# Patient Record
Sex: Female | Born: 1960 | Race: White | Hispanic: No | Marital: Married | Smoking: Never smoker
Health system: Southern US, Community
[De-identification: ages and names within clinical notes are randomized; demographics above are authoritative.]

## PROBLEM LIST (undated history)

## (undated) DIAGNOSIS — M26609 Unspecified temporomandibular joint disorder, unspecified side: Secondary | ICD-10-CM

## (undated) DIAGNOSIS — F419 Anxiety disorder, unspecified: Secondary | ICD-10-CM

## (undated) DIAGNOSIS — Z9289 Personal history of other medical treatment: Secondary | ICD-10-CM

## (undated) DIAGNOSIS — M1990 Unspecified osteoarthritis, unspecified site: Secondary | ICD-10-CM

## (undated) DIAGNOSIS — G479 Sleep disorder, unspecified: Secondary | ICD-10-CM

## (undated) DIAGNOSIS — IMO0001 Reserved for inherently not codable concepts without codable children: Secondary | ICD-10-CM

## (undated) DIAGNOSIS — M8580 Other specified disorders of bone density and structure, unspecified site: Secondary | ICD-10-CM

## (undated) HISTORY — DX: Unspecified temporomandibular joint disorder, unspecified side: M26.609

## (undated) HISTORY — PX: WISDOM TOOTH EXTRACTION: SHX21

## (undated) HISTORY — DX: Personal history of other medical treatment: Z92.89

## (undated) HISTORY — DX: Other specified disorders of bone density and structure, unspecified site: M85.80

## (undated) HISTORY — DX: Unspecified osteoarthritis, unspecified site: M19.90

## (undated) HISTORY — PX: BREAST SURGERY: SHX581

## (undated) HISTORY — DX: Sleep disorder, unspecified: G47.9

## (undated) HISTORY — DX: Anxiety disorder, unspecified: F41.9

## (undated) HISTORY — PX: MOLE REMOVAL: SHX2046

## (undated) HISTORY — DX: Reserved for inherently not codable concepts without codable children: IMO0001

---

## 2002-03-13 ENCOUNTER — Encounter: Payer: Self-pay | Admitting: Obstetrics and Gynecology

## 2002-03-13 ENCOUNTER — Ambulatory Visit (HOSPITAL_COMMUNITY): Admission: RE | Admit: 2002-03-13 | Discharge: 2002-03-13 | Payer: Self-pay | Admitting: Obstetrics and Gynecology

## 2002-05-26 ENCOUNTER — Inpatient Hospital Stay (HOSPITAL_COMMUNITY): Admission: AD | Admit: 2002-05-26 | Discharge: 2002-05-26 | Payer: Self-pay | Admitting: Obstetrics and Gynecology

## 2002-08-08 ENCOUNTER — Inpatient Hospital Stay (HOSPITAL_COMMUNITY): Admission: AD | Admit: 2002-08-08 | Discharge: 2002-08-10 | Payer: Self-pay | Admitting: Obstetrics and Gynecology

## 2003-10-31 ENCOUNTER — Other Ambulatory Visit: Admission: RE | Admit: 2003-10-31 | Discharge: 2003-10-31 | Payer: Self-pay | Admitting: Obstetrics and Gynecology

## 2004-04-02 ENCOUNTER — Encounter: Admission: RE | Admit: 2004-04-02 | Discharge: 2004-04-02 | Payer: Self-pay | Admitting: Obstetrics and Gynecology

## 2004-04-04 ENCOUNTER — Encounter: Payer: Self-pay | Admitting: Internal Medicine

## 2004-11-11 ENCOUNTER — Other Ambulatory Visit: Admission: RE | Admit: 2004-11-11 | Discharge: 2004-11-11 | Payer: Self-pay | Admitting: Obstetrics and Gynecology

## 2005-01-13 ENCOUNTER — Ambulatory Visit: Payer: Self-pay | Admitting: Internal Medicine

## 2005-05-05 ENCOUNTER — Encounter: Admission: RE | Admit: 2005-05-05 | Discharge: 2005-05-05 | Payer: Self-pay | Admitting: Obstetrics and Gynecology

## 2005-09-02 ENCOUNTER — Ambulatory Visit: Payer: Self-pay | Admitting: Internal Medicine

## 2005-11-13 ENCOUNTER — Other Ambulatory Visit: Admission: RE | Admit: 2005-11-13 | Discharge: 2005-11-13 | Payer: Self-pay | Admitting: Obstetrics and Gynecology

## 2006-01-11 ENCOUNTER — Ambulatory Visit: Payer: Self-pay | Admitting: Internal Medicine

## 2006-01-20 ENCOUNTER — Ambulatory Visit: Payer: Self-pay | Admitting: Internal Medicine

## 2006-02-08 ENCOUNTER — Ambulatory Visit: Payer: Self-pay | Admitting: Gastroenterology

## 2006-11-24 ENCOUNTER — Ambulatory Visit: Payer: Self-pay | Admitting: Internal Medicine

## 2006-11-24 LAB — CONVERTED CEMR LAB
ALT: 16 units/L (ref 0–40)
AST: 23 units/L (ref 0–37)
Albumin: 4.1 g/dL (ref 3.5–5.2)
Alkaline Phosphatase: 31 units/L — ABNORMAL LOW (ref 39–117)
BUN: 17 mg/dL (ref 6–23)
Basophils Absolute: 0 10*3/uL (ref 0.0–0.1)
Basophils Relative: 0.3 % (ref 0.0–1.0)
CO2: 26 meq/L (ref 19–32)
Calcium: 9.1 mg/dL (ref 8.4–10.5)
Chloride: 106 meq/L (ref 96–112)
Chol/HDL Ratio, serum: 2.5
Cholesterol: 144 mg/dL (ref 0–200)
Creatinine, Ser: 0.8 mg/dL (ref 0.4–1.2)
Eosinophil percent: 2.2 % (ref 0.0–5.0)
GFR calc non Af Amer: 82 mL/min
Glomerular Filtration Rate, Af Am: 100 mL/min/{1.73_m2}
Glucose, Bld: 80 mg/dL (ref 70–99)
HCT: 40.4 % (ref 36.0–46.0)
HDL: 56.9 mg/dL (ref >39.0)
Hemoglobin: 13.6 g/dL (ref 12.0–15.0)
LDL Cholesterol: 78 mg/dL (ref 0–99)
Lymphocytes Relative: 24.9 % (ref 12.0–46.0)
MCHC: 33.7 g/dL (ref 30.0–36.0)
MCV: 98.3 fL (ref 78.0–100.0)
Monocytes Absolute: 0.4 10*3/uL (ref 0.2–0.7)
Monocytes Relative: 7.5 % (ref 3.0–11.0)
Neutro Abs: 3.3 10*3/uL (ref 1.4–7.7)
Neutrophils Relative %: 65.1 % (ref 43.0–77.0)
Platelets: 217 10*3/uL (ref 150–400)
Potassium: 3.5 meq/L (ref 3.5–5.1)
RBC: 4.11 M/uL (ref 3.87–5.11)
RDW: 11.7 % (ref 11.5–14.6)
Sodium: 138 meq/L (ref 135–145)
TSH: 2.06 microintl units/mL (ref 0.35–5.50)
Total Bilirubin: 0.9 mg/dL (ref 0.3–1.2)
Total Protein: 6.8 g/dL (ref 6.0–8.3)
Triglyceride fasting, serum: 44 mg/dL (ref 0–149)
VLDL: 9 mg/dL (ref 0–40)
WBC: 5 10*3/uL (ref 4.5–10.5)

## 2006-11-30 ENCOUNTER — Ambulatory Visit: Payer: Self-pay | Admitting: Internal Medicine

## 2007-01-20 ENCOUNTER — Encounter
Admission: RE | Admit: 2007-01-20 | Discharge: 2007-03-09 | Payer: Self-pay | Admitting: Physical Medicine and Rehabilitation

## 2007-03-30 ENCOUNTER — Encounter: Admission: RE | Admit: 2007-03-30 | Discharge: 2007-03-30 | Payer: Self-pay | Admitting: Obstetrics and Gynecology

## 2007-05-03 ENCOUNTER — Ambulatory Visit: Payer: Self-pay | Admitting: Internal Medicine

## 2007-09-16 ENCOUNTER — Telehealth: Payer: Self-pay | Admitting: Internal Medicine

## 2007-09-28 ENCOUNTER — Ambulatory Visit: Payer: Self-pay | Admitting: Internal Medicine

## 2007-09-28 DIAGNOSIS — M26629 Arthralgia of temporomandibular joint, unspecified side: Secondary | ICD-10-CM

## 2007-09-28 DIAGNOSIS — M26649 Arthritis of unspecified temporomandibular joint: Secondary | ICD-10-CM | POA: Insufficient documentation

## 2007-09-28 DIAGNOSIS — K089 Disorder of teeth and supporting structures, unspecified: Secondary | ICD-10-CM | POA: Insufficient documentation

## 2007-09-28 DIAGNOSIS — F4323 Adjustment disorder with mixed anxiety and depressed mood: Secondary | ICD-10-CM | POA: Insufficient documentation

## 2007-09-28 DIAGNOSIS — F419 Anxiety disorder, unspecified: Secondary | ICD-10-CM

## 2007-09-28 DIAGNOSIS — F411 Generalized anxiety disorder: Secondary | ICD-10-CM | POA: Insufficient documentation

## 2007-10-25 ENCOUNTER — Ambulatory Visit: Payer: Self-pay | Admitting: Internal Medicine

## 2008-05-03 ENCOUNTER — Ambulatory Visit: Payer: Self-pay | Admitting: Internal Medicine

## 2008-05-03 DIAGNOSIS — N915 Oligomenorrhea, unspecified: Secondary | ICD-10-CM | POA: Insufficient documentation

## 2008-05-03 LAB — CONVERTED CEMR LAB: Vit D, 1,25-Dihydroxy: 28 — ABNORMAL LOW (ref 30–89)

## 2008-05-11 LAB — CONVERTED CEMR LAB
BUN: 14 mg/dL (ref 6–23)
Basophils Absolute: 0 10*3/uL (ref 0.0–0.1)
Basophils Relative: 0.2 % (ref 0.0–1.0)
CO2: 30 meq/L (ref 19–32)
Calcium: 9.7 mg/dL (ref 8.4–10.5)
Chloride: 105 meq/L (ref 96–112)
Creatinine, Ser: 0.8 mg/dL (ref 0.4–1.2)
Eosinophils Absolute: 0.1 10*3/uL (ref 0.0–0.7)
Eosinophils Relative: 1.3 % (ref 0.0–5.0)
FSH: 33.9 milliintl units/mL
Free T4: 0.8 ng/dL (ref 0.6–1.6)
GFR calc Af Amer: 99 mL/min
GFR calc non Af Amer: 82 mL/min
Glucose, Bld: 90 mg/dL (ref 70–99)
HCT: 40.3 % (ref 36.0–46.0)
Hemoglobin: 13.8 g/dL (ref 12.0–15.0)
Lymphocytes Relative: 30.3 % (ref 12.0–46.0)
MCHC: 34.2 g/dL (ref 30.0–36.0)
MCV: 94.5 fL (ref 78.0–100.0)
Monocytes Absolute: 0.5 10*3/uL (ref 0.1–1.0)
Monocytes Relative: 8.3 % (ref 3.0–12.0)
Neutro Abs: 3.4 10*3/uL (ref 1.4–7.7)
Neutrophils Relative %: 59.9 % (ref 43.0–77.0)
Platelets: 215 10*3/uL (ref 150–400)
Potassium: 3.9 meq/L (ref 3.5–5.1)
RBC: 4.26 M/uL (ref 3.87–5.11)
RDW: 12.4 % (ref 11.5–14.6)
Sodium: 140 meq/L (ref 135–145)
TSH: 2.83 microintl units/mL (ref 0.35–5.50)
WBC: 5.8 10*3/uL (ref 4.5–10.5)

## 2008-05-16 ENCOUNTER — Encounter: Payer: Self-pay | Admitting: Internal Medicine

## 2008-05-16 ENCOUNTER — Ambulatory Visit: Payer: Self-pay | Admitting: Internal Medicine

## 2008-05-30 ENCOUNTER — Ambulatory Visit: Payer: Self-pay | Admitting: Internal Medicine

## 2008-07-30 ENCOUNTER — Telehealth (INDEPENDENT_AMBULATORY_CARE_PROVIDER_SITE_OTHER): Payer: Self-pay | Admitting: *Deleted

## 2008-09-26 ENCOUNTER — Ambulatory Visit: Payer: Self-pay | Admitting: Internal Medicine

## 2008-09-26 DIAGNOSIS — F5101 Primary insomnia: Secondary | ICD-10-CM | POA: Insufficient documentation

## 2008-09-26 DIAGNOSIS — F5104 Psychophysiologic insomnia: Secondary | ICD-10-CM

## 2008-10-01 ENCOUNTER — Telehealth: Payer: Self-pay | Admitting: Internal Medicine

## 2008-10-20 ENCOUNTER — Ambulatory Visit: Payer: Self-pay | Admitting: Family Medicine

## 2008-10-20 DIAGNOSIS — J069 Acute upper respiratory infection, unspecified: Secondary | ICD-10-CM | POA: Insufficient documentation

## 2008-10-22 ENCOUNTER — Telehealth: Payer: Self-pay | Admitting: Internal Medicine

## 2009-01-23 ENCOUNTER — Telehealth: Payer: Self-pay | Admitting: Internal Medicine

## 2009-04-02 ENCOUNTER — Telehealth: Payer: Self-pay | Admitting: Internal Medicine

## 2009-04-09 ENCOUNTER — Ambulatory Visit: Payer: Self-pay | Admitting: Internal Medicine

## 2009-04-09 LAB — CONVERTED CEMR LAB
ALT: 18 units/L (ref 0–35)
AST: 24 units/L (ref 0–37)
Albumin: 3.8 g/dL (ref 3.5–5.2)
Alkaline Phosphatase: 39 units/L (ref 39–117)
BUN: 12 mg/dL (ref 6–23)
Basophils Absolute: 0 10*3/uL (ref 0.0–0.1)
Basophils Relative: 0.7 % (ref 0.0–3.0)
Bilirubin Urine: NEGATIVE
Bilirubin, Direct: 0.1 mg/dL (ref 0.0–0.3)
CO2: 31 meq/L (ref 19–32)
Calcium: 8.6 mg/dL (ref 8.4–10.5)
Chloride: 110 meq/L (ref 96–112)
Cholesterol: 144 mg/dL (ref 0–200)
Creatinine, Ser: 0.8 mg/dL (ref 0.4–1.2)
Eosinophils Absolute: 0.1 10*3/uL (ref 0.0–0.7)
Eosinophils Relative: 3 % (ref 0.0–5.0)
GFR calc non Af Amer: 81.48 mL/min (ref >60)
Glucose, Bld: 74 mg/dL (ref 70–99)
HCT: 38.4 % (ref 36.0–46.0)
HDL: 56.5 mg/dL (ref >39.00)
Hemoglobin: 13.2 g/dL (ref 12.0–15.0)
Ketones, urine, test strip: NEGATIVE
LDL Cholesterol: 77 mg/dL (ref 0–99)
Lymphocytes Relative: 31.2 % (ref 12.0–46.0)
Lymphs Abs: 1.4 10*3/uL (ref 0.7–4.0)
MCHC: 34.3 g/dL (ref 30.0–36.0)
MCV: 97.6 fL (ref 78.0–100.0)
Monocytes Absolute: 0.4 10*3/uL (ref 0.1–1.0)
Monocytes Relative: 8.3 % (ref 3.0–12.0)
Neutro Abs: 2.6 10*3/uL (ref 1.4–7.7)
Neutrophils Relative %: 56.8 % (ref 43.0–77.0)
Nitrite: NEGATIVE
Platelets: 201 10*3/uL (ref 150.0–400.0)
Potassium: 3.8 meq/L (ref 3.5–5.1)
RBC: 3.93 M/uL (ref 3.87–5.11)
RDW: 12.3 % (ref 11.5–14.6)
Sodium: 143 meq/L (ref 135–145)
Specific Gravity, Urine: 1.02
TSH: 2.61 microintl units/mL (ref 0.35–5.50)
Total Bilirubin: 1 mg/dL (ref 0.3–1.2)
Total CHOL/HDL Ratio: 3
Total Protein: 6.7 g/dL (ref 6.0–8.3)
Triglycerides: 55 mg/dL (ref 0.0–149.0)
Urobilinogen, UA: 0.2
VLDL: 11 mg/dL (ref 0.0–40.0)
WBC: 4.5 10*3/uL (ref 4.5–10.5)

## 2009-04-16 ENCOUNTER — Ambulatory Visit: Payer: Self-pay | Admitting: Internal Medicine

## 2009-04-24 ENCOUNTER — Encounter: Payer: Self-pay | Admitting: Internal Medicine

## 2009-06-21 ENCOUNTER — Ambulatory Visit: Payer: Self-pay | Admitting: Internal Medicine

## 2009-06-21 DIAGNOSIS — N390 Urinary tract infection, site not specified: Secondary | ICD-10-CM | POA: Insufficient documentation

## 2009-06-21 LAB — CONVERTED CEMR LAB
Glucose, Urine, Semiquant: NEGATIVE
Nitrite: NEGATIVE
Specific Gravity, Urine: 1.025

## 2009-09-03 ENCOUNTER — Telehealth: Payer: Self-pay | Admitting: *Deleted

## 2009-09-06 ENCOUNTER — Ambulatory Visit: Payer: Self-pay | Admitting: Internal Medicine

## 2009-09-10 ENCOUNTER — Encounter: Payer: Self-pay | Admitting: Internal Medicine

## 2009-09-20 ENCOUNTER — Ambulatory Visit: Payer: Self-pay | Admitting: Family Medicine

## 2009-09-20 DIAGNOSIS — K219 Gastro-esophageal reflux disease without esophagitis: Secondary | ICD-10-CM | POA: Insufficient documentation

## 2009-10-16 ENCOUNTER — Telehealth: Payer: Self-pay | Admitting: Internal Medicine

## 2010-03-14 ENCOUNTER — Telehealth: Payer: Self-pay | Admitting: Internal Medicine

## 2010-04-17 ENCOUNTER — Telehealth: Payer: Self-pay | Admitting: Internal Medicine

## 2010-04-23 ENCOUNTER — Ambulatory Visit: Payer: Self-pay | Admitting: Internal Medicine

## 2010-04-23 DIAGNOSIS — M542 Cervicalgia: Secondary | ICD-10-CM | POA: Insufficient documentation

## 2010-05-09 ENCOUNTER — Telehealth: Payer: Self-pay | Admitting: Internal Medicine

## 2010-05-28 LAB — CONVERTED CEMR LAB: Pap Smear: NORMAL

## 2010-06-17 ENCOUNTER — Ambulatory Visit: Payer: Self-pay | Admitting: Internal Medicine

## 2010-07-17 ENCOUNTER — Ambulatory Visit: Payer: Self-pay | Admitting: Internal Medicine

## 2010-07-17 LAB — CONVERTED CEMR LAB
ALT: 16 units/L (ref 0–35)
AST: 23 units/L (ref 0–37)
Albumin: 4.2 g/dL (ref 3.5–5.2)
Alkaline Phosphatase: 41 units/L (ref 39–117)
BUN: 17 mg/dL (ref 6–23)
Basophils Absolute: 0.1 10*3/uL (ref 0.0–0.1)
Basophils Relative: 1 % (ref 0.0–3.0)
Bilirubin Urine: NEGATIVE
Bilirubin, Direct: 0.2 mg/dL (ref 0.0–0.3)
Blood in Urine, dipstick: NEGATIVE
CO2: 30 meq/L (ref 19–32)
Calcium: 9.6 mg/dL (ref 8.4–10.5)
Chloride: 110 meq/L (ref 96–112)
Cholesterol: 190 mg/dL (ref 0–200)
Creatinine, Ser: 0.8 mg/dL (ref 0.4–1.2)
Eosinophils Absolute: 0.1 10*3/uL (ref 0.0–0.7)
Eosinophils Relative: 2.3 % (ref 0.0–5.0)
GFR calc non Af Amer: 82.23 mL/min (ref >60)
Glucose, Bld: 95 mg/dL (ref 70–99)
Glucose, Urine, Semiquant: NEGATIVE
HCT: 40.8 % (ref 36.0–46.0)
HDL: 66.8 mg/dL (ref >39.00)
Hemoglobin: 14.3 g/dL (ref 12.0–15.0)
Ketones, urine, test strip: NEGATIVE
LDL Cholesterol: 107 mg/dL — ABNORMAL HIGH (ref 0–99)
Lymphocytes Relative: 28.7 % (ref 12.0–46.0)
Lymphs Abs: 1.6 10*3/uL (ref 0.7–4.0)
MCHC: 35 g/dL (ref 30.0–36.0)
MCV: 96.7 fL (ref 78.0–100.0)
Monocytes Absolute: 0.5 10*3/uL (ref 0.1–1.0)
Monocytes Relative: 8.7 % (ref 3.0–12.0)
Neutro Abs: 3.4 10*3/uL (ref 1.4–7.7)
Neutrophils Relative %: 59.3 % (ref 43.0–77.0)
Nitrite: NEGATIVE
Platelets: 220 10*3/uL (ref 150.0–400.0)
Potassium: 5.2 meq/L — ABNORMAL HIGH (ref 3.5–5.1)
Protein, U semiquant: NEGATIVE
RBC: 4.23 M/uL (ref 3.87–5.11)
RDW: 13.5 % (ref 11.5–14.6)
Sodium: 144 meq/L (ref 135–145)
Specific Gravity, Urine: 1.02
TSH: 3.94 microintl units/mL (ref 0.35–5.50)
Total Bilirubin: 0.8 mg/dL (ref 0.3–1.2)
Total CHOL/HDL Ratio: 3
Total Protein: 7.2 g/dL (ref 6.0–8.3)
Triglycerides: 81 mg/dL (ref 0.0–149.0)
Urobilinogen, UA: 0.2
VLDL: 16.2 mg/dL (ref 0.0–40.0)
Vit D, 25-Hydroxy: 42 ng/mL (ref 30–89)
WBC Urine, dipstick: NEGATIVE
WBC: 5.7 10*3/uL (ref 4.5–10.5)
pH: 7

## 2010-07-22 ENCOUNTER — Ambulatory Visit: Payer: Self-pay | Admitting: Internal Medicine

## 2010-10-09 ENCOUNTER — Ambulatory Visit: Payer: Self-pay | Admitting: Internal Medicine

## 2010-10-09 ENCOUNTER — Encounter: Payer: Self-pay | Admitting: Internal Medicine

## 2010-10-20 ENCOUNTER — Telehealth: Payer: Self-pay | Admitting: *Deleted

## 2011-01-20 ENCOUNTER — Other Ambulatory Visit: Payer: Self-pay | Admitting: Obstetrics and Gynecology

## 2011-01-20 DIAGNOSIS — Z1239 Encounter for other screening for malignant neoplasm of breast: Secondary | ICD-10-CM

## 2011-01-20 DIAGNOSIS — Z1231 Encounter for screening mammogram for malignant neoplasm of breast: Secondary | ICD-10-CM

## 2011-01-25 LAB — CONVERTED CEMR LAB: Pap Smear: NORMAL

## 2011-01-27 NOTE — Assessment & Plan Note (Signed)
Summary: fu on meds/?eye/njr   Vital Signs:  Patient profile:   50 year old female Menstrual status:  perimenopausal LMP:     02/11/2010 Weight:      118 pounds Pulse rate:   60 / minute BP sitting:   100 / 70  (right arm) Cuff size:   regular  Vitals Entered By: Romualdo Bolk, CMA Duncan Dull) (April 23, 2010 10:49 AM) CC: Follow-up visit on meds- Pt would like a refill on ativan LMP (date): 02/11/2010 Menarche (age onset years): 62    Enter LMP: 02/11/2010 Last PAP Result normal   History of Present Illness: Christine Ford comesin comes in today  for follow up of eye rx   and doing well.   See Phone note .   had eye infection out of town and important to keep  infection from her son who is blind but some vision withone eye glaucomatous.  Requests refills of meds   if needed  Mood  : lexapro  anxiety  helps    ocass   forgets     not taking the ativan     but needs current  rx in case still taking the lunesta . Teeth still  a problem but coping.   Very busty family responsibilities.  Plans on follow up check up when possible. No new problems otherwise.   Preventive Screening-Counseling & Management  Alcohol-Tobacco     Alcohol drinks/day: <1     Alcohol type: wine     Smoking Status: never  Caffeine-Diet-Exercise     Caffeine use/day: 2     Does Patient Exercise: no  Current Medications (verified): 1)  Lunesta 3 Mg Tabs (Eszopiclone) .Marland Kitchen.. 1 By Mouth At Bedtime 2)  Lexapro 5 Mg Tabs (Escitalopram Oxalate) .... Take 1 Tablet By Mouth Once A Day 3)  Ativan 0.5 Mg Tabs (Lorazepam) .Marland Kitchen.. 1 By Mouth  Hs or Two Times A Day For  Sleep and Anxiety. 4)  Tums 500 Mg Chew (Calcium Carbonate Antacid)  Allergies (verified): No Known Drug Allergies  Past History:  Past medical, surgical, family and social histories (including risk factors) reviewed for relevance to current acute and chronic problems.  Past Medical History: Reviewed history from 09/26/2008 and no changes  required. 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   ? sleep phase  difficulty.   Past Surgical History: Reviewed history from 09/28/2007 and no changes required. Breast Implants  Past History:  Care Management: Gynecology: Dr. Senaida Ores Cardiology: remote consult [name] for cardiology Gastroenterology: [facility]    Family History: Reviewed history from 04/16/2009 and no changes required. cad father age 56 uncle age 83  see paper chart son with congenital glaucoma  has shunt in eye.  bipolar.   pgm 49 ? DVT    Family History High cholesterol Family History Hypertension    Social History: Reviewed history from 09/06/2009 and no changes required. Occupation: Armed forces operational officer  Married Never Smoked hh of 5       not alot of exercise . ocass wine   Some caffiene  water   issues in new house   used to have cats.  well water.    Review of Systems  The patient denies anorexia, fever, vision loss, decreased hearing, transient blindness, difficulty walking, enlarged lymph nodes, angioedema, and muscle weakness.         some neck pain at times  stiff at times  family hx of neck arthritis and no neuro problems or weakness   Physical Exam  General:  Well-developed,well-nourished,in no acute distress; alert,appropriate and cooperative throughout examination Head:  normocephalic and atraumatic.   Eyes:  vision grossly intact, pupils equal, and pupils round.  no sig discharge today    Ears:  R ear normal, L ear normal, and no external deformities.   Nose:  no nasal discharge.   Mouth:  pharynx pink and moist.   Neck:  no masses.  somestiffness and  postural foreshortening  Msk:  no focal atrophy  Neurologic:  alert & oriented X3, strength normal in all extremities, and gait normal.   Skin:  turgor normal and color normal.   Cervical Nodes:  No lymphadenopathy noted Psych:   Oriented X3, normally interactive, good eye contact, not anxious appearing, and not depressed appearing.  looks tired but nl cognition   Impression & Recommendations:  Problem # 1:  CONJUNCTIVITIS (ICD-372.30)  The following medications were removed from the medication list:    Polytrim 10000-0.1 Unit/ml-% Soln (Polymyxin b-trimethoprim) .Marland Kitchen... 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 1 by mouth at bedtime 2)  Lexapro 5 Mg Tabs (Escitalopram oxalate) .... Take 1 tablet by mouth once a day 3)  Ativan 0.5 Mg Tabs (Lorazepam) .Marland Kitchen.. 1 by mouth  hs or two times a day for  sleep and anxiety. 4)  Tums 500 Mg Chew (Calcium carbonate antacid)  Patient Instructions: 1)  ok to continue on the meds you ar on .    2)  no further rx on the eye. 3)  Look at  your posture and work stations regarding your neck.   Prescriptions: ATIVAN 0.5 MG TABS (LORAZEPAM) 1 by mouth  hs or two times a day for  sleep and anxiety.  #30 x 1   Entered and Authorized by:   Madelin Headings MD   Signed by:   Madelin Headings MD on 04/23/2010   Method used:   Print then Give to Patient   RxID:   [id number]

## 2011-01-27 NOTE — Assessment & Plan Note (Signed)
Summary: cpx/no pap/njr   Vital Signs:  Patient profile:   50 year old female Menstrual status:  perimenopausal Height:      64.5 inches Weight:      120 pounds Pulse rate:   66 / minute BP sitting:   120 / 80  (right arm) Cuff size:   regular  Vitals Entered By: Romualdo Bolk, CMA (AAMA) (July 22, 2010 9:13 AM) CC: CPX-no pap- Pt has a gyn   History of Present Illness: Christine Ford comes in today  for preventive visit .  Since last visit  here  there have been no major changes in health status       Stable on jaw  current regimen.  Some menopausal signs . Lunesta as needed  and   now not taking   for now in the summer  ...  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     Preventive Care Screening  Pap Smear:    Date:  05/28/2010    Results:  normal   Prior Values:    Pap Smear:  normal (10/29/2007)    Mammogram:  normal (12/28/2006)    Last Tetanus Booster:  Historical (12/03/2006)   Preventive Screening-Counseling & Management  Alcohol-Tobacco     Alcohol drinks/day: <1     Alcohol type: wine     Smoking Status: never  Caffeine-Diet-Exercise     Caffeine use/day: 2     Does Patient Exercise: no  Hep-HIV-STD-Contraception     Dental Visit-last 6 months yes     Sun Exposure-Excessive: no     Sun Exposure Counseling: use suncscreen  Safety-Violence-Falls     Seat Belt Use: 100     Smoke Detectors: yes  Current Medications (verified): 1)  Lunesta 3 Mg Tabs (Eszopiclone) .Marland Kitchen.. 1 By Mouth At Bedtime 2)  Lexapro 5 Mg Tabs (Escitalopram Oxalate) .Marland Kitchen.. 1 By Mouth Once Daily 3)  Tums 500 Mg Chew (Calcium Carbonate Antacid)  Allergies (verified): No Known Drug Allergies  Past History:  Past medical, surgical, family and social histories (including risk factors) reviewed, and no changes noted (except as noted below).  Past Medical History: Reviewed history from 09/26/2008 and no changes  required. 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   ? sleep phase  difficulty.   Past Surgical History: Reviewed history from 09/28/2007 and no changes required. Breast Implants  Past History:  Care Management: Gynecology: Dr. Senaida Ores Cardiology: remote consult [name] for cardiology Gastroenterology: [facility]    Family History: Reviewed history from 04/23/2010 and no changes required. cad father age 60 uncle age 33  see paper chart son with congenital glaucoma  has shunt in eye.  bipolar.   pgm 49 ? DVT   Son with vitiligo Family History High cholesterol Family History Hypertension    Social History: Reviewed history from 09/06/2009 and no changes required. Occupation: Armed forces operational officer  Married Never Smoked hh of 5       not alot of exercise . ocass wine   Some caffiene  water   issues in new house   used to have cats.  well water.  Dental Care w/in 6 mos.:  yes Sun Exposure-Excessive:  no  Review of Systems  The patient denies anorexia, fever, weight loss, weight gain, vision loss, decreased hearing, hoarseness, chest pain, syncope, dyspnea on  exertion, peripheral edema, prolonged cough, headaches, hemoptysis, abdominal pain, melena, hematochezia, severe indigestion/heartburn, hematuria, muscle weakness, difficulty walking, abnormal bleeding, enlarged lymph nodes, angioedema, and breast masses.         big toes  snaps out ans shoulder pop and crack.   Physical Exam  General:  Well-developed,well-nourished,in no acute distress; alert,appropriate and cooperative throughout examination Head:  normocephalic and atraumatic.   Eyes:  PERRL, EOMs full, conjunctiva clear  Ears:  R ear normal, L ear normal, and no external deformities.   Nose:  no nasal discharge.  no external deformity and no external erythema.   Mouth:  pharynx pink and moist.   Neck:  No  deformities, masses, or tenderness noted. Breasts:  No mass, nodules, thickening, tenderness, bulging, retraction, inflamation, nipple discharge or skin changes noted.    breast implants  Lungs:  Normal respiratory effort, chest expands symmetrically. Lungs are clear to auscultation, no crackles or wheezes. Heart:  Normal rate and regular rhythm. S1 and S2 normal without gallop, murmur, click, rub or other extra sounds.no lifts.   Abdomen:  Bowel sounds positive,abdomen soft and non-tender without masses, organomegaly or   noted. Genitalia:  per gyne Msk:  no joint swelling, no joint warmth, and no redness over joints.  toes look alighned  no atrophy Pulses:  pulses intact without delay   Extremities:  no clubbing cyanosis or edema  Neurologic:  alert & oriented X3, strength normal in all extremities, gait normal, and DTRs symmetrical and normal.   Skin:  no ecchymoses and no petechiae.   Cervical Nodes:  No lymphadenopathy noted Axillary Nodes:  No palpable lymphadenopathy Inguinal Nodes:  No significant adenopathy Psych:  Normal eye contact, appropriate affect. Cognition appears normal.  labs reviewed  copy to pt   Impression & Recommendations:  Problem # 1:  PREVENTIVE HEALTH CARE (ICD-V70.0) Discussed nutrition,exercise,diet,healthy weight, vitamin D and calcium. .   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    call if consider to restart medication   Complete Medication List: 1)  Lunesta 3 Mg Tabs (Eszopiclone) .Marland Kitchen.. 1 by mouth at bedtime 2)  Lexapro 5 Mg Tabs (Escitalopram oxalate) .Marland Kitchen.. 1 by mouth once daily 3)  Tums 500 Mg Chew (Calcium carbonate antacid)  Patient Instructions: 1)  check yearly    with labs   . 2)  call for refillls if needed  . 3)  continue healthy lifestyle intervention .

## 2011-01-27 NOTE — Progress Notes (Signed)
Summary: ? pink eye  Phone Note Call from Patient Call back at Home Phone [phone]   Caller: Patient Summary of Call: Pt is having pink eye out of town- Pt [facility] [place], [place] [phone]. Pt woke up this am both eyes were matted shut, eyes dry and scratchy. Left eye is red. No fever. Initial call taken by: Romualdo Bolk, CMA Duncan Dull),  April 17, 2010 10:43 AM  Follow-up for Phone Call        Per Dr. Fabian Sharp- Polytrim 1 gtt both eyes qid.  Rx called in and pt aware. Follow-up by: Romualdo Bolk, CMA (AAMA),  April 17, 2010 11:06 AM    New/Updated Medications: POLYTRIM 10000-0.1 UNIT/ML-% SOLN (POLYMYXIN B-TRIMETHOPRIM) 1 gtt in each eye qid Prescriptions: POLYTRIM 10000-0.1 UNIT/ML-% SOLN (POLYMYXIN B-TRIMETHOPRIM) 1 gtt in each eye qid  #1 x 0   Entered by:   Romualdo Bolk, CMA (AAMA)   Authorized by:   Madelin Headings MD   Signed by:   Romualdo Bolk, CMA (AAMA) on 04/17/2010   Method used:   Telephoned to ...       [facility]  [place] [address] (retail)       [address]       [place], [place]  [address]       Ph: [phone] or [phone]       Fax: [phone]   RxID:   [id number]

## 2011-01-27 NOTE — Miscellaneous (Signed)
Summary: BONE DENSITY  Clinical Lists Changes  Orders: Added new Test order of T-Bone Densitometry (77080) - Signed Added new Test order of T-Lumbar Vertebral Assessment (77082) - Signed 

## 2011-01-27 NOTE — Progress Notes (Signed)
Summary: refill on lunesta  Phone Note From Pharmacy   [facility]  [place] [phone]* Reason for Call: Needs renewal Details for Reason: Lunesta 3mg  Summary of Call: Last filled on 07/17/10 #30 Initial call taken by: Romualdo Bolk, CMA (AAMA),  October 20, 2010 10:59 AM  Follow-up for Phone Call        ok x 3 Follow-up by: Madelin Headings MD,  October 20, 2010 12:25 PM  Additional Follow-up for Phone Call Additional follow up Details #1::        Faxed back to pharmacy Additional Follow-up by: Romualdo Bolk, CMA Duncan Dull),  October 20, 2010 2:24 PM    Prescriptions: LUNESTA 3 MG TABS (ESZOPICLONE) 1 by mouth at bedtime  #30 x 2   Entered by:   Romualdo Bolk, CMA (AAMA)   Authorized by:   Madelin Headings MD   Signed by:   Romualdo Bolk, CMA (AAMA) on 10/20/2010   Method used:   Handwritten   RxID:   [id number]

## 2011-01-27 NOTE — Progress Notes (Signed)
Summary: refills  Phone Note Call from Patient   Caller: Patient Call For: Christine Headings MD Summary of Call: Pt is asking to refill Lunesta and Lexapro at [facility]) Pt number is [phone] Initial call taken by: [place] [facility] CMA,  March 14, 2010 9:15 AM  Follow-up for Phone Call        ok to refill each x 3   . schedule OV before refills run out. Follow-up by: Christine Headings MD,  March 14, 2010 11:08 AM    Prescriptions: LEXAPRO 5 MG TABS (ESCITALOPRAM OXALATE) Take 1 tablet by mouth once a day  #30 x 2   Entered by:   Lynann Beaver CMA   Authorized by:   Christine Headings MD   Signed by:   Lynann Beaver CMA on 03/14/2010   Method used:   Telephoned to ...       [facility]  [place] [address] (retail)       [address]       [place], [place]  [address]       Ph: [phone] or [phone]       Fax: [phone]   RxID:   [id number] LUNESTA 3 MG TABS (ESZOPICLONE) 1 by mouth at bedtime  #30 x 3   Entered by:   Lynann Beaver CMA   Authorized by:   Christine Headings MD   Signed by:   Lynann Beaver CMA on 03/14/2010   Method used:   Telephoned to ...       [facility]  [place] [address] (retail)       [address]       [place], [place]  [address]       Ph: [phone] or [phone]       Fax: [phone]   RxID:   [id number]

## 2011-01-27 NOTE — Progress Notes (Signed)
Summary: increase lexapro to 10mg   Phone Note Call from Patient Call back at Home Phone [phone]   Caller: Patient Summary of Call: Pt states that lexapro 5mg  is not effective and would like to increase to 10mg . Pt did take 2 of the 5mg  and it seemed to help more. Can we call in a rx for lexapro 10mg ? Initial call taken by: Romualdo Bolk, CMA Duncan Dull),  May 09, 2010 12:53 PM  Follow-up for Phone Call        Per Dr. Fabian Sharp- Okay to increase and have pt follow up in 3-4 weeks. Rx sent to pharmacy and left message on pt's machine about this.    New/Updated Medications: LEXAPRO 10 MG TABS (ESCITALOPRAM OXALATE) 1 by mouth once daily Prescriptions: LEXAPRO 10 MG TABS (ESCITALOPRAM OXALATE) 1 by mouth once daily  #30 x 0   Entered by:   Romualdo Bolk, CMA (AAMA)   Authorized by:   Madelin Headings MD   Signed by:   Romualdo Bolk, CMA (AAMA) on 05/09/2010   Method used:   Electronically to        [facility]  [place] [address] (retail)       [address]       [place], [place]  [address]       Ph: [phone] or [phone]       Fax: [phone]   RxID:   [id number]

## 2011-01-27 NOTE — Assessment & Plan Note (Signed)
Summary: 2 MTH F/U // RS   Vital Signs:  Patient profile:   50 year old female Menstrual status:  perimenopausal Height:      64.25 inches Weight:      118 pounds BMI:     20.17 Pulse rate:   72 / minute BP sitting:   110 / 60  (right arm) Cuff size:   regular  Vitals Entered By: Romualdo Bolk, CMA (AAMA) (June 17, 2010 2:13 PM) CC: Follow-up visit on lexapro- Pt states that 10mg  is too strong. It made her head feel foggy and it upset her stomach. Pt only takes it when she is under stress with the kids.   History of Present Illness: Christine Ford comes in today   for follow up  meds. See phone note  . tried  increase dosing   and burpy and had sedation  so stopped after a    couple days.   ortho dontic problem  is  stable   using bite guard  per orthodontist.  considering braces.  this helps . Thinks the sleep and jaw problem aggravates the anxiety when flares.  currently stable   Perimenopausal  per Gyne and this could contribute also.  NO new medical problems. Due to get a vit d done   per gyne but asks to get with out labs Using lunesta as needed gets bad tase but works well.   Preventive Screening-Counseling & Management  Alcohol-Tobacco     Alcohol drinks/day: <1     Alcohol type: wine     Smoking Status: never  Caffeine-Diet-Exercise     Caffeine use/day: 2     Does Patient Exercise: no  Current Medications (verified): 1)  Lunesta 3 Mg Tabs (Eszopiclone) .Marland Kitchen.. 1 By Mouth At Bedtime 2)  Lexapro 5 Mg Tabs (Escitalopram Oxalate) .Marland Kitchen.. 1 By Mouth Once Daily 3)  Tums 500 Mg Chew (Calcium Carbonate Antacid)  Allergies (verified): No Known Drug Allergies  Past History:  Past medical, surgical, family and social histories (including risk factors) reviewed for relevance to current acute and chronic problems.  Past Medical History: Reviewed history from 09/26/2008 and no changes required. 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   ? sleep phase  difficulty.   Past Surgical History: Reviewed history from 09/28/2007 and no changes required. Breast Implants  Past History:  Care Management: Gynecology: Dr. Senaida Ores Cardiology: remote consult [name] for cardiology [facility]astroenterology: [facility]    Family History: Reviewed history from 04/23/2010 and no changes required. cad father age 10 uncle age 51  see paper chart son with congenital glaucoma  has shunt in eye.  bipolar.   pgm 49 ? DVT    Family History High cholesterol Family History Hypertension    Social History: Reviewed history from 09/06/2009 and no changes required. Occupation: Armed forces operational officer  Married Never Smoked hh of 5       not alot of exercise . ocass wine   Some caffiene  water   issues in new house   used to have cats.  well water.    Review of Systems  The patient denies anorexia, fever, weight loss, weight gain, vision loss, prolonged cough, abdominal pain, difficulty walking, abnormal bleeding, and enlarged lymph nodes.    Physical Exam  General:  alert, well-developed, well-nourished, and well-hydrated.   Head:  normocephalic and atraumatic.   Eyes:  vision grossly intact,  pupils equal, and pupils round.   Neck:  no masses.  somestiffness and  postural foreshortening  Lungs:  Normal respiratory effort, chest expands symmetrically. Lungs are clear to auscultation, no crackles or wheezes. Heart:  Normal rate and regular rhythm. S1 and S2 normal without gallop, murmur, click, rub or other extra sounds. Abdomen:  Bowel sounds positive,abdomen soft and non-tender without masses, organomegaly or hernias noted. Pulses:  nl cap refill  Neurologic:  non focal  Psych:  Oriented X3, normally interactive, good eye contact, not anxious appearing, and not depressed appearing.  looks tired but nl cognition   Impression & Recommendations:  Problem # 1:  DSORD, ADJST  W/MIXED ANXIETY/DEPRESSED MOOD (ICD-309.28) Assessment Improved ok  to remain on the 5 mg    lexapro  if needed consider 7.5 mg  per day .   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  Problem # 5:  se of med  dose related   disc options  Complete Medication List: 1)  Lunesta 3 Mg Tabs (Eszopiclone) .Marland Kitchen.. 1 by mouth at bedtime 2)  Lexapro 5 Mg Tabs (Escitalopram oxalate) .Marland Kitchen.. 1 by mouth once daily 3)  Tums 500 Mg Chew (Calcium carbonate antacid)  Patient Instructions: 1)  ok to continue 5 mg of lexapro  2)  If needed consider trying 7.5 mg  dose. 3)  Optimize sleep and exercise .    4)  should get VIT D  level with next  cpx labs .dx osteopenia.

## 2011-02-02 ENCOUNTER — Ambulatory Visit
Admission: RE | Admit: 2011-02-02 | Discharge: 2011-02-02 | Disposition: A | Discharge status: Home / Self Care | Payer: BC Managed Care – PPO | Source: Ambulatory Visit | Attending: Obstetrics and Gynecology | Admitting: Obstetrics and Gynecology

## 2011-02-02 DIAGNOSIS — Z1231 Encounter for screening mammogram for malignant neoplasm of breast: Secondary | ICD-10-CM

## 2011-03-30 ENCOUNTER — Encounter: Payer: Self-pay | Admitting: Internal Medicine

## 2011-03-31 ENCOUNTER — Ambulatory Visit (INDEPENDENT_AMBULATORY_CARE_PROVIDER_SITE_OTHER): Payer: BC Managed Care – PPO | Admitting: Internal Medicine

## 2011-03-31 ENCOUNTER — Encounter: Payer: Self-pay | Admitting: Internal Medicine

## 2011-03-31 VITALS — BP 120/80 | HR 66 | Temp 98.8°F | Wt 117.0 lb

## 2011-03-31 DIAGNOSIS — H109 Unspecified conjunctivitis: Secondary | ICD-10-CM

## 2011-03-31 MED ORDER — POLYMYXIN B-TRIMETHOPRIM 10000-0.1 UNIT/ML-% OP SOLN: 1.0000 [drp] | OPHTHALMIC | Status: AC

## 2011-03-31 NOTE — Patient Instructions (Signed)
Continue compresses Antibiotic drops as discussed .  Expect improvement in 3-5 days .

## 2011-03-31 NOTE — Progress Notes (Signed)
  Subjective:    Patient ID: Christine Ford, female    DOB: 10-28-1961, 50 y.o.   MRN: [id number]  HPI Hx of dry eyes at times.However over the last day or two she's had more problems with her left eye. She felt like something was in it yesterday and used in eyewash which improved the problem however she has had some crusty discharge in the morning and continued redness. Denies pain or significant itching.  No fever or URI symptoms.. Pollen very thick  Recently but no allergy sx .  Concern of course is because her son who has a congenital glaucoma and limited vision and only one guy trying to avoid contagion.   Review of Systems No fever or respiratory infection see above    Objective:   Physical Exam Well-developed well-nourished and no acute distress with some mild left conjunctival redness. HEENT: Normocephalic ;atraumatic , Eyes;  PERRL, EOMs  Full   No photophobia   1 + redness on conjunctiva  Left and no FM seen No ciliary flush  Slight mucoid discharge ,,Ears: no deformities, canals nl, TM landmarks normal, Nose: no deformity or discharge  Mouth : OP clear without lesion or edema . NECK :no adenopathy .     Assessment & Plan:  Left conjunctivitis ? Reactive vs infection    REviewed  Findings  And diff dx    Compresses and can do empiric rx  Because o high risk situation with her son. Will continue  good hygiene as usual

## 2011-04-03 ENCOUNTER — Encounter: Payer: Self-pay | Admitting: Internal Medicine

## 2011-04-28 ENCOUNTER — Telehealth: Payer: Self-pay | Admitting: *Deleted

## 2011-04-28 NOTE — Telephone Encounter (Signed)
Pt called to state that she is doing a Walk Run program. She is going to work up to 3 miles in 8 weeks. They told her to let her primary care know about this.

## 2011-04-30 NOTE — Telephone Encounter (Signed)
Noted. No concerns.

## 2011-05-15 NOTE — Discharge Summary (Signed)
NAME:  Christine Ford, Christine Ford                        ACCOUNT NO.:  [id number]   MEDICAL RECORD NO.:  [id number]                   PATIENT TYPE:  INP   LOCATION:  9107                                 FACILITY:  [facility]   PHYSICIAN:  Malachi Pro. Ambrose Mantle, M.D.              DATE OF BIRTH:  Dec 19, 1961   DATE OF ADMISSION:  08/08/2002  DATE OF DISCHARGE:  08/10/2002                                 DISCHARGE SUMMARY   DISCHARGE DIAGNOSES:  1. Term pregnancy at 39+ weeks, delivered.  2. Advanced maternal age.  3. Status post normal spontaneous vaginal delivery.   DISCHARGE MEDICATIONS:  Motrin 600 mg p.o. q.6h.   DISCHARGE FOLLOWUP:  The patient is to follow up for routine postpartum  examination in six weeks.   HOSPITAL COURSE:  The patient is a 50 year old G3, P2-0-0-2 who is admitted  at 39+ weeks for induction due to favorable cervix.  Pregnancy had been  complicated by advanced maternal age; however, the patient declined  amniocentesis.  Her Down syndrome risk by triple screen was 1 out of 155.  There was also a family history of neural tube defect but the patient's  triple screen in respect to open neural tube defect was completely within  normal limits and the ultrasound also was within normal limits.   PRENATAL LABORATORIES:  O-.  Antibody negative.  RPR nonreactive.  Rubella  immune.  Hepatitis B surface antigen negative.  HIV declined.  GC negative.  Chlamydia negative.  GBS negative.   PAST OBSTETRIC HISTORY:  In 1996 she had normal spontaneous vaginal  delivery.  In 1999 she had normal spontaneous vaginal delivery.   PAST GYNECOLOGIC HISTORY:  None.   PAST SURGICAL HISTORY:  Breast implants in 1989.   PAST MEDICAL HISTORY:  None.   ALLERGIES:  No known drug allergies.   MEDICATIONS:  None.   PHYSICAL EXAMINATION:  VITAL SIGNS:  The patient was afebrile with stable  vital signs on admission.  Fetal heart rate was reactive.  ABDOMEN:  She was gravid and nontender.  PELVIC:   Cervix was 50% effaced, 1+ cm, and -2 station.   HOSPITAL COURSE:  Ruptured membranes was attempted with only several drops  of fluid obtained so the patient was begun on Pitocin per protocol.  She  began to progress throughout the day and when she reached 3 cm was felt to  have an intact four bag.  This was ruptured with a large amount of clear  fluid obtained.  Shortly thereafter the patient reached complete dilation.  Under epidural anesthesia pushed the vertex to a +3 station.  At that time  fetal heart rate was noted to be 80-90 with no recovery noted, deceleration  after her contraction.  Therefore, the KIWI self applied vacuum was applied  and the vertex easily brought to crowning with minimal traction.  The  patient then pushed the remainder of the head out without difficulty.  Apgars were 8 and 9.  Weight was 7 pounds 6 ounces.  There was terminal  meconium noted.  However, no nuchal cord.  The placenta did not separate  after 20 minutes so manual extraction was performed, then the uterus fundus  was confirmed clear of any placental fragments or trailing membranes.  Small  first degree laceration was repaired with 2-0 Vicryl for hemostasis and  estimated blood loss was 300 cc.  The patient then was very unsure regarding  her tubal ligation whether to proceed or not.  She was counseled extensively  as to the risks and benefits of the procedure and eventually declined to  continue and undergo that procedure.  She did very well postpartum and was  afebrile postpartum day two with no problems and was discharged home with  Motrin.     Huel Cote, NP                      Malachi Pro. Ambrose Mantle, M.D.    KR/MEDQ  D:  08/10/2002  T:  08/10/2002  Job:  [id number]

## 2011-05-15 NOTE — Assessment & Plan Note (Signed)
[facility]                                 ON-CALL NOTE   MUNIRAH, DOERNER                       MRN:          [id number]  DATE:07/23/2007                            DOB:          12-22-1961    Time received:  July 23, 2007, 12:41 p.m.  Patient:  Christine Ford.  Caller is the same.  She sees Dr. Fabian Sharp.  Date of birth:  July 31, 1960.  Telephone:  [phone].  Caller says that the whole family has head  lice.  We told her to purchase Rid over-the-counter shampoo, to use this  for everyone today and to repeat it again tomorrow.  She can follow up  as needed.     Tera Mater. Clent Ridges, MD  Electronically Signed    SAF/MedQ  DD: 07/23/2007  DT: 07/24/2007  Job #: [id number]

## 2011-06-16 ENCOUNTER — Encounter: Payer: Self-pay | Admitting: Internal Medicine

## 2011-06-16 ENCOUNTER — Ambulatory Visit (INDEPENDENT_AMBULATORY_CARE_PROVIDER_SITE_OTHER): Payer: BC Managed Care – PPO | Admitting: Internal Medicine

## 2011-06-16 VITALS — BP 108/68 | HR 56 | Temp 98.6°F | Resp 12 | Wt 116.5 lb

## 2011-06-16 DIAGNOSIS — R0789 Other chest pain: Secondary | ICD-10-CM

## 2011-06-16 DIAGNOSIS — M79629 Pain in unspecified upper arm: Secondary | ICD-10-CM

## 2011-06-16 DIAGNOSIS — M79609 Pain in unspecified limb: Secondary | ICD-10-CM

## 2011-06-16 DIAGNOSIS — R071 Chest pain on breathing: Secondary | ICD-10-CM

## 2011-06-16 NOTE — Patient Instructions (Addendum)
GET X RAY OF CHEST AND SHOULDER Take antiinflammatory doses  For 10-14 days.    2 aleve  2 x per day   If not better in about 2 weeks then call and we may do referral or other imaging.

## 2011-06-16 NOTE — Progress Notes (Signed)
  Subjective:    Patient ID: Christine Ford, female    DOB: 1961/09/17, 50 y.o.   MRN: [id number]  HPI Patient comes in for an acute problem. Insidious onset months ago of pain described as above right axillary radiating to side of breast and throbs and aches and aware daily but  Waxes an wanes and getting worse.   Worse with palpation and some at night  Not worse with acitivity . She is right-handed    No injuries   No numbness or weakness.  Tried advil  Prn   2 and no help. She is worried about breast problem normal mammogram. She doesn't feel a mass.  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  No weakness or  tremor of her right upper extremity  Perimenopausal  insomnia for which she takes Lunesta as needed. 2 mg  Rest of ros neg sob cough fever weight loss or adenopathy or bleeding  Past history family history social history reviewed in the electronic medical record.     Objective:   Physical Exam Well-developed well-nourished in no acute distress HEENT is grossly normal Neck some tenderness on the right side no acute findings. No adenopathy or supraclavicular nodes Chest:  Clear to A&P without wheezes rales or rhonchi CV:  S1-S2 no gallops or murmurs peripheral perfusion is normal Shoulder axilla  Tender near this area in axilla   No masses felt  Breast no obv masses No bony point tenderness Neuro grossly non focal  Abdomen:  Sof,t normal bowel sounds without hepatosplenomegaly, no guarding rebound or masses no CVA tenderness      Assessment & Plan:  Right axillary  Pain ? CW acts like nerve pain almost radiating  But exam normal.  Check chest x-ray and shoulder x-ray to try to delineate further cause of pain. Then plan follow up.

## 2011-06-18 ENCOUNTER — Telehealth: Payer: Self-pay | Admitting: *Deleted

## 2011-06-18 NOTE — Telephone Encounter (Signed)
Refill on Lunesta 3mg 

## 2011-06-18 NOTE — Telephone Encounter (Signed)
Call in #30 with no rf  

## 2011-06-19 MED ORDER — ESZOPICLONE 2 MG PO TABS: 2.0000 mg | ORAL_TABLET | Freq: Every evening | ORAL | Status: DC | PRN

## 2011-06-19 NOTE — Telephone Encounter (Signed)
rx sent to pharmacy

## 2011-06-22 ENCOUNTER — Ambulatory Visit (INDEPENDENT_AMBULATORY_CARE_PROVIDER_SITE_OTHER)
Admission: RE | Admit: 2011-06-22 | Discharge: 2011-06-22 | Disposition: A | Discharge status: Home / Self Care | Payer: BC Managed Care – PPO | Source: Ambulatory Visit | Attending: Internal Medicine | Admitting: Internal Medicine

## 2011-06-22 DIAGNOSIS — R0789 Other chest pain: Secondary | ICD-10-CM

## 2011-06-22 DIAGNOSIS — R071 Chest pain on breathing: Secondary | ICD-10-CM

## 2011-06-22 DIAGNOSIS — M79629 Pain in unspecified upper arm: Secondary | ICD-10-CM

## 2011-06-22 DIAGNOSIS — M79609 Pain in unspecified limb: Secondary | ICD-10-CM

## 2011-06-23 DIAGNOSIS — R0789 Other chest pain: Secondary | ICD-10-CM | POA: Insufficient documentation

## 2011-06-23 DIAGNOSIS — M79629 Pain in unspecified upper arm: Secondary | ICD-10-CM | POA: Insufficient documentation

## 2011-06-24 NOTE — Progress Notes (Signed)
Left message on machine about results. 

## 2011-06-25 ENCOUNTER — Telehealth: Payer: Self-pay | Admitting: *Deleted

## 2011-06-25 DIAGNOSIS — M79629 Pain in unspecified upper arm: Secondary | ICD-10-CM

## 2011-06-25 DIAGNOSIS — M542 Cervicalgia: Secondary | ICD-10-CM

## 2011-06-25 NOTE — Telephone Encounter (Signed)
Pt wants referral to Ortho for neck and shoulder pain.   Pt would like to referred to pulmonary because of the scarring in her lungs. Pt's mom has copd and lung cancer from a cousin.

## 2011-06-25 NOTE — Telephone Encounter (Signed)
Order sent to [facility] 

## 2011-06-25 NOTE — Telephone Encounter (Signed)
Ok to do both  May or may not need ct.

## 2011-06-29 ENCOUNTER — Telehealth: Payer: Self-pay | Admitting: Internal Medicine

## 2011-06-29 DIAGNOSIS — R9389 Abnormal findings on diagnostic imaging of other specified body structures: Secondary | ICD-10-CM

## 2011-06-29 MED ORDER — ESZOPICLONE 3 MG PO TABS: 3.0000 mg | ORAL_TABLET | Freq: Every day | ORAL | Status: DC

## 2011-06-29 NOTE — Telephone Encounter (Signed)
Left message on machine about the reason we sent in for the 2mg  instead of the 3mg . This was a mistake on our part and I'm going to re-send the rx for 3mg  to her pharmacy. Also that we need to get Dr. Rosezella [place] approval before we can refer her to pulmonary.

## 2011-06-29 NOTE — Telephone Encounter (Signed)
I already approved pulmonary referral.   (See last phone message response 0  Please send in referral.

## 2011-06-29 NOTE — Telephone Encounter (Signed)
Pt req a referral to pulmonary re: scarring on lungs. Also pt said that a script for Lunesta was sent in for 2 mg on 3 mg. Pt wants to know reason of dosage change?

## 2011-06-30 ENCOUNTER — Telehealth: Payer: Self-pay | Admitting: Internal Medicine

## 2011-06-30 DIAGNOSIS — M1990 Unspecified osteoarthritis, unspecified site: Secondary | ICD-10-CM

## 2011-06-30 NOTE — Telephone Encounter (Signed)
Order sent to [facility] 

## 2011-06-30 NOTE — Telephone Encounter (Signed)
Pt called to make an appt with Ortho. They told her to see if we can send over a referral to [facility] with Dr. Daphene Calamity. Order sent to [facility].

## 2011-06-30 NOTE — Telephone Encounter (Signed)
Wants referral to a rheumatologist regarding  arthritis. Not an ortho. Wants Carollee Herter to return call.

## 2011-07-28 ENCOUNTER — Encounter: Payer: Self-pay | Admitting: Critical Care Medicine

## 2011-07-28 ENCOUNTER — Ambulatory Visit (INDEPENDENT_AMBULATORY_CARE_PROVIDER_SITE_OTHER): Payer: BC Managed Care – PPO | Admitting: Critical Care Medicine

## 2011-07-28 VITALS — BP 120/70 | HR 78 | Temp 98.6°F | Ht 64.0 in | Wt 117.6 lb

## 2011-07-28 DIAGNOSIS — R0600 Dyspnea, unspecified: Secondary | ICD-10-CM

## 2011-07-28 DIAGNOSIS — R0609 Other forms of dyspnea: Secondary | ICD-10-CM

## 2011-07-28 DIAGNOSIS — R0789 Other chest pain: Secondary | ICD-10-CM

## 2011-07-28 DIAGNOSIS — R0989 Other specified symptoms and signs involving the circulatory and respiratory systems: Secondary | ICD-10-CM

## 2011-07-28 DIAGNOSIS — R071 Chest pain on breathing: Secondary | ICD-10-CM

## 2011-07-28 NOTE — Progress Notes (Signed)
Subjective:    Patient ID: Christine Ford, female    DOB: 11-07-1961, 50 y.o.   MRN: [id number]  Shortness of Breath This is a new problem. The current episode started more than 1 month ago. The problem occurs intermittently. The problem has been waxing and waning. Duration: will recover in seconds. Associated symptoms include neck pain. Pertinent negatives include no abdominal pain, chest pain, ear pain, fever, headaches, hemoptysis, leg pain, leg swelling, orthopnea, PND, rash, rhinorrhea, sore throat, sputum production, syncope, vomiting or wheezing. The symptoms are aggravated by eating (if eats cold material or cold water will trigger a spell, works as a Forensic psychologist, passive smoke exposure in mother, cousins, aunts growing up). Associated symptoms comments: R sided neck pain,  R shoulder pain.   Pain under the arm as well.  This ppt PCP appt. Has chronic indigestion and will tighten in shoulders Will have some reflux and heartburn. There is no history of allergies, asthma, bronchiolitis, CAD, chronic lung disease, COPD, DVT, a heart failure, PE or pneumonia.   50 y.o.WF Had CXR in arm/neck .  ?Abn CXR.  Hx of Lung Ca in family Now:  Notes sl change.  If coughs hard, was not able to breath, and could not pull air in and put head in freezer.  Got spastic, could not speak or breath.  Notes more regular.  Notices ? If chokes and will cough   Review of Systems  Constitutional: Positive for diaphoresis and fatigue. Negative for fever, chills, activity change, appetite change and unexpected weight change.  HENT: Positive for neck pain, neck stiffness and voice change. Negative for hearing loss, ear pain, nosebleeds, congestion, sore throat, facial swelling, rhinorrhea, sneezing, mouth sores, trouble swallowing, dental problem, postnasal drip, sinus pressure, tinnitus and ear discharge.   Eyes: Positive for discharge. Negative for photophobia, itching and visual disturbance.  Respiratory:  Positive for chest tightness. Negative for apnea, cough, hemoptysis, sputum production, choking, shortness of breath, wheezing and stridor.   Cardiovascular: Negative for chest pain, palpitations, orthopnea, leg swelling, syncope and PND.  Gastrointestinal: Negative for nausea, vomiting, abdominal pain, constipation, blood in stool and abdominal distention.  Genitourinary: Negative for dysuria, urgency, frequency, hematuria, flank pain, decreased urine volume and difficulty urinating.  Musculoskeletal: Positive for joint swelling. Negative for myalgias, back pain, arthralgias and gait problem.  Skin: Negative for color change, pallor and rash.  Neurological: Negative for dizziness, tremors, seizures, syncope, speech difficulty, weakness, light-headedness, numbness and headaches.  Hematological: Negative for adenopathy. Does not bruise/bleed easily.  Psychiatric/Behavioral: Positive for sleep disturbance. Negative for confusion and agitation. The patient is nervous/anxious.        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  Will awaken with poor nights sleep /fatigue. Had a dental issue, TMJ .      Hx of mold exposure in crawlspace.  Been in house 2.5 years.  Has interior and exterior drain.  House is 50yrs old.    Objective:   Physical Exam Filed Vitals:   07/28/11 1420  BP: 120/70  Pulse: 78  Temp: 98.6 F (37 C)  TempSrc: Oral  Height: 5\' 4"  (1.626 m)  Weight: 117 lb 9.6 oz (53.343 kg)  SpO2: 98%    Gen: Pleasant, well-nourished, in no distress,  normal affect  ENT: No lesions,  mouth clear,  oropharynx clear, no postnasal drip  Neck: No JVD, no TMG, no carotid bruits  Lungs: No use of accessory muscles, no dullness  to percussion, clear without rales or rhonchi  Cardiovascular: RRR, heart sounds normal, no murmur or gallops, no peripheral edema  Abdomen: soft and NT, no HSM,  BS normal  Musculoskeletal: No deformities, no cyanosis or  clubbing  Neuro: alert, non focal  Skin: Warm, no lesions or rashes  Xray reviewed:  Normal except mild apical scar 7/31: spiro: normal      Assessment & Plan:   Right-sided chest wall pain Hx of abnormal Chest xray.  Pt with elongated thorax, She has normal spirometry.  Chest pain is now resolved She likely had reflux Plan No further pulm w/u Pt given reassurance

## 2011-07-28 NOTE — Patient Instructions (Signed)
You do not have any significant lung disease Follow a reflux diet as best as possible Return as needed Make sure the mold issue is repaired in your home

## 2011-07-29 NOTE — Assessment & Plan Note (Signed)
Hx of abnormal Chest xray.  Pt with elongated thorax, She has normal spirometry.  Chest pain is now resolved She likely had reflux Plan No further pulm w/u Pt given reassurance

## 2011-08-13 ENCOUNTER — Encounter: Payer: Self-pay | Admitting: *Deleted

## 2011-08-13 ENCOUNTER — Encounter: Payer: BC Managed Care – PPO | Attending: Rheumatology | Admitting: *Deleted

## 2011-08-13 DIAGNOSIS — M129 Arthropathy, unspecified: Secondary | ICD-10-CM | POA: Insufficient documentation

## 2011-08-13 DIAGNOSIS — Z713 Dietary counseling and surveillance: Secondary | ICD-10-CM | POA: Insufficient documentation

## 2011-08-13 NOTE — Patient Instructions (Addendum)
Goals:   Incorporate more anti-inflammatory foods in your daily diet (see handout).  Check with Dr. Fabian Sharp regarding additional supplements (i.e. Curcumin, turmeric, chondroitin, etc).   Continue exercise regimen.  Increase fluid intake to 60-90 oz daily.

## 2011-08-13 NOTE — Progress Notes (Signed)
Medical Nutrition Therapy:  Appt start time: 0900 end time:  1000.  Assessment:  Primary concerns today - Nutritional Consult re: Arthritis supplements/diet.  Pt is concerned about getting enough calcium in her diet and what foods are recommended for arthritis/osteopenia. States she just wants to make sure she is "on the right track" with her intake. Cancer and osteoarthritis are prevalent in her family. Dietary intake with appropriate choices and portions noted except for inadequate fluid intake.  Pt reports running 3x/week.  MEDICATIONS: Lunesta (prn),    DIETARY INTAKE:  Usual eating pattern includes 3 meals and 3 snacks per day.  24-hr recall: (~16 oz water day) B ( AM): Ezekiel bread w/ flax and LS jam; coffee  Snk ( AM): banana  L ( PM): Chobani (pineapple or peach); water Snk ( PM): banana or pretzels D ( PM): Chicken, broccoli, zucchini, bread (WW or rye); tacos, spaghetti (whole wheat or white pasta-alternates);  Snk ( PM): brownies or sweets; hummus w/ tostitos; 4 oz coke  Usual physical activity: Runs 3 miles 3 days/wk - 45 min.   Estimated energy needs: 1400 calories 175 g carbohydrates 70 g protein 45 g fat  Progress Towards Goal(s):  NEW.   Nutritional Diagnosis:  NB-1.1 Food and nutrition-related knowledge deficit related to anti-inflammatory foods as evidenced by patient-requested referral for nutritional education for appropriate foods for arthritis dx.    Intervention/Goals:   Incorporate more anti-inflammatory foods in your daily diet (see handout).  Check with Dr. Fabian Sharp regarding additional supplements (i.e. Curcumin, turmeric, chondroitin, etc).   Continue exercise regimen.  Increase fluid intake to 60-90 oz daily.  Monitoring/Evaluation:  Dietary intake and exercise prn.

## 2011-08-24 ENCOUNTER — Telehealth: Payer: Self-pay | Admitting: *Deleted

## 2011-08-24 MED ORDER — ESZOPICLONE 2 MG PO TABS: 2.0000 mg | ORAL_TABLET | Freq: Every evening | ORAL | Status: DC | PRN

## 2011-08-24 NOTE — Telephone Encounter (Signed)
Per Dr. Fabian Sharp- ok x 2. Rx sent via fax.

## 2011-10-15 ENCOUNTER — Ambulatory Visit: Payer: BC Managed Care – PPO | Admitting: Internal Medicine

## 2011-10-23 ENCOUNTER — Other Ambulatory Visit (INDEPENDENT_AMBULATORY_CARE_PROVIDER_SITE_OTHER): Payer: BC Managed Care – PPO

## 2011-10-23 DIAGNOSIS — Z0000 Encounter for general adult medical examination without abnormal findings: Secondary | ICD-10-CM

## 2011-10-23 LAB — CBC WITH DIFFERENTIAL/PLATELET
Basophils Absolute: 0 10*3/uL (ref 0.0–0.1)
Basophils Relative: 0.6 % (ref 0.0–3.0)
Eosinophils Relative: 2.8 % (ref 0.0–5.0)
HCT: 40.9 % (ref 36.0–46.0)
Hemoglobin: 13.9 g/dL (ref 12.0–15.0)
Lymphocytes Relative: 31.2 % (ref 12.0–46.0)
Lymphs Abs: 1.5 10*3/uL (ref 0.7–4.0)
Monocytes Relative: 8.6 % (ref 3.0–12.0)
Neutro Abs: 2.7 10*3/uL (ref 1.4–7.7)
RBC: 4.25 Mil/uL (ref 3.87–5.11)
RDW: 12.8 % (ref 11.5–14.6)

## 2011-10-23 LAB — POCT URINALYSIS DIPSTICK
Bilirubin, UA: NEGATIVE
Glucose, UA: NEGATIVE
Ketones, UA: NEGATIVE
Protein, UA: NEGATIVE
Spec Grav, UA: 1.025

## 2011-10-23 LAB — BASIC METABOLIC PANEL
Calcium: 9.3 mg/dL (ref 8.4–10.5)
GFR: 89.61 mL/min (ref >60.00)
Glucose, Bld: 84 mg/dL (ref 70–99)
Potassium: 3.8 mEq/L (ref 3.5–5.1)
Sodium: 142 mEq/L (ref 135–145)

## 2011-10-23 LAB — HEPATIC FUNCTION PANEL
ALT: 18 U/L (ref 0–35)
AST: 27 U/L (ref 0–37)
Albumin: 4.4 g/dL (ref 3.5–5.2)
Alkaline Phosphatase: 40 U/L (ref 39–117)
Total Protein: 7.3 g/dL (ref 6.0–8.3)

## 2011-10-23 LAB — LIPID PANEL: Cholesterol: 180 mg/dL (ref 0–200)

## 2011-10-23 LAB — TSH: TSH: 2.04 u[IU]/mL (ref 0.35–5.50)

## 2011-10-27 ENCOUNTER — Other Ambulatory Visit: Payer: BC Managed Care – PPO

## 2011-11-10 ENCOUNTER — Encounter: Payer: BC Managed Care – PPO | Admitting: Internal Medicine

## 2011-11-23 ENCOUNTER — Ambulatory Visit (INDEPENDENT_AMBULATORY_CARE_PROVIDER_SITE_OTHER): Payer: BC Managed Care – PPO | Admitting: Internal Medicine

## 2011-11-23 ENCOUNTER — Encounter: Payer: Self-pay | Admitting: Internal Medicine

## 2011-11-23 VITALS — BP 120/80 | HR 60 | Ht 64.0 in | Wt 117.0 lb

## 2011-11-23 DIAGNOSIS — Z0000 Encounter for general adult medical examination without abnormal findings: Secondary | ICD-10-CM

## 2011-11-23 DIAGNOSIS — M79629 Pain in unspecified upper arm: Secondary | ICD-10-CM

## 2011-11-23 DIAGNOSIS — G479 Sleep disorder, unspecified: Secondary | ICD-10-CM

## 2011-11-23 DIAGNOSIS — M79609 Pain in unspecified limb: Secondary | ICD-10-CM

## 2011-11-23 DIAGNOSIS — M26629 Arthralgia of temporomandibular joint, unspecified side: Secondary | ICD-10-CM

## 2011-11-23 DIAGNOSIS — K089 Disorder of teeth and supporting structures, unspecified: Secondary | ICD-10-CM

## 2011-11-23 MED ORDER — ESZOPICLONE 2 MG PO TABS: 2.0000 mg | ORAL_TABLET | Freq: Every evening | ORAL | Status: DC | PRN

## 2011-11-23 NOTE — Patient Instructions (Signed)
Continue lifestyle intervention healthy eating and exercise . return office visit in 6 months or check up in a year depending on how you are doing.

## 2011-11-23 NOTE — Assessment & Plan Note (Signed)
better 

## 2011-11-23 NOTE — Progress Notes (Signed)
Subjective:    Patient ID: Christine Ford, female    DOB: 1961/02/03, 50 y.o.   MRN: [id number]  HPI Patient comes in today for preventive visit and follow-up of medical issues. Update of her history since her last visit.  Sleep ; still an issues takes meds a couple times a week.   Needs refill for now no sig se of meds  Right arm axillary pain some better but is reassure this is not pulmonary or breast related  Runs ocass when can  No sx or exercise intolerance . Getting a   Mouth  Insert  . Review of Systems ROS:  GEN/ HEENTNo fever, significant weight changes sweats headaches vision problems hearing changes, CV/ PULM; No chest pain shortness of breath cough, syncope,edema  change in exercise tolerance. GI /GU: No adominal pain, vomiting, change in bowel habits. No blood in the stool. No significant GU symptoms. SKIN/HEME: ,no acute skin rashes suspicious lesions or bleeding. No lymphadenopathy, nodules, masses.  NEURO/ PSYCH:  No neurologic signs such as weakness numbness No depression . IMM/ Allergy: No unusual infections.  Allergy .   REST of 12 system review negative   Past history family history social history reviewed in the electronic medical record.     Objective:   Physical Exam Physical Exam: Vital signs reviewed XBM:WUXL is a well-developed well-nourished alert cooperative  white female who appears her stated age in no acute distress.  HEENT: normocephalic  traumatic , Eyes: PERRL EOM's full, conjunctiva clear, Nares: paten,t no deformity discharge or tenderness., Ears: no deformity EAC's clear TMs with normal landmarks. Mouth: clear OP, no lesions, edema.  Moist mucous membranes. Dentition in adequate repair. NECK: supple without masses, thyromegaly or bruits. CHEST/PULM:  Clear to auscultation and percussion breath sounds equal no wheeze , rales or rhonchi. No chest wall deformities or tenderness. Breast: normal by inspection . No dimpling, discharge, masses,  tenderness or discharge . Implants no nodules LN: no cervical axillary inguinal adenopathy CV: PMI is nondisplaced, S1 S2 no gallops, murmurs, rubs. Peripheral pulses are full without delay.No JVD .  ABDOMEN: Bowel sounds normal nontender  No guard or rebound, no hepato splenomegal no CVA tenderness.  No hernia. Extremtities:  No clubbing cyanosis or edema, no acute joint swelling or redness no focal atrophy NEURO:  Oriented x3, cranial nerves 3-12 appear to be intact, no obvious focal weakness,gait within normal limits no abnormal reflexes or asymmetrical SKIN: No acute rashes normal turgor, color, no bruising or petechiae. PSYCH: Oriented, good eye contact, no obvious depression anxiety, cognition and judgment appear normal.  Lab Results  Component Value Date   WBC 4.8 10/23/2011   HGB 13.9 10/23/2011   HCT 40.9 10/23/2011   PLT 202.0 10/23/2011   GLUCOSE 84 10/23/2011   CHOL 180 10/23/2011   TRIG 86.0 10/23/2011   HDL 68.10 10/23/2011   LDLCALC 95 10/23/2011   ALT 18 10/23/2011   AST 27 10/23/2011   NA 142 10/23/2011   K 3.8 10/23/2011   CL 107 10/23/2011   CREATININE 0.7 10/23/2011   BUN 17 10/23/2011   CO2 28 10/23/2011   TSH 2.04 10/23/2011         Assessment & Plan:  Preventive Health Care Counseled regarding healthy nutrition, exercise, sleep, injury prevention, calcium vit d and healthy weight . Up to date  on healthcare parameters per hx  Sleep disorder   Disc  mds ok for now tmj dental  Issue   Under  Care  Affecting  sleep Axillary pain cwp better

## 2011-11-23 NOTE — Assessment & Plan Note (Signed)
Ok to take lunesta prn samples and rx given  Risk benefit of medication discussed.

## 2012-02-19 ENCOUNTER — Other Ambulatory Visit: Payer: Self-pay | Admitting: *Deleted

## 2012-02-19 MED ORDER — ESZOPICLONE 2 MG PO TABS: 2.0000 mg | ORAL_TABLET | Freq: Every evening | ORAL | Status: DC | PRN

## 2012-02-19 NOTE — Telephone Encounter (Signed)
Refill on lunesta last filled on 11/23/11 LOV 11/23/11 NOV none

## 2012-02-19 NOTE — Telephone Encounter (Signed)
Ok x 2  

## 2012-02-19 NOTE — Telephone Encounter (Signed)
Rx called in 

## 2012-02-24 ENCOUNTER — Telehealth: Payer: Self-pay | Admitting: Family Medicine

## 2012-02-24 NOTE — Telephone Encounter (Signed)
Patient's prior auth of Alfonso Patten will be denied unless she has tried: zolpidem or zaleplon. Neither come up in her chart. Looks like in EMR she was Rx'd [place] at her 09/26/2008 visit. Please advise as to if she's tried anything else. If not, she'll need to try one of the two above. Thanks.

## 2012-02-24 NOTE — Telephone Encounter (Signed)
Please give this message to patient  And ask what seh wants to do . I think that 14 day rx  As a trial is reasonable . However the med dosing in female is advised to be 5 mg

## 2012-02-25 MED ORDER — ZOLPIDEM TARTRATE 5 MG PO TABS: 5.0000 mg | ORAL_TABLET | Freq: Every evening | ORAL | Status: DC | PRN

## 2012-02-25 NOTE — Telephone Encounter (Signed)
Christine Ford, where are we at on this?  Can I throw this Prior Auth away? Is Dr. Fabian Sharp going to give her a trial of one of the generic alternatives? Thanks.

## 2012-02-25 NOTE — Telephone Encounter (Signed)
Pt is willing to try 14 days of ambien 5 mg. Rx called into the pharmacy.

## 2012-03-09 ENCOUNTER — Telehealth: Payer: Self-pay | Admitting: *Deleted

## 2012-03-09 NOTE — Telephone Encounter (Signed)
Pt called stating that she tried the [place] for 14 days and it didn't help. She is now wanting to go back to [place]. Can we refill this rx?

## 2012-03-09 NOTE — Telephone Encounter (Signed)
Per Dr. Fabian Sharp- ok to refill lunesta #30 with 2 refills

## 2012-03-10 MED ORDER — ESZOPICLONE 2 MG PO TABS: 2.0000 mg | ORAL_TABLET | Freq: Every evening | ORAL | Status: DC | PRN

## 2012-03-10 NOTE — Telephone Encounter (Signed)
Rx called in 

## 2012-03-11 NOTE — Telephone Encounter (Signed)
Christine Ford,  I did not see anything being requested from the pharmacy as far as a prior auth. However, I went ahead and submitted it to [facility] anyway, and the Lunesta HAS been approved. So if the pharmacy or the patient call you about it, please let them know. Thanks!

## 2012-03-14 ENCOUNTER — Telehealth: Payer: Self-pay | Admitting: Internal Medicine

## 2012-03-14 NOTE — Telephone Encounter (Signed)
Opened in error

## 2012-06-03 ENCOUNTER — Encounter: Payer: Self-pay | Admitting: Internal Medicine

## 2012-06-03 ENCOUNTER — Ambulatory Visit (INDEPENDENT_AMBULATORY_CARE_PROVIDER_SITE_OTHER): Payer: BC Managed Care – PPO | Admitting: Internal Medicine

## 2012-06-03 VITALS — BP 104/76 | HR 75 | Temp 98.0°F | Wt 114.0 lb

## 2012-06-03 DIAGNOSIS — Z803 Family history of malignant neoplasm of breast: Secondary | ICD-10-CM | POA: Insufficient documentation

## 2012-06-03 DIAGNOSIS — G479 Sleep disorder, unspecified: Secondary | ICD-10-CM

## 2012-06-03 DIAGNOSIS — Z719 Counseling, unspecified: Secondary | ICD-10-CM | POA: Insufficient documentation

## 2012-06-03 DIAGNOSIS — Z1239 Encounter for other screening for malignant neoplasm of breast: Secondary | ICD-10-CM

## 2012-06-03 DIAGNOSIS — K089 Disorder of teeth and supporting structures, unspecified: Secondary | ICD-10-CM

## 2012-06-03 NOTE — Progress Notes (Signed)
  Subjective:    Patient ID: Christine Ford, female    DOB: 25-Sep-1961, 51 y.o.   MRN: [id number]  HPI .pt comes in for ? About  Breast cancer screening and risk  Ask about 3 d mammogram  Has implants.  Saw this on Dr Neil Crouch .  also fam hx of uterine and breast cancer but not in first degree relative  Sibs are well.  Family hx of urterine and breast cancer.,   Moms  Side cousin  42  Died.  Moms  Side cousin  9  Breast cancer  Dying  Grand parents Grand aunt uterine cancer  No ovarian or colon.  positive for lung cancer x 2 in smokers A cousin/ female had breast cancer ?  Hx of jewish descent in the family.   Review of Systems No cp sob  To get orthodontic work regarding other .  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 Sleep lunesta  Would like to dc soon  Past history family history social history reviewed in the electronic medical record.     Objective:   Physical Exam BP 104/76  Pulse 75  Temp(Src) 98 F (36.7 C) (Oral)  Wt 114 lb (51.71 kg)  WDWN in nad  Looks well  No exam     Assessment & Plan:  Counseling about breast cancer screening  She should get reg mammo and can discuss other with  radiology but usually not insurance covered except screening.  Or new diagnostic techniques .  fam hx of breast cancer . These are mostly cousins and not a first degree relative however age  Of disease and number and hx of a female involved are higher risk .  She doesn't really meet criteria however   It may be best for her relatives to be tested instead .   HO downloaded from [employer] printed and  given .  If needed we can refer to genetic counseling if appropriate.    Risk benefit of screening.   Jaw  Dental pain  To get intervention eventually Total visit > 50% spent counseling and coordinating care

## 2012-06-03 NOTE — Patient Instructions (Addendum)
Review information  Get reg mammo call if need referral. Primary cases should et tested.   Could refer if wishes for genetic counseling. sched for cpx and labs and vit d in the fall .  Or as needed.

## 2012-07-11 ENCOUNTER — Telehealth: Payer: Self-pay | Admitting: Family Medicine

## 2012-07-11 NOTE — Telephone Encounter (Signed)
Last seen 06/03/12. Last filled:  03/10/12 No f/u  Please advise.

## 2012-07-11 NOTE — Telephone Encounter (Signed)
Ok to refill 30 x 2

## 2012-07-12 ENCOUNTER — Other Ambulatory Visit: Payer: Self-pay | Admitting: Family Medicine

## 2012-07-12 MED ORDER — ESZOPICLONE 2 MG PO TABS: 2.0000 mg | ORAL_TABLET | Freq: Every evening | ORAL | Status: DC | PRN

## 2012-07-12 NOTE — Telephone Encounter (Signed)
Called and left on pharmacy voicemail.

## 2012-08-25 ENCOUNTER — Encounter: Payer: Self-pay | Admitting: Gastroenterology

## 2012-09-25 ENCOUNTER — Other Ambulatory Visit: Payer: Self-pay | Admitting: Internal Medicine

## 2012-09-28 ENCOUNTER — Other Ambulatory Visit: Payer: Self-pay | Admitting: Family Medicine

## 2012-09-28 ENCOUNTER — Telehealth: Payer: Self-pay | Admitting: Family Medicine

## 2012-09-28 MED ORDER — ESZOPICLONE 2 MG PO TABS: 2.0000 mg | ORAL_TABLET | Freq: Every evening | ORAL | Status: DC | PRN

## 2012-09-28 NOTE — Telephone Encounter (Signed)
Pt called about reason she cannot get her Lunestsa. She thought it needed prior auth. It does not at this time, so I called [facility] in [place]. They need a new rx on it, before they can fill it. Sent an e-scribe to [place] on 9/29 and are wondering when we will send new rx to them. Does this have to wait for [facility], or can you ask another doc? Pt's last OV was 06/03/2012. Please let pt know if it cannot be filled this week. Thanks.

## 2012-09-28 NOTE — Telephone Encounter (Signed)
Medication called to the pharmacy and left on voicemail. 

## 2012-10-21 ENCOUNTER — Encounter: Payer: BC Managed Care – PPO | Admitting: Gastroenterology

## 2012-11-02 ENCOUNTER — Other Ambulatory Visit: Payer: Self-pay | Admitting: Family Medicine

## 2012-11-02 ENCOUNTER — Telehealth: Payer: Self-pay | Admitting: Family Medicine

## 2012-11-02 NOTE — Telephone Encounter (Signed)
Pt has been on Lunesta for a couple of years. States that [facility] usually refills her Lunesta with 2 - 3 refills. Last time it was filled by Dr. Clent Ridges & he did not give nay refills. She needs another rx now, and would like to get the 30 day rx with 3 refills. She has enough left for 2 nights, but would like it refilled before weekend if possible. Please send to [facility]. Last seen in office in June. If any issue, please call at the number provided.

## 2012-11-02 NOTE — Telephone Encounter (Signed)
Ok to refill 30 x  With 2 more refills.  Due for OV  Before runs out.

## 2012-11-03 ENCOUNTER — Other Ambulatory Visit: Payer: Self-pay | Admitting: Family Medicine

## 2012-11-03 MED ORDER — ESZOPICLONE 2 MG PO TABS: 2.0000 mg | ORAL_TABLET | Freq: Every evening | ORAL | Status: DC | PRN

## 2012-11-03 NOTE — Telephone Encounter (Signed)
Called and left on voicemail. 

## 2013-02-15 ENCOUNTER — Other Ambulatory Visit: Payer: Self-pay | Admitting: Obstetrics and Gynecology

## 2013-02-15 DIAGNOSIS — Z1231 Encounter for screening mammogram for malignant neoplasm of breast: Secondary | ICD-10-CM

## 2013-03-09 ENCOUNTER — Telehealth: Payer: Self-pay | Admitting: Family Medicine

## 2013-03-09 NOTE — Telephone Encounter (Signed)
The patient is requesting refills.  Last seen: 06/03/12 Last filled: 11/03/12 #30 and 2 additional refills Has a CPE scheduled for 04/05/13.  Please advise.  Thanks!!

## 2013-03-09 NOTE — Telephone Encounter (Signed)
Ok to refill x 3 ( 90 days )

## 2013-03-10 ENCOUNTER — Other Ambulatory Visit: Payer: Self-pay | Admitting: Family Medicine

## 2013-03-10 MED ORDER — ESZOPICLONE 2 MG PO TABS: 2.0000 mg | ORAL_TABLET | Freq: Every evening | ORAL | Status: DC | PRN

## 2013-03-10 NOTE — Telephone Encounter (Signed)
Called to the pharmacy and left on voicemail. 

## 2013-03-23 ENCOUNTER — Telehealth: Payer: Self-pay | Admitting: Internal Medicine

## 2013-03-23 NOTE — Telephone Encounter (Signed)
I usually refer to sleep specialist to decide on what testing is advised.    Rip Harbour to refer to our group or Dr Richardean Chimera  But need more info about reason for referral .

## 2013-03-23 NOTE — Telephone Encounter (Signed)
Patient called stating that she would like a referral for a sleep study. Please assist.

## 2013-03-24 ENCOUNTER — Other Ambulatory Visit: Payer: Self-pay | Admitting: Family Medicine

## 2013-03-24 DIAGNOSIS — G479 Sleep disorder, unspecified: Secondary | ICD-10-CM

## 2013-03-24 NOTE — Telephone Encounter (Signed)
Patient notified.  She agreed with referral to pulmonary.  Order placed in the system.

## 2013-03-24 NOTE — Telephone Encounter (Signed)
Left message on home and cell for the patient to return my call. 

## 2013-03-29 ENCOUNTER — Other Ambulatory Visit (INDEPENDENT_AMBULATORY_CARE_PROVIDER_SITE_OTHER): Payer: BC Managed Care – PPO

## 2013-03-29 DIAGNOSIS — Z0000 Encounter for general adult medical examination without abnormal findings: Secondary | ICD-10-CM

## 2013-03-29 LAB — CBC WITH DIFFERENTIAL/PLATELET
Eosinophils Absolute: 0.1 10*3/uL (ref 0.0–0.7)
MCHC: 34.3 g/dL (ref 30.0–36.0)
MCV: 93.3 fl (ref 78.0–100.0)
Monocytes Absolute: 0.5 10*3/uL (ref 0.1–1.0)
Neutrophils Relative %: 56.8 % (ref 43.0–77.0)
Platelets: 215 10*3/uL (ref 150.0–400.0)

## 2013-03-29 LAB — LIPID PANEL
Cholesterol: 174 mg/dL (ref 0–200)
LDL Cholesterol: 99 mg/dL (ref 0–99)
Triglycerides: 43 mg/dL (ref 0.0–149.0)
VLDL: 8.6 mg/dL (ref 0.0–40.0)

## 2013-03-29 LAB — HEPATIC FUNCTION PANEL
Bilirubin, Direct: 0.1 mg/dL (ref 0.0–0.3)
Total Bilirubin: 1 mg/dL (ref 0.3–1.2)

## 2013-03-29 LAB — BASIC METABOLIC PANEL
BUN: 24 mg/dL — ABNORMAL HIGH (ref 6–23)
CO2: 30 mEq/L (ref 19–32)
Chloride: 104 mEq/L (ref 96–112)
Creatinine, Ser: 0.7 mg/dL (ref 0.4–1.2)

## 2013-04-05 ENCOUNTER — Encounter: Payer: Self-pay | Admitting: Internal Medicine

## 2013-04-05 ENCOUNTER — Ambulatory Visit (INDEPENDENT_AMBULATORY_CARE_PROVIDER_SITE_OTHER): Payer: BC Managed Care – PPO | Admitting: Internal Medicine

## 2013-04-05 VITALS — BP 130/70 | HR 68 | Temp 98.1°F | Ht 64.5 in | Wt 118.0 lb

## 2013-04-05 DIAGNOSIS — G479 Sleep disorder, unspecified: Secondary | ICD-10-CM

## 2013-04-05 DIAGNOSIS — Z0000 Encounter for general adult medical examination without abnormal findings: Secondary | ICD-10-CM

## 2013-04-05 DIAGNOSIS — K089 Disorder of teeth and supporting structures, unspecified: Secondary | ICD-10-CM

## 2013-04-05 NOTE — Progress Notes (Signed)
Chief Complaint  Patient presents with  . Annual Exam    HPI: Patient comes in today for Preventive Health Care visit  No major change in health status since last visit . 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   She is exhausted and feels like  52 years old . Back on the lunesta 2 mg helps ome  Can sleep 5-7 but has to get up for work and kids.   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  Getting another sleep consult  To see if other info can help her get some rest.  3 mg of luensta gave bad taste in past.    Dec short term  Memory now.   ROS:  GEN/ HEENT: No fever, significant weight changes sweats headaches vision problems hearing changes, CV/ PULM; No chest pain shortness of breath cough, syncope,edema  change in exercise tolerance. GI /GU: No adominal pain, vomiting, change in bowel habits. No blood in the stool. No significant GU symptoms. SKIN/HEME: ,no acute skin rashes suspicious lesions or bleeding. No lymphadenopathy, nodules, masses.  NEURO/ PSYCH:  No neurologic signs such as weakness numbness. No depression anxiety. IMM/ Allergy: No unusual infections.  Allergy .   Some nose soreness at times  Saline  REST of 12 system review negative except as per HPI   Past Medical History  Diagnosis Date  . Temporomandibular joint disorder (TMJ)   . Trouble in sleeping     in young teenage years  . Normal nuclear stress test     nl stress echo [name] 2002  . Osteopenia   . Arthritis     Family History  Problem Relation Age of Onset  . Coronary artery disease Mother   . Hypertension Mother   . Osteoporosis Mother   . COPD Mother   . Hypertension Father   . Heart attack Father   . Heart disease Father 47  . Glaucoma Son     congenital glaucomea has shunt in good eye one enucleated  . Vitiligo Son     younger  . Breast cancer      cousins  . Lung cancer      aunts, uncles,  cousin  . Cancer Maternal Aunt     Lung    History   Social History  . Marital Status: Married    Spouse Name: N/A    Number of Children: 3  . Years of Education: N/A   Occupational History  . dental hygenist    Social History Main Topics  . Smoking status: Never Smoker   . Smokeless tobacco: Never Used     Comment: Passive smoke exposure   . Alcohol Use: Yes     Comment: socially  . Drug Use: No  . Sexually Active: None   Other Topics Concern  . 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 eszopiclone (LUNESTA) 2 MG TABS Take  1 tablet (2 mg total) by mouth at bedtime as needed. Take immediately before bedtime  30 tablet  2  . Multiple Vitamins-Minerals (MULTIVITAMIN,TX-MINERALS) tablet Take 1 tablet by mouth daily.        . [DISCONTINUED] calcium carbonate (TUMS LASTING EFFECTS) 500 MG chewable tablet Chew 1 tablet by mouth daily as needed.        No facility-administered encounter medications on file as of 04/05/2013.    EXAM:  BP 130/70  Pulse 68  Temp(Src) 98.1 F (36.7 C) (Oral)  Ht 5' 4.5" (1.638 m)  Wt 118 lb (53.524 kg)  BMI 19.95 kg/m2  SpO2 98%  Body mass index is 19.95 kg/(m^2).  Physical Exam: Vital signs reviewed [id number] is a well-developed well-nourished alert cooperative   female who appears her stated age in no acute distress.  Looks a bit tired but nl cognition and speech.  HEENT: normocephalic atraumatic , Eyes: PERRL EOM's full, conjunctiva clear, Nares: paten,t no deformity discharge or tenderness.,slight crustin end of nose   No lesion seen Ears: no deformity EAC's clear TMs with normal landmarks. Mouth: clear OP, no lesions, edema.  Moist mucous  membranes. Dentition in adequate repair.  Braces on  NECK: supple without masses, thyromegaly or bruits. CHEST/PULM:  Clear to auscultation and percussion breath sounds equal no wheeze , rales or rhonchi. No chest wall deformities or tenderness. Breast: normal by inspection . No dimpling, discharge, masses, tenderness or discharge . implants nl  CV: PMI is nondisplaced, S1 S2 no gallops, murmurs, rubs. Peripheral pulses are full without delay.No JVD .  ABDOMEN: Bowel sounds normal nontender  No guard or rebound, no hepato splenomegal no CVA tenderness.  No hernia. Extremtities:  No clubbing cyanosis or edema, no acute joint swelling or redness no focal atrophy NEURO:  Oriented x3, cranial nerves 3-12 appear to be intact, no obvious focal weakness,gait within normal limits no abnormal reflexes or asymmetrical SKIN: No acute rashes normal turgor, color, no bruising or petechiae. PSYCH: Oriented, good eye contact, no obvious depression anxiety, cognition and judgment appear normal. LN: no cervical axillary inguinal adenopathy  Lab Results  Component Value Date   WBC 4.7 03/29/2013   HGB 13.3 03/29/2013   HCT 38.8 03/29/2013   PLT 215.0 03/29/2013   GLUCOSE 85 03/29/2013   CHOL 174 03/29/2013   TRIG 43.0 03/29/2013   HDL 66.80 03/29/2013   LDLCALC 99 03/29/2013   ALT 21 03/29/2013   AST 27 03/29/2013   NA 139 03/29/2013   K 3.8 03/29/2013   CL 104 03/29/2013   CREATININE 0.7 03/29/2013   BUN 24* 03/29/2013   CO2 30 03/29/2013   TSH 2.00 03/29/2013    ASSESSMENT AND PLAN:  Discussed the following assessment and plan:  Visit for preventive health examination  UNSPECIFIED SLEEP DISTURBANCE  DISORDER, DENTAL NOS - braces for therapy She will be getting pap dr Senaida Ores and have to resch the colonoscopy.   Repeat bp nl can recheck out of office   Disc about sleep and  Oral problems  Trying  To help .  Ok to use lunesta 2 mg contact us if want to try 3 mg again.  Using mostly 3 nights per week or as needed.  I  think her memory fatigue is from sleep deprivation. To continue sleep hygiene as best possible and keep appt for sleep evaluation Patient Care Team: Madelin Headings, MD as PCP - General Storm Frisk, MD (Pulmonary Disease) Oliver Pila, MD (Obstetrics and Gynecology)  Patient Instructions  Contact us for  refill of  lunesta   If want to increase trial to 3 mg .  Can try saline  To nose for dryness  Sleep deprivation can cause memory  Problems and fatigue.  Bp was up some today  130/70  Recheck when resting   A couple time to see if normal.    Preventive Care for Adults, Female A healthy lifestyle and preventive care can promote health and wellness. Preventive health guidelines for women include the following key practices.  A routine yearly physical is a good way to check with your caregiver about your health and preventive screening. It is a chance to share any concerns and updates on your health, and to receive a thorough exam.  Visit your dentist for a routine exam and preventive care every 6 months. Brush your teeth twice a day and floss once a day. Good oral hygiene prevents tooth decay and gum disease.  The frequency of eye exams is based on your age, health, family medical history, use of contact lenses, and other factors. Follow your caregiver's recommendations for frequency of eye exams.  Eat a healthy diet. Foods like vegetables, fruits, whole grains, low-fat dairy products, and lean protein foods contain the nutrients you need without too many calories. Decrease your intake of foods high in solid fats, added sugars, and salt. Eat the right amount of calories for you.Get information about a proper diet from your caregiver, if necessary.  Regular physical exercise is one of the most important things you can do for your health. Most adults should get at least 150 minutes of moderate-intensity exercise (any activity that increases your heart rate and causes you to sweat)  each week. In addition, most adults need muscle-strengthening exercises on 2 or more days a week.  Maintain a healthy weight. The body mass index (BMI) is a screening tool to identify possible weight problems. It provides an estimate of body fat based on height and weight. Your caregiver can help determine your BMI, and can help you achieve or maintain a healthy weight.For adults 20 years and older:  A BMI below 18.5 is considered underweight.  A BMI of 18.5 to 24.9 is normal.  A BMI of 25 to 29.9 is considered overweight.  A BMI of 30 and above is considered obese.  Maintain normal blood lipids and cholesterol levels by exercising and minimizing your intake of saturated fat. Eat a balanced diet with plenty of fruit and vegetables. Blood tests for lipids and cholesterol should begin at age 64 and be repeated every 5 years. If your lipid or cholesterol levels are high, you are over 50, or you are at high risk for heart disease, you may need your cholesterol levels checked more frequently.Ongoing high lipid and cholesterol levels should be treated with medicines if diet and exercise are not effective.  If you smoke, find out from your caregiver how to quit. If you do not use tobacco, do not start.  If you are pregnant, do not drink alcohol. If you are breastfeeding, be very cautious about drinking alcohol. If you are not pregnant and choose to drink alcohol, do not exceed 1 drink per day. One drink is considered to be 12 ounces (355 mL) of beer, 5 ounces (148 mL) of wine, or 1.5 ounces (44 mL) of liquor.  Avoid use of street drugs. Do not share needles with anyone. Ask for help if you need support or instructions about stopping the use of drugs.  High blood pressure causes heart disease and increases the risk of stroke. Your blood pressure should be checked at least every 1 to 2 years. Ongoing high blood pressure should be treated with medicines if weight loss and exercise are not  effective.  If you are 56 to 52 years old, ask your caregiver if you should take aspirin to prevent strokes.  Diabetes screening involves taking a blood sample to check your fasting blood sugar level. This should be done once every 3 years, after age 21, if you are within normal weight and without risk factors for diabetes. Testing should be considered at a younger age or be carried out more frequently if you are overweight and have at least 1 risk factor for diabetes.  Breast cancer screening is essential preventive care for women. You should practice "breast self-awareness." This means understanding the normal appearance and feel of your breasts and may include breast self-examination. Any changes detected, no matter how small, should be reported to a caregiver. Women in their 34s and 30s should have a clinical breast exam (CBE) by a caregiver as part of a regular health exam every 1 to 3 years. After age 23, women should have a CBE every year. Starting at age 23, women should consider having a mammography (breast X-ray test) every year. Women who have a family history of breast cancer should talk to their caregiver about genetic screening. Women at a high risk of breast cancer should talk to their caregivers about having magnetic resonance imaging (MRI) and a mammography every year.  The Pap test is a screening test for cervical cancer. A Pap test can show cell changes on the cervix that might become cervical cancer if left untreated. A Pap test is a procedure in which cells are obtained and examined from the lower end of the uterus (cervix).  Women should have a Pap test starting at age 39.  Between ages 61 and 59, Pap tests should be repeated every 2 years.  Beginning at age 52, you should have a Pap test every 3 years as long as the past 3 Pap tests have been normal.  Some women have medical problems that increase the chance of getting cervical cancer. Talk to your caregiver about these  problems. It is especially important to talk to your caregiver if a new problem develops soon after your last Pap test. In these cases, your caregiver may recommend more frequent screening and Pap tests.  The above recommendations are the same for women who have or have not gotten the vaccine for human papillomavirus (HPV).  If you had a hysterectomy for a problem that was not cancer or a condition that could lead to cancer, then you no longer need Pap tests. Even if you no longer need a Pap test, a regular exam is a good idea to make sure no other problems are starting.  If you are between ages 59 and 76, and you have had normal Pap tests going back 10 years, you no longer need Pap tests. Even if you no longer need a Pap test, a regular exam is a good idea to make sure no other problems are starting.  If you have had past treatment for cervical cancer or a condition that could lead to cancer, you need Pap tests and screening for cancer for at least 20 years after your treatment.  If Pap tests have been discontinued, risk factors (such as a new sexual partner) need to be reassessed to determine if  screening should be resumed.  The HPV test is an additional test that may be used for cervical cancer screening. The HPV test looks for the virus that can cause the cell changes on the cervix. The cells collected during the Pap test can be tested for HPV. The HPV test could be used to screen women aged 46 years and older, and should be used in women of any age who have unclear Pap test results. After the age of 86, women should have HPV testing at the same frequency as a Pap test.  Colorectal cancer can be detected and often prevented. Most routine colorectal cancer screening begins at the age of 58 and continues through age 63. However, your caregiver may recommend screening at an earlier age if you have risk factors for colon cancer. On a yearly basis, your caregiver may provide home test kits to check for  hidden blood in the stool. Use of a small camera at the end of a tube, to directly examine the colon (sigmoidoscopy or colonoscopy), can detect the earliest forms of colorectal cancer. Talk to your caregiver about this at age 29, when routine screening begins. Direct examination of the colon should be repeated every 5 to 10 years through age 72, unless early forms of pre-cancerous polyps or small growths are found.  Hepatitis C blood testing is recommended for all people born from [birth year] through 1965 and any individual with known risks for hepatitis C.  Practice safe sex. Use condoms and avoid high-risk sexual practices to reduce the spread of sexually transmitted infections (STIs). STIs include gonorrhea, chlamydia, syphilis, trichomonas, herpes, HPV, and human immunodeficiency virus (HIV). Herpes, HIV, and HPV are viral illnesses that have no cure. They can result in disability, cancer, and death. Sexually active women aged 48 and younger should be checked for chlamydia. Older women with new or multiple partners should also be tested for chlamydia. Testing for other STIs is recommended if you are sexually active and at increased risk.  Osteoporosis is a disease in which the bones lose minerals and strength with aging. This can result in serious bone fractures. The risk of osteoporosis can be identified using a bone density scan. Women ages 21 and over and women at risk for fractures or osteoporosis should discuss screening with their caregivers. Ask your caregiver whether you should take a calcium supplement or vitamin D to reduce the rate of osteoporosis.  Menopause can be associated with physical symptoms and risks. Hormone replacement therapy is available to decrease symptoms and risks. You should talk to your caregiver about whether hormone replacement therapy is right for you.  Use sunscreen with sun protection factor (SPF) of 30 or more. Apply sunscreen liberally and repeatedly throughout the day.  You should seek shade when your shadow is shorter than you. Protect yourself by wearing long sleeves, pants, a wide-brimmed hat, and sunglasses year round, whenever you are outdoors.  Once a month, do a whole body skin exam, using a mirror to look at the skin on your back. Notify your caregiver of new moles, moles that have irregular borders, moles that are larger than a pencil eraser, or moles that have changed in shape or color.  Stay current with required immunizations.  Influenza. You need a dose every fall (or winter). The composition of the flu vaccine changes each year, so being vaccinated once is not enough.  Pneumococcal polysaccharide. You need 1 to 2 doses if you smoke cigarettes or if you have certain chronic medical conditions.  You need 1 dose at age 88 (or older) if you have never been vaccinated.  Tetanus, diphtheria, pertussis (Tdap, Td). Get 1 dose of Tdap vaccine if you are younger than age 83, are over 56 and have contact with an infant, are a Research scientist (physical sciences), are pregnant, or simply want to be protected from whooping cough. After that, you need a Td booster dose every 10 years. Consult your caregiver if you have not had at least 3 tetanus and diphtheria-containing shots sometime in your life or have a deep or dirty wound.  HPV. You need this vaccine if you are a woman age 8 or younger. The vaccine is given in 3 doses over 6 months.  Measles, mumps, rubella (MMR). You need at least 1 dose of MMR if you were born in 1957 or later. You may also need a second dose.  Meningococcal. If you are age 78 to 32 and a first-year college student living in a residence hall, or have one of several medical conditions, you need to get vaccinated against meningococcal disease. You may also need additional booster doses.  Zoster (shingles). If you are age 19 or older, you should get this vaccine.  Varicella (chickenpox). If you have never had chickenpox or you were vaccinated but received  only 1 dose, talk to your caregiver to find out if you need this vaccine.  Hepatitis A. You need this vaccine if you have a specific risk factor for hepatitis A virus infection or you simply wish to be protected from this disease. The vaccine is usually given as 2 doses, 6 to 18 months apart.  Hepatitis B. You need this vaccine if you have a specific risk factor for hepatitis B virus infection or you simply wish to be protected from this disease. The vaccine is given in 3 doses, usually over 6 months. Preventive Services / Frequency Ages 88 to 39  Blood pressure check.** / Every 1 to 2 years.  Lipid and cholesterol check.** / Every 5 years beginning at age 27.  Clinical breast exam.** / Every 3 years for women in their 59s and 30s.  Pap test.** / Every 2 years from ages 4 through 82. Every 3 years starting at age 22 through age 15 or 32 with a history of 3 consecutive normal Pap tests.  HPV screening.** / Every 3 years from ages 56 through ages 6 to 36 with a history of 3 consecutive normal Pap tests.  Hepatitis C blood test.** / For any individual with known risks for hepatitis C.  Skin self-exam. / Monthly.  Influenza immunization.** / Every year.  Pneumococcal polysaccharide immunization.** / 1 to 2 doses if you smoke cigarettes or if you have certain chronic medical conditions.  Tetanus, diphtheria, pertussis (Tdap, Td) immunization. / A one-time dose of Tdap vaccine. After that, you need a Td booster dose every 10 years.  HPV immunization. / 3 doses over 6 months, if you are 73 and younger.  Measles, mumps, rubella (MMR) immunization. / You need at least 1 dose of MMR if you were born in 1957 or later. You may also need a second dose.  Meningococcal immunization. / 1 dose if you are age 71 to 36 and a first-year college student living in a residence hall, or have one of several medical conditions, you need to get vaccinated against meningococcal disease. You may also need  additional booster doses.  Varicella immunization.** / Consult your caregiver.  Hepatitis A immunization.** / Consult your caregiver. 2 doses,  6 to 18 months apart.  Hepatitis B immunization.** / Consult your caregiver. 3 doses usually over 6 months. Ages 40 to 43  Blood pressure check.** / Every 1 to 2 years.  Lipid and cholesterol check.** / Every 5 years beginning at age 49.  Clinical breast exam.** / Every year after age 87.  Mammogram.** / Every year beginning at age 63 and continuing for as long as you are in good health. Consult with your caregiver.  Pap test.** / Every 3 years starting at age 59 through age 65 or 84 with a history of 3 consecutive normal Pap tests.  HPV screening.** / Every 3 years from ages 33 through ages 50 to 77 with a history of 3 consecutive normal Pap tests.  Fecal occult blood test (FOBT) of stool. / Every year beginning at age 55 and continuing until age 81. You may not need to do this test if you get a colonoscopy every 10 years.  Flexible sigmoidoscopy or colonoscopy.** / Every 5 years for a flexible sigmoidoscopy or every 10 years for a colonoscopy beginning at age 58 and continuing until age 68.  Hepatitis C blood test.** / For all people born from [birth year] through 1965 and any individual with known risks for hepatitis C.  Skin self-exam. / Monthly.  Influenza immunization.** / Every year.  Pneumococcal polysaccharide immunization.** / 1 to 2 doses if you smoke cigarettes or if you have certain chronic medical conditions.  Tetanus, diphtheria, pertussis (Tdap, Td) immunization.** / A one-time dose of Tdap vaccine. After that, you need a Td booster dose every 10 years.  Measles, mumps, rubella (MMR) immunization. / You need at least 1 dose of MMR if you were born in 1957 or later. You may also need a second dose.  Varicella immunization.** / Consult your caregiver.  Meningococcal immunization.** / Consult your caregiver.  Hepatitis A  immunization.** / Consult your caregiver. 2 doses, 6 to 18 months apart.  Hepatitis B immunization.** / Consult your caregiver. 3 doses, usually over 6 months. Ages 23 and over  Blood pressure check.** / Every 1 to 2 years.  Lipid and cholesterol check.** / Every 5 years beginning at age 52.  Clinical breast exam.** / Every year after age 55.  Mammogram.** / Every year beginning at age 19 and continuing for as long as you are in good health. Consult with your caregiver.  Pap test.** / Every 3 years starting at age 9 through age 57 or 71 with a 3 consecutive normal Pap tests. Testing can be stopped between 65 and 70 with 3 consecutive normal Pap tests and no abnormal Pap or HPV tests in the past 10 years.  HPV screening.** / Every 3 years from ages 21 through ages 74 or 59 with a history of 3 consecutive normal Pap tests. Testing can be stopped between 65 and 70 with 3 consecutive normal Pap tests and no abnormal Pap or HPV tests in the past 10 years.  Fecal occult blood test (FOBT) of stool. / Every year beginning at age 61 and continuing until age 43. You may not need to do this test if you get a colonoscopy every 10 years.  Flexible sigmoidoscopy or colonoscopy.** / Every 5 years for a flexible sigmoidoscopy or every 10 years for a colonoscopy beginning at age 44 and continuing until age 19.  Hepatitis C blood test.** / For all people born from [birth year] through 1965 and any individual with known risks for hepatitis C.  Osteoporosis screening.** /  A one-time screening for women ages 82 and over and women at risk for fractures or osteoporosis.  Skin self-exam. / Monthly.  Influenza immunization.** / Every year.  Pneumococcal polysaccharide immunization.** / 1 dose at age 72 (or older) if you have never been vaccinated.  Tetanus, diphtheria, pertussis (Tdap, Td) immunization. / A one-time dose of Tdap vaccine if you are over 65 and have contact with an infant, are a Research scientist (physical sciences), or  simply want to be protected from whooping cough. After that, you need a Td booster dose every 10 years.  Varicella immunization.** / Consult your caregiver.  Meningococcal immunization.** / Consult your caregiver.  Hepatitis A immunization.** / Consult your caregiver. 2 doses, 6 to 18 months apart.  Hepatitis B immunization.** / Check with your caregiver. 3 doses, usually over 6 months. ** Family history and personal history of risk and conditions may change your caregiver's recommendations. Document Released: 02/09/2002 Document Revised: 03/07/2012 Document Reviewed: 05/11/2011 [facility] Patient Information 2013 [place], [place].     Neta Mends. Syana Degraffenreid M.D.  Health Maintenance  Topic Date Due  . Mammogram  08/01/2011  . Influenza Vaccine  08/28/2013  . Pap Smear  09/22/2014  . Tetanus/tdap  12/03/2016  . Colonoscopy  11/22/2021   Health Maintenance Review

## 2013-04-05 NOTE — Patient Instructions (Signed)
Contact us for  refill of  lunesta   If want to increase trial to 3 mg .  Can try saline  To nose for dryness  Sleep deprivation can cause memory  Problems and fatigue.  Bp was up some today  130/70  Recheck when resting   A couple time to see if normal.    Preventive Care for Adults, Female A healthy lifestyle and preventive care can promote health and wellness. Preventive health guidelines for women include the following key practices.  A routine yearly physical is a good way to check with your caregiver about your health and preventive screening. It is a chance to share any concerns and updates on your health, and to receive a thorough exam.  Visit your dentist for a routine exam and preventive care every 6 months. Brush your teeth twice a day and floss once a day. Good oral hygiene prevents tooth decay and gum disease.  The frequency of eye exams is based on your age, health, family medical history, use of contact lenses, and other factors. Follow your caregiver's recommendations for frequency of eye exams.  Eat a healthy diet. Foods like vegetables, fruits, whole grains, low-fat dairy products, and lean protein foods contain the nutrients you need without too many calories. Decrease your intake of foods high in solid fats, added sugars, and salt. Eat the right amount of calories for you.Get information about a proper diet from your caregiver, if necessary.  Regular physical exercise is one of the most important things you can do for your health. Most adults should get at least 150 minutes of moderate-intensity exercise (any activity that increases your heart rate and causes you to sweat) each week. In addition, most adults need muscle-strengthening exercises on 2 or more days a week.  Maintain a healthy weight. The body mass index (BMI) is a screening tool to identify possible weight problems. It provides an estimate of body fat based on height and weight. Your caregiver can help  determine your BMI, and can help you achieve or maintain a healthy weight.For adults 20 years and older:  A BMI below 18.5 is considered underweight.  A BMI of 18.5 to 24.9 is normal.  A BMI of 25 to 29.9 is considered overweight.  A BMI of 30 and above is considered obese.  Maintain normal blood lipids and cholesterol levels by exercising and minimizing your intake of saturated fat. Eat a balanced diet with plenty of fruit and vegetables. Blood tests for lipids and cholesterol should begin at age 58 and be repeated every 5 years. If your lipid or cholesterol levels are high, you are over 50, or you are at high risk for heart disease, you may need your cholesterol levels checked more frequently.Ongoing high lipid and cholesterol levels should be treated with medicines if diet and exercise are not effective.  If you smoke, find out from your caregiver how to quit. If you do not use tobacco, do not start.  If you are pregnant, do not drink alcohol. If you are breastfeeding, be very cautious about drinking alcohol. If you are not pregnant and choose to drink alcohol, do not exceed 1 drink per day. One drink is considered to be 12 ounces (355 mL) of beer, 5 ounces (148 mL) of wine, or 1.5 ounces (44 mL) of liquor.  Avoid use of street drugs. Do not share needles with anyone. Ask for help if you need support or instructions about stopping the use of drugs.  High blood  pressure causes heart disease and increases the risk of stroke. Your blood pressure should be checked at least every 1 to 2 years. Ongoing high blood pressure should be treated with medicines if weight loss and exercise are not effective.  If you are 8 to 52 years old, ask your caregiver if you should take aspirin to prevent strokes.  Diabetes screening involves taking a blood sample to check your fasting blood sugar level. This should be done once every 3 years, after age 31, if you are within normal weight and without risk factors  for diabetes. Testing should be considered at a younger age or be carried out more frequently if you are overweight and have at least 1 risk factor for diabetes.  Breast cancer screening is essential preventive care for women. You should practice "breast self-awareness." This means understanding the normal appearance and feel of your breasts and may include breast self-examination. Any changes detected, no matter how small, should be reported to a caregiver. Women in their 81s and 30s should have a clinical breast exam (CBE) by a caregiver as part of a regular health exam every 1 to 3 years. After age 41, women should have a CBE every year. Starting at age 41, women should consider having a mammography (breast X-ray test) every year. Women who have a family history of breast cancer should talk to their caregiver about genetic screening. Women at a high risk of breast cancer should talk to their caregivers about having magnetic resonance imaging (MRI) and a mammography every year.  The Pap test is a screening test for cervical cancer. A Pap test can show cell changes on the cervix that might become cervical cancer if left untreated. A Pap test is a procedure in which cells are obtained and examined from the lower end of the uterus (cervix).  Women should have a Pap test starting at age 36.  Between ages 66 and 70, Pap tests should be repeated every 2 years.  Beginning at age 44, you should have a Pap test every 3 years as long as the past 3 Pap tests have been normal.  Some women have medical problems that increase the chance of getting cervical cancer. Talk to your caregiver about these problems. It is especially important to talk to your caregiver if a new problem develops soon after your last Pap test. In these cases, your caregiver may recommend more frequent screening and Pap tests.  The above recommendations are the same for women who have or have not gotten the vaccine for human papillomavirus  (HPV).  If you had a hysterectomy for a problem that was not cancer or a condition that could lead to cancer, then you no longer need Pap tests. Even if you no longer need a Pap test, a regular exam is a good idea to make sure no other problems are starting.  If you are between ages 21 and 45, and you have had normal Pap tests going back 10 years, you no longer need Pap tests. Even if you no longer need a Pap test, a regular exam is a good idea to make sure no other problems are starting.  If you have had past treatment for cervical cancer or a condition that could lead to cancer, you need Pap tests and screening for cancer for at least 20 years after your treatment.  If Pap tests have been discontinued, risk factors (such as a new sexual partner) need to be reassessed to determine if screening should  be resumed.  The HPV test is an additional test that may be used for cervical cancer screening. The HPV test looks for the virus that can cause the cell changes on the cervix. The cells collected during the Pap test can be tested for HPV. The HPV test could be used to screen women aged 48 years and older, and should be used in women of any age who have unclear Pap test results. After the age of 8, women should have HPV testing at the same frequency as a Pap test.  Colorectal cancer can be detected and often prevented. Most routine colorectal cancer screening begins at the age of 30 and continues through age 81. However, your caregiver may recommend screening at an earlier age if you have risk factors for colon cancer. On a yearly basis, your caregiver may provide home test kits to check for hidden blood in the stool. Use of a small camera at the end of a tube, to directly examine the colon (sigmoidoscopy or colonoscopy), can detect the earliest forms of colorectal cancer. Talk to your caregiver about this at age 69, when routine screening begins. Direct examination of the colon should be repeated every 5  to 10 years through age 73, unless early forms of pre-cancerous polyps or small growths are found.  Hepatitis C blood testing is recommended for all people born from [birth year] through 1965 and any individual with known risks for hepatitis C.  Practice safe sex. Use condoms and avoid high-risk sexual practices to reduce the spread of sexually transmitted infections (STIs). STIs include gonorrhea, chlamydia, syphilis, trichomonas, herpes, HPV, and human immunodeficiency virus (HIV). Herpes, HIV, and HPV are viral illnesses that have no cure. They can result in disability, cancer, and death. Sexually active women aged 22 and younger should be checked for chlamydia. Older women with new or multiple partners should also be tested for chlamydia. Testing for other STIs is recommended if you are sexually active and at increased risk.  Osteoporosis is a disease in which the bones lose minerals and strength with aging. This can result in serious bone fractures. The risk of osteoporosis can be identified using a bone density scan. Women ages 18 and over and women at risk for fractures or osteoporosis should discuss screening with their caregivers. Ask your caregiver whether you should take a calcium supplement or vitamin D to reduce the rate of osteoporosis.  Menopause can be associated with physical symptoms and risks. Hormone replacement therapy is available to decrease symptoms and risks. You should talk to your caregiver about whether hormone replacement therapy is right for you.  Use sunscreen with sun protection factor (SPF) of 30 or more. Apply sunscreen liberally and repeatedly throughout the day. You should seek shade when your shadow is shorter than you. Protect yourself by wearing long sleeves, pants, a wide-brimmed hat, and sunglasses year round, whenever you are outdoors.  Once a month, do a whole body skin exam, using a mirror to look at the skin on your back. Notify your caregiver of new moles, moles that  have irregular borders, moles that are larger than a pencil eraser, or moles that have changed in shape or color.  Stay current with required immunizations.  Influenza. You need a dose every fall (or winter). The composition of the flu vaccine changes each year, so being vaccinated once is not enough.  Pneumococcal polysaccharide. You need 1 to 2 doses if you smoke cigarettes or if you have certain chronic medical conditions. You need  1 dose at age 71 (or older) if you have never been vaccinated.  Tetanus, diphtheria, pertussis (Tdap, Td). Get 1 dose of Tdap vaccine if you are younger than age 78, are over 37 and have contact with an infant, are a Research scientist (physical sciences), are pregnant, or simply want to be protected from whooping cough. After that, you need a Td booster dose every 10 years. Consult your caregiver if you have not had at least 3 tetanus and diphtheria-containing shots sometime in your life or have a deep or dirty wound.  HPV. You need this vaccine if you are a woman age 32 or younger. The vaccine is given in 3 doses over 6 months.  Measles, mumps, rubella (MMR). You need at least 1 dose of MMR if you were born in 1957 or later. You may also need a second dose.  Meningococcal. If you are age 35 to 90 and a first-year college student living in a residence hall, or have one of several medical conditions, you need to get vaccinated against meningococcal disease. You may also need additional booster doses.  Zoster (shingles). If you are age 27 or older, you should get this vaccine.  Varicella (chickenpox). If you have never had chickenpox or you were vaccinated but received only 1 dose, talk to your caregiver to find out if you need this vaccine.  Hepatitis A. You need this vaccine if you have a specific risk factor for hepatitis A virus infection or you simply wish to be protected from this disease. The vaccine is usually given as 2 doses, 6 to 18 months apart.  Hepatitis B. You need this  vaccine if you have a specific risk factor for hepatitis B virus infection or you simply wish to be protected from this disease. The vaccine is given in 3 doses, usually over 6 months. Preventive Services / Frequency Ages 12 to 54  Blood pressure check.** / Every 1 to 2 years.  Lipid and cholesterol check.** / Every 5 years beginning at age 1.  Clinical breast exam.** / Every 3 years for women in their 28s and 30s.  Pap test.** / Every 2 years from ages 53 through 109. Every 3 years starting at age 4 through age 56 or 63 with a history of 3 consecutive normal Pap tests.  HPV screening.** / Every 3 years from ages 19 through ages 20 to 77 with a history of 3 consecutive normal Pap tests.  Hepatitis C blood test.** / For any individual with known risks for hepatitis C.  Skin self-exam. / Monthly.  Influenza immunization.** / Every year.  Pneumococcal polysaccharide immunization.** / 1 to 2 doses if you smoke cigarettes or if you have certain chronic medical conditions.  Tetanus, diphtheria, pertussis (Tdap, Td) immunization. / A one-time dose of Tdap vaccine. After that, you need a Td booster dose every 10 years.  HPV immunization. / 3 doses over 6 months, if you are 7 and younger.  Measles, mumps, rubella (MMR) immunization. / You need at least 1 dose of MMR if you were born in 1957 or later. You may also need a second dose.  Meningococcal immunization. / 1 dose if you are age 48 to 44 and a first-year college student living in a residence hall, or have one of several medical conditions, you need to get vaccinated against meningococcal disease. You may also need additional booster doses.  Varicella immunization.** / Consult your caregiver.  Hepatitis A immunization.** / Consult your caregiver. 2 doses, 6 to 18  months apart.  Hepatitis B immunization.** / Consult your caregiver. 3 doses usually over 6 months. Ages 30 to 72  Blood pressure check.** / Every 1 to 2 years.  Lipid  and cholesterol check.** / Every 5 years beginning at age 77.  Clinical breast exam.** / Every year after age 82.  Mammogram.** / Every year beginning at age 43 and continuing for as long as you are in good health. Consult with your caregiver.  Pap test.** / Every 3 years starting at age 39 through age 36 or 18 with a history of 3 consecutive normal Pap tests.  HPV screening.** / Every 3 years from ages 12 through ages 40 to 37 with a history of 3 consecutive normal Pap tests.  Fecal occult blood test (FOBT) of stool. / Every year beginning at age 73 and continuing until age 66. You may not need to do this test if you get a colonoscopy every 10 years.  Flexible sigmoidoscopy or colonoscopy.** / Every 5 years for a flexible sigmoidoscopy or every 10 years for a colonoscopy beginning at age 84 and continuing until age 37.  Hepatitis C blood test.** / For all people born from [birth year] through 1965 and any individual with known risks for hepatitis C.  Skin self-exam. / Monthly.  Influenza immunization.** / Every year.  Pneumococcal polysaccharide immunization.** / 1 to 2 doses if you smoke cigarettes or if you have certain chronic medical conditions.  Tetanus, diphtheria, pertussis (Tdap, Td) immunization.** / A one-time dose of Tdap vaccine. After that, you need a Td booster dose every 10 years.  Measles, mumps, rubella (MMR) immunization. / You need at least 1 dose of MMR if you were born in 1957 or later. You may also need a second dose.  Varicella immunization.** / Consult your caregiver.  Meningococcal immunization.** / Consult your caregiver.  Hepatitis A immunization.** / Consult your caregiver. 2 doses, 6 to 18 months apart.  Hepatitis B immunization.** / Consult your caregiver. 3 doses, usually over 6 months. Ages 45 and over  Blood pressure check.** / Every 1 to 2 years.  Lipid and cholesterol check.** / Every 5 years beginning at age 11.  Clinical breast exam.** / Every year  after age 25.  Mammogram.** / Every year beginning at age 22 and continuing for as long as you are in good health. Consult with your caregiver.  Pap test.** / Every 3 years starting at age 51 through age 63 or 54 with a 3 consecutive normal Pap tests. Testing can be stopped between 65 and 70 with 3 consecutive normal Pap tests and no abnormal Pap or HPV tests in the past 10 years.  HPV screening.** / Every 3 years from ages 60 through ages 64 or 33 with a history of 3 consecutive normal Pap tests. Testing can be stopped between 65 and 70 with 3 consecutive normal Pap tests and no abnormal Pap or HPV tests in the past 10 years.  Fecal occult blood test (FOBT) of stool. / Every year beginning at age 71 and continuing until age 62. You may not need to do this test if you get a colonoscopy every 10 years.  Flexible sigmoidoscopy or colonoscopy.** / Every 5 years for a flexible sigmoidoscopy or every 10 years for a colonoscopy beginning at age 49 and continuing until age 68.  Hepatitis C blood test.** / For all people born from [birth year] through 1965 and any individual with known risks for hepatitis C.  Osteoporosis screening.** / A one-time  screening for women ages 61 and over and women at risk for fractures or osteoporosis.  Skin self-exam. / Monthly.  Influenza immunization.** / Every year.  Pneumococcal polysaccharide immunization.** / 1 dose at age 35 (or older) if you have never been vaccinated.  Tetanus, diphtheria, pertussis (Tdap, Td) immunization. / A one-time dose of Tdap vaccine if you are over 65 and have contact with an infant, are a Research scientist (physical sciences), or simply want to be protected from whooping cough. After that, you need a Td booster dose every 10 years.  Varicella immunization.** / Consult your caregiver.  Meningococcal immunization.** / Consult your caregiver.  Hepatitis A immunization.** / Consult your caregiver. 2 doses, 6 to 18 months apart.  Hepatitis B immunization.** /  Check with your caregiver. 3 doses, usually over 6 months. ** Family history and personal history of risk and conditions may change your caregiver's recommendations. Document Released: 02/09/2002 Document Revised: 03/07/2012 Document Reviewed: 05/11/2011 [facility] Patient Information 2013 [place], [place].

## 2013-04-20 ENCOUNTER — Institutional Professional Consult (permissible substitution): Payer: BC Managed Care – PPO | Admitting: Pulmonary Disease

## 2013-04-26 ENCOUNTER — Institutional Professional Consult (permissible substitution): Payer: BC Managed Care – PPO | Admitting: Pulmonary Disease

## 2013-04-26 ENCOUNTER — Ambulatory Visit (INDEPENDENT_AMBULATORY_CARE_PROVIDER_SITE_OTHER): Payer: BC Managed Care – PPO | Admitting: Pulmonary Disease

## 2013-04-26 ENCOUNTER — Encounter: Payer: Self-pay | Admitting: Pulmonary Disease

## 2013-04-26 VITALS — BP 110/74 | HR 72 | Temp 97.7°F | Ht 64.0 in | Wt 120.4 lb

## 2013-04-26 DIAGNOSIS — F5104 Psychophysiologic insomnia: Secondary | ICD-10-CM

## 2013-04-26 DIAGNOSIS — G4700 Insomnia, unspecified: Secondary | ICD-10-CM

## 2013-04-26 NOTE — Assessment & Plan Note (Signed)
The pt has significant insomnia that fits most appropriately with the psychophysiological type.  I suspect this is related to anxiety and concern over her dental issues, but now has developed into a sense of frustration about her sleep.  I have reviewed good sleep hygiene with her, as well as behavioral therapies that may help with her issues (stimulus control).  If she continues to have sleep problems that are not improving, would recommend going to behavioral specialist for CBT (cognitive behavioral therapy).

## 2013-04-26 NOTE — Progress Notes (Signed)
Subjective:    Patient ID: Christine Ford, female    DOB: August 27, 1961, 52 y.o.   MRN: [id number]  HPI The patient is a 52 year old female who had been asked to see for insomnia.  She has had significant sleep onset issues dating back to her teenage years, and states the last 9 years they have been worse.  She relates the worsening to concerns over her "bite issues", which developed after having some type of dental procedure.  The patient has taken some type of sleep aid for a few years, but despite this is not able to get to sleep.  She has been off Lunesta for the last one week, and thinks that she is able to fall asleep easier, but not maintain.  She will typically go to bed until around 11 PM, and will read in bed for relaxation but not watch television.  While on Lunesta, she would toss and turn and also clock watch.  She states that she would have significant "mind racing", and would worry about her teeth.  Since being off Lunesta for the last one week,  she will fall asleep reasonably fast, but will awaken between 2 and 3 AM with an inability to return to sleep.  Sometimes she can get back to sleep within an hour, or she may be up for the rest of the night until her arising time of 5:30 AM.  The patient usually stays in bed and does not leave the bedroom whenever this occurs.  She denies any significant environmental issues except her husband does breathe loudly at night.  She also wears earplugs to sleep since her teenage years.  She is unable to tolerate any noise while trying to sleep.  The patient does not feel rested in the mornings upon arising, although she denies significant daytime sleepiness with an Epworth score of 3.  She does take a 30 minute nap every afternoon.  She used to drink excessive caffeine to stay awake, but has cut this way back to one cup each morning.  The patient denies any snoring, kicking, or symptoms to suggest a movement disorder of sleep.   Sleep Questionnaire What time  do you typically go to bed?( Between what hours) 11p 11p at 0941 on 04/26/13 by Nita Sells, CMA How long does it take you to fall asleep? 5hrs 5hrs at 0941 on 04/26/13 by Nita Sells, CMA How many times during the night do you wake up? 4 4 at 0941 on 04/26/13 by Nita Sells, CMA What time do you get out of bed to start your day? 0600 0600 at 0941 on 04/26/13 by Nita Sells, CMA Do you drive or operate heavy machinery in your occupation? No No at 0941 on 04/26/13 by Nita Sells, CMA How much has your weight changed (up or down) over the past two years? (In pounds) 10 lb (4.536 kg)10 lb (4.536 kg) increased at 0941 on 04/26/13 by Nita Sells, CMA Have you ever had a sleep study before? No No at 0941 on 04/26/13 by Nita Sells, CMA Do you currently use CPAP? No No at 0941 on 04/26/13 by Nita Sells, CMA Do you wear oxygen at any time? No    Review of Systems  Constitutional: Positive for fatigue. Negative for fever and unexpected weight change.  HENT: Negative for ear pain, nosebleeds, congestion, sore throat, rhinorrhea, sneezing, trouble swallowing, dental problem, postnasal drip and sinus pressure.   Eyes: Negative for redness and  itching.  Respiratory: Negative for cough, chest tightness, shortness of breath and wheezing.   Cardiovascular: Negative for palpitations and leg swelling.  Gastrointestinal: Negative for nausea and vomiting.  Genitourinary: Negative for dysuria.  Musculoskeletal: Negative for joint swelling.  Skin: Negative for rash.  Neurological: Positive for headaches.  Hematological: Does not bruise/bleed easily.  Psychiatric/Behavioral: Negative for dysphoric mood. The patient is not nervous/anxious.        Objective:   Physical Exam Constitutional:  Well developed, no acute distress  HENT:  Nares patent without discharge, but narrowed bilat.  Oropharynx without exudate, palate and uvula are normal  Eyes:  Perrla, eomi, no scleral  icterus  Neck:  No JVD, no TMG  Cardiovascular:  Normal rate, regular rhythm, no rubs or gallops.  No murmurs        Intact distal pulses  Pulmonary :  Normal breath sounds, no stridor or respiratory distress   No rales, rhonchi, or wheezing  Abdominal:  Soft, nondistended, bowel sounds present.  No tenderness noted.   Musculoskeletal:  No lower extremity edema noted.  Lymph Nodes:  No cervical lymphadenopathy noted  Skin:  No cyanosis noted  Neurologic:  Alert, appropriate, moves all 4 extremities without obvious deficit.         Assessment & Plan:

## 2013-04-26 NOTE — Patient Instructions (Addendum)
Ritualistic behaviors such as reading, music, etc about before going to bed each night No tv or reading in bed.  Turn out lights and try to initiate sleep. If you are unable to go to sleep, never stay in bed more than 30-76min.  Go to family room to read or watch tv with lights outs.  No eating or drinking, no computer, no active or stimulating activities.  Do this as many times as it takes until you fall asleep or 7am arrives. No napping during day, no caffeine after 10am.  Exercise no closer than 4 hrs before bedtime. Can try melatonin 3mg  about 3-4 hrs BEFORE bedtime. Please call me in 2-3 weeks with update of how things are going.

## 2013-05-18 ENCOUNTER — Ambulatory Visit
Admission: RE | Admit: 2013-05-18 | Discharge: 2013-05-18 | Disposition: A | Discharge status: Home / Self Care | Payer: BC Managed Care – PPO | Source: Ambulatory Visit | Attending: Obstetrics and Gynecology | Admitting: Obstetrics and Gynecology

## 2013-05-18 DIAGNOSIS — Z1231 Encounter for screening mammogram for malignant neoplasm of breast: Secondary | ICD-10-CM

## 2013-06-08 ENCOUNTER — Institutional Professional Consult (permissible substitution): Payer: BC Managed Care – PPO | Admitting: Pulmonary Disease

## 2013-10-05 ENCOUNTER — Other Ambulatory Visit: Payer: Self-pay | Admitting: Internal Medicine

## 2013-10-06 NOTE — Telephone Encounter (Signed)
Ok to refill x 3 

## 2013-10-06 NOTE — Telephone Encounter (Signed)
Last filled on 03/10/13 #30 with 2 additional refills Had her CPE on 04/05/13 Has no future appt. Please advise. Thanks!

## 2014-04-11 ENCOUNTER — Encounter: Payer: Self-pay | Admitting: Internal Medicine

## 2014-04-11 LAB — HM COLONOSCOPY: HM COLON: NORMAL

## 2014-10-10 ENCOUNTER — Other Ambulatory Visit (INDEPENDENT_AMBULATORY_CARE_PROVIDER_SITE_OTHER): Payer: BC Managed Care – PPO

## 2014-10-10 DIAGNOSIS — Z0000 Encounter for general adult medical examination without abnormal findings: Secondary | ICD-10-CM

## 2014-10-10 LAB — HEPATIC FUNCTION PANEL
ALBUMIN: 3.8 g/dL (ref 3.5–5.2)
ALK PHOS: 49 U/L (ref 39–117)
ALT: 14 U/L (ref 0–35)
AST: 25 U/L (ref 0–37)
BILIRUBIN TOTAL: 0.8 mg/dL (ref 0.2–1.2)
Bilirubin, Direct: 0 mg/dL (ref 0.0–0.3)
Total Protein: 7.6 g/dL (ref 6.0–8.3)

## 2014-10-10 LAB — CBC WITH DIFFERENTIAL/PLATELET
BASOS ABS: 0 10*3/uL (ref 0.0–0.1)
Basophils Relative: 1.1 % (ref 0.0–3.0)
Eosinophils Absolute: 0.1 10*3/uL (ref 0.0–0.7)
Eosinophils Relative: 3.2 % (ref 0.0–5.0)
HEMATOCRIT: 41.1 % (ref 36.0–46.0)
HEMOGLOBIN: 13.7 g/dL (ref 12.0–15.0)
LYMPHS ABS: 1.4 10*3/uL (ref 0.7–4.0)
Lymphocytes Relative: 32.3 % (ref 12.0–46.0)
MCHC: 33.3 g/dL (ref 30.0–36.0)
MCV: 95.8 fl (ref 78.0–100.0)
MONOS PCT: 10 % (ref 3.0–12.0)
Monocytes Absolute: 0.4 10*3/uL (ref 0.1–1.0)
NEUTROS ABS: 2.3 10*3/uL (ref 1.4–7.7)
Neutrophils Relative %: 53.4 % (ref 43.0–77.0)
Platelets: 224 10*3/uL (ref 150.0–400.0)
RBC: 4.29 Mil/uL (ref 3.87–5.11)
RDW: 13.5 % (ref 11.5–15.5)
WBC: 4.3 10*3/uL (ref 4.0–10.5)

## 2014-10-10 LAB — BASIC METABOLIC PANEL
BUN: 16 mg/dL (ref 6–23)
CO2: 28 mEq/L (ref 19–32)
Calcium: 9.2 mg/dL (ref 8.4–10.5)
Chloride: 105 mEq/L (ref 96–112)
Creatinine, Ser: 0.8 mg/dL (ref 0.4–1.2)
GFR: 79.69 mL/min (ref >60.00)
Glucose, Bld: 64 mg/dL — ABNORMAL LOW (ref 70–99)
POTASSIUM: 4 meq/L (ref 3.5–5.1)
SODIUM: 138 meq/L (ref 135–145)

## 2014-10-10 LAB — LIPID PANEL
CHOL/HDL RATIO: 3
Cholesterol: 188 mg/dL (ref 0–200)
HDL: 57.4 mg/dL (ref >39.00)
LDL Cholesterol: 115 mg/dL — ABNORMAL HIGH (ref 0–99)
NonHDL: 130.6
Triglycerides: 79 mg/dL (ref 0.0–149.0)
VLDL: 15.8 mg/dL (ref 0.0–40.0)

## 2014-10-10 LAB — TSH: TSH: 0.8 u[IU]/mL (ref 0.35–4.50)

## 2014-10-17 ENCOUNTER — Encounter: Payer: Self-pay | Admitting: Internal Medicine

## 2014-10-17 ENCOUNTER — Ambulatory Visit (INDEPENDENT_AMBULATORY_CARE_PROVIDER_SITE_OTHER): Payer: BC Managed Care – PPO | Admitting: Internal Medicine

## 2014-10-17 VITALS — BP 126/84 | Temp 98.0°F | Ht 65.0 in | Wt 117.7 lb

## 2014-10-17 DIAGNOSIS — Z0000 Encounter for general adult medical examination without abnormal findings: Secondary | ICD-10-CM

## 2014-10-17 DIAGNOSIS — M8580 Other specified disorders of bone density and structure, unspecified site: Secondary | ICD-10-CM

## 2014-10-17 DIAGNOSIS — F5104 Psychophysiologic insomnia: Secondary | ICD-10-CM

## 2014-10-17 DIAGNOSIS — R413 Other amnesia: Secondary | ICD-10-CM

## 2014-10-17 MED ORDER — MIRTAZAPINE 15 MG PO TABS: 15.0000 mg | ORAL_TABLET | Freq: Every day | ORAL | Status: DC

## 2014-10-17 MED ORDER — ESZOPICLONE 2 MG PO TABS: 2.0000 mg | ORAL_TABLET | Freq: Every evening | ORAL | Status: DC | PRN

## 2014-10-17 NOTE — Progress Notes (Signed)
Pre visit review using our clinic review tool, if applicable. No additional management support is needed unless otherwise documented below in the visit note.  Chief Complaint  Patient presents with  . Annual Exam    sleep problems    HPI: Patient comes in today for Preventive Health Care visit . Has braces for 2 years jaw pain is better bite still has mild effusion which can interfere with sleep. Anxiety: worried  About sleep  Effecting job issue. Falls 2-4 am .   worried about sleep deprivation  And effecting ability to work.  1-5-6 hours takes Lunesta occasionally only has 5 pills left. Concern about sleep deprivation cognition she is taking 2 courses to try to complete a masters degree concern about mental fogginess. Asks about medicines that could contribute to Alzheimer's. Asks about getting a cognition evaluation.  Lots of stresses on obligations she is taking on.  Health Maintenance  Topic Date Due  . Influenza Vaccine  08/29/2015 (Originally 07/28/2014)  . Mammogram  05/19/2015  . Pap Smear  10/29/2015  . Tetanus/tdap  12/03/2016  . Colonoscopy  04/11/2024   Health Maintenance Review LIFESTYLE:  Exercise:  2 x per week.  Tobacco/ETS: no Alcohol:   ocass  Sugar beverages: no water coffee and ocass wine  Sleep: a problem doesn't think its menopause .   Bite involved .   Drug use: no had osteopenia   Stopped menses 48- 49 .  Colonoscopy: 4 15  5  year recall  ROS:  GEN/ HEENT: No fever, significant weight changes sweats headaches vision problems hearing changes, CV/ PULM; No chest pain shortness of breath cough, syncope,edema  change in exercise tolerance. GI /GU: No adominal pain, vomiting, change in bowel habits. No blood in the stool. No significant GU symptoms. SKIN/HEME: ,no acute skin rashes suspicious lesions or bleeding. No lymphadenopathy, nodules, masses.  NEURO/ PSYCH:  No neurologic signs such as weakness numbness.  States she gets anxious about sleep and not  getting a knot in worried about memory. Has had a sleep consult. IMM/ Allergy: No unusual infections.  Allergy .   REST of 12 system review negative except as per HPI   Past Medical History  Diagnosis Date  . Temporomandibular joint disorder (TMJ)   . Trouble in sleeping     in young teenage years  . Normal nuclear stress test     nl stress echo [name] 2002  . Osteopenia   . Arthritis     Family History  Problem Relation Age of Onset  . Coronary artery disease Mother   . Hypertension Mother   . Osteoporosis Mother   . COPD Mother   . Hypertension Father   . Heart attack Father   . Heart disease Father [id number]  . Glaucoma Son     congenital glaucomea has shunt in good eye one enucleated  . Vitiligo Son     younger  . Breast cancer      cousins  . Lung cancer      aunts, uncles, cousin  . Cancer Maternal Aunt     Lung    History   Social History  . Marital Status: Married    Spouse Name: N/A    Number of Children: 3  . Years of Education: N/A   Occupational History  . dental hygenist     [employer]   Social History Main Topics  . Smoking status: Never Smoker   . Smokeless tobacco: Never Used  Comment: Passive smoke exposure   . Alcohol Use: Yes     Comment: socially  . Drug Use: No  . Sexual Activity: None   Other Topics Concern  . 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 eszopiclone (LUNESTA) 2 MG TABS tablet Take 1 tablet (2 mg total) by mouth at bedtime as needed for sleep. Take immediately before bedtime  . Multiple Vitamins-Minerals (MULTIVITAMIN,TX-MINERALS) tablet Take 1 tablet by mouth daily.    .  [DISCONTINUED] LUNESTA 2 MG TABS tablet TAKE 1 TABLET BY MOUTH AT BEDTIME  . mirtazapine (REMERON) 15 MG tablet Take 1 tablet (15 mg total) by mouth at bedtime. For sleep    EXAM:  BP 126/84  Temp(Src) 98 F (36.7 C) (Oral)  Ht 5\' 5"  (1.651 m)  Wt 117 lb 11.2 oz (53.388 kg)  BMI 19.59 kg/m2  Body mass index is 19.59 kg/(m^2).  Physical Exam: Vital signs reviewed NWG:NFAOGEN:This is a well-developed well-nourished alert cooperative    who appearsr stated age in no acute distress.  HEENT: normocephalic atraumatic , Eyes: PERRL EOM's full, conjunctiva clear, Nares: paten,t no deformity discharge or tenderness., Ears: no deformity EAC's clear TMs with normal landmarks. Mouth: clear OP, no lesions, edema.  Moist mucous membranes. Dentition in adequate repair. Has braces on NECK: supple without masses, thyromegaly or bruits. CHEST/PULM:  Clear to auscultation and percussion breath sounds equal no wheeze , rales or rhonchi. No chest wall deformities or tenderness. Breast: normal by inspection . No dimpling, discharge, masses, tenderness or discharge . Breast implants CV: PMI is nondisplaced, S1 S2 no gallops, murmurs, rubs. Peripheral pulses are full without delay.No JVD .  ABDOMEN: Bowel sounds normal nontender  No guard or rebound, no hepato splenomegal no CVA tenderness.  No hernia. Extremtities:  No clubbing cyanosis or edema, no acute joint swelling or redness no focal atrophy NEURO:  Oriented x3, cranial nerves 3-12 appear to be intact, no obvious focal weakness,gait within normal limits no abnormal reflexes or asymmetrical SKIN: No acute rashes normal turgor, color, no bruising or petechiae. PSYCH: Oriented, good eye contact, no obvious depression anxiety, cognition and judgment appear normal. Emotional at times but normal speech LN: no cervical axillary inguinal adenopathy Has GYNE who does pelvic exams and Paps  Lab Results  Component Value Date   WBC 4.3 10/10/2014   HGB 13.7 10/10/2014     HCT 41.1 10/10/2014   PLT 224.0 10/10/2014   GLUCOSE 64* 10/10/2014   CHOL 188 10/10/2014   TRIG 79.0 10/10/2014   HDL 57.40 10/10/2014   LDLCALC 115* 10/10/2014   ALT 14 10/10/2014   AST 25 10/10/2014   NA 138 10/10/2014   K 4.0 10/10/2014   CL 105 10/10/2014   CREATININE 0.8 10/10/2014   BUN 16 10/10/2014   CO2 28 10/10/2014   TSH 0.80 10/10/2014    ASSESSMENT AND PLAN:  Discussed the following assessment and plan:  Visit for preventive health examination  Insomnia,  - Multifactorial stress physical  habits evaluated Dr. Sue Lush 2014  Osteopenia - Can get followup DEXA next year or 2016 order to be placed - Plan: DG Bone Density  Memory difficulty - perception patient asks about cognitive eval. sleep  can make appt with psychologist if wishes nl exam today.   Patient Care Team: Madelin Headings, MD as PCP - General Storm Frisk, MD (Pulmonary Disease) Oliver Pila, MD (Obstetrics and Gynecology) Patient Instructions  Continue lifestyle intervention healthy eating and exercise .  Healthy lifestyle includes : At least 150 minutes of exercise weeks  , weight at healthy levels, which is usually   BMI 19-25. Avoid trans fats and processed foods;  Increase fresh fruits and veges to 5 servings per day. And avoid sweet beverages including tea and juice. Mediterranean diet with olive oil and nuts have been noted to be heart and brain healthy . Avoid tobacco products . Limit  alcohol to  7 per week for women and 14 servings for men.  Get adequate sleep . Wear seat belts . Don't text and drive .   Can try    Dexa scan next year  Call for appt I will put in order.  Try remeron at night  To help .  Non dependent producing  lunesta with caution.  Habit forming     Neta Mends. Zuly Belkin M.D.  Extra time counseling regarding insomnia stress and meds risk benefit

## 2014-10-17 NOTE — Patient Instructions (Addendum)
Continue lifestyle intervention healthy eating and exercise .  Healthy lifestyle includes : At least 150 minutes of exercise weeks  , weight at healthy levels, which is usually   BMI 19-25. Avoid trans fats and processed foods;  Increase fresh fruits and veges to 5 servings per day. And avoid sweet beverages including tea and juice. Mediterranean diet with olive oil and nuts have been noted to be heart and brain healthy . Avoid tobacco products . Limit  alcohol to  7 per week for women and 14 servings for men.  Get adequate sleep . Wear seat belts . Don't text and drive .   Can try    Dexa scan next year  Call for appt I will put in order.  Try remeron at night  To help .  Non dependent producing  lunesta with caution.  Habit forming

## 2014-10-19 ENCOUNTER — Telehealth: Payer: Self-pay | Admitting: *Deleted

## 2014-10-19 DIAGNOSIS — Z205 Contact with and (suspected) exposure to viral hepatitis: Secondary | ICD-10-CM

## 2014-10-19 NOTE — Telephone Encounter (Signed)
Patient calling because she needs a tb skin test and a hep B titer.  Lab appointment made.  Order placed.

## 2014-10-22 ENCOUNTER — Other Ambulatory Visit (INDEPENDENT_AMBULATORY_CARE_PROVIDER_SITE_OTHER): Payer: BC Managed Care – PPO

## 2014-10-22 DIAGNOSIS — A184 Tuberculosis of skin and subcutaneous tissue: Secondary | ICD-10-CM

## 2014-10-22 DIAGNOSIS — Z1159 Encounter for screening for other viral diseases: Secondary | ICD-10-CM

## 2014-10-23 LAB — HEPATITIS B SURFACE ANTIBODY,QUALITATIVE: Hep B S Ab: POSITIVE — AB

## 2014-10-26 ENCOUNTER — Encounter: Payer: Self-pay | Admitting: Family Medicine

## 2014-11-05 ENCOUNTER — Ambulatory Visit (INDEPENDENT_AMBULATORY_CARE_PROVIDER_SITE_OTHER): Payer: BC Managed Care – PPO | Admitting: Family Medicine

## 2014-11-05 DIAGNOSIS — Z111 Encounter for screening for respiratory tuberculosis: Secondary | ICD-10-CM

## 2014-11-07 LAB — TB SKIN TEST
Induration: 0 mm
TB SKIN TEST: NEGATIVE

## 2014-12-19 ENCOUNTER — Telehealth: Payer: Self-pay

## 2014-12-19 NOTE — Telephone Encounter (Signed)
[facility] - Client TELEPHONE ADVICE RECORD [facility] Patient Name: Christine GheeKIM Que Gender: Female DOB: 09/15/1961 Age: 53 Y 4 M 19 D Return Phone Number: [phone] (Primary) Address: City/State/Zip: [place] [id number] Client [facility] - Client Client Site [facility] - Day Physician Berniece AndreasPanosh, Christine Contact Type Call Call Type Triage / Clinical Relationship To Patient Self Return Phone Number [phone] (Primary) Chief Complaint Back Pain - General Initial Comment Caller states concerned she may having UTI, having lower back pain where her kidney is PreDisposition Call Doctor Nurse Assessment Nurse: Anner CreteWells, RN, Olegario MessierKathy Date/Time (Eastern Time): 12/19/2014 2:23:12 PM Confirm and document reason for call. If symptomatic, describe symptoms. ---Caller states she is concerned she may have a UTI, she is having lower back pain where her kidney is . She denies fever, urinary urgency, frequency, loss of urine control or blood in her urine. Has the patient traveled out of the country within the last 30 days? ---Not Applicable Does the patient require triage? ---Yes Related visit to physician within the last 2 weeks? ---No Does the PT have any chronic conditions? (i.e. diabetes, asthma, etc.) ---No Did the patient indicate they were pregnant? ---No Guidelines Guideline Title Affirmed Question Affirmed Notes Nurse Date/Time (Eastern Time) Back Pain [1] MODERATE back pain (e.g., interferes with normal activities) AND [2] present > 3 days Anner CreteWells, RN, Olegario MessierKathy 12/19/2014 2:24:07 PM Disp. Time Lamount Cohen(Eastern Time) Disposition Final User 12/19/2014 2:26:27 PM See PCP When Office is Open (within 3 days) Yes Anner CreteWells, RN, Sammuel BailiffKathy Caller Understands: Yes Disagree/Comply: Comply PLEASE NOTE: All timestamps contained within this report are represented as [place] Standard Time. CONFIDENTIALTY NOTICE: This fax  transmission is intended only for the addressee. It contains information that is legally privileged, confidential or otherwise protected from use or disclosure. If you are not the intended recipient, you are strictly prohibited from reviewing, disclosing, copying using or disseminating any of this information or taking any action in reliance on or regarding this information. If you have received this fax in error, please notify us immediately by telephone so that we can arrange for its return to us. Phone: [phone], Toll-Free: [phone], Fax: [phone] Page: 2 of 2 Call Id: [id number] Care Advice Given Per Guideline SEE PCP WITHIN 3 DAYS: You need to be examined within 2 or 3 days. Call your doctor during regular office hours and make an appointment. (Note: if office will be open tomorrow, tell caller to call then, not in 3 days). * Avoid any activities that cause severe pain. * There are any urine symptoms or fever * You become worse. CARE ADVICE given per Back Pain (Adult) guideline. After Care Instructions Given Call Event Type User Date / Time Description Referrals REFERRED TO PCP OFFICE  Appt scheduled with Dr. Fabian SharpPanosh on 12.24.2015

## 2014-12-20 ENCOUNTER — Encounter: Payer: Self-pay | Admitting: Internal Medicine

## 2014-12-20 ENCOUNTER — Ambulatory Visit (INDEPENDENT_AMBULATORY_CARE_PROVIDER_SITE_OTHER): Payer: BC Managed Care – PPO | Admitting: Internal Medicine

## 2014-12-20 VITALS — BP 124/78 | Temp 97.8°F | Wt 117.9 lb

## 2014-12-20 DIAGNOSIS — M545 Low back pain, unspecified: Secondary | ICD-10-CM

## 2014-12-20 DIAGNOSIS — R109 Unspecified abdominal pain: Secondary | ICD-10-CM

## 2014-12-20 LAB — POCT URINALYSIS DIPSTICK
Bilirubin, UA: NEGATIVE
Glucose, UA: NEGATIVE
Ketones, UA: NEGATIVE
LEUKOCYTES UA: NEGATIVE
Nitrite, UA: NEGATIVE
PROTEIN UA: NEGATIVE
Spec Grav, UA: 1.015
UROBILINOGEN UA: 0.2
pH, UA: 6.5

## 2014-12-20 MED ORDER — IBUPROFEN 600 MG PO TABS: 600.0000 mg | ORAL_TABLET | Freq: Three times a day (TID) | ORAL | Status: DC

## 2014-12-20 NOTE — Progress Notes (Signed)
Chief Complaint  Patient presents with  . Back Pain    on the right sidex 1 week     HPI: Patient Christine Ford  comes in today for SDA for  new problem evaluation.  Onset about  2 weeks ago insidious onset  rlb pain local new pain no incites fever . Remote hx uti and had back pain with this . No blood took advil once localized  Worse with activity  .   No uti sx but hx of same  No blood in urine.   ROS: See pertinent positives and negatives per HPI. Some palpiataions poss from stress no cp sob new  Past Medical History  Diagnosis Date  . Temporomandibular joint disorder (TMJ)   . Trouble in sleeping     in young teenage years  . Normal nuclear stress test     nl stress echo [name] 2002  . Osteopenia   . Arthritis     Family History  Problem Relation Age of Onset  . Coronary artery disease Mother   . Hypertension Mother   . Osteoporosis Mother   . COPD Mother   . Hypertension Father   . Heart attack Father   . Heart disease Father 2059  . Glaucoma Son     congenital glaucomea has shunt in good eye one enucleated  . Vitiligo Son     younger  . Breast cancer      cousins  . Lung cancer      aunts, uncles, cousin  . Cancer Maternal Aunt     Lung    History   Social History  . Marital Status: Married    Spouse Name: N/A    Number of Children: 3  . Years of Education: N/A   Occupational History  . dental hygenist     [employer]   Social History Main Topics  . Smoking status: Never Smoker   . Smokeless tobacco: Never Used     Comment: Passive smoke exposure   . Alcohol Use: Yes     Comment: socially  . Drug Use: No  . Sexual Activity: None   Other Topics Concern  . 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 Multiple Vitamins-Minerals (MULTIVITAMIN,TX-MINERALS) tablet Take 1 tablet by mouth daily.    . eszopiclone (LUNESTA) 2 MG TABS tablet Take 1 tablet (2 mg total) by mouth at bedtime as needed for sleep. Take immediately before bedtime (Patient not taking: Reported on 12/20/2014)  . ibuprofen (ADVIL,MOTRIN) 600 MG tablet Take 1 tablet (600 mg total) by mouth every 8 (eight) hours. For 10 -1 4 days and then as needed  . mirtazapine (REMERON) 15 MG tablet Take 1 tablet (15 mg total) by mouth at bedtime. For sleep (Patient not taking: Reported on 12/20/2014)    EXAM:  BP 124/78 mmHg  Temp(Src) 97.8 F (36.6 C) (Oral)  Wt 117 lb 14.4 oz (53.479 kg)  Body mass index is 19.62 kg/(m^2).  GENERAL:  vitals reviewed and listed above, alert, oriented, appears well hydrated and in no acute distress HEENT: atraumatic, conjunctiva  clear, no obvious abnormalities on inspection of external nose and earsNECK: no obvious masses on inspection palpation  LUNGS: clear to auscultation bilaterally, no wheezes, rales or rhonchi, good air movement CV: HRRR, no clubbing cyanosis or  peripheral edema nl cap refill  MS: moves all extremities without noticeable focal  Abnormality local pain si area  Some spasm  Nl gait  Abdomen:  Sof,t normal bowel sounds without hepatosplenomegaly, no guarding rebound or masses no CVA tenderness PSYCH: pleasant and cooperative, no obvious depression or anxiety UA is clear  DS ASSESSMENT AND PLAN:  Discussed the following assessment and plan:  Right-sided low back pain without sciatica - new onset near si joint area with some spasm ua neg no sstemic sx   Flank pain - Plan: POCT urinalysis dipstick, POCT urinalysis dipstick nsaid trial   Fu depending on course    Expectant management.  -Patient advised to return or notify health care team  if symptoms worsen ,persist or new  concerns arise.  Patient Instructions  Acts more  Like  musculo skeletal  Around   SI  joint .  Antiinflammatory  Course for 10 - 14 days . Then  If ongoing  Then consider seeing sports medicine .      Neta MendsWanda K. Markeith Jue M.D.  Pre visit review using our clinic review tool, if applicable. No additional management support is needed unless otherwise documented below in the visit note.

## 2014-12-20 NOTE — Patient Instructions (Addendum)
Acts more  Like  musculo skeletal  Around   SI  joint .  Antiinflammatory  Course for 10 - 14 days . Then  If ongoing  Then consider seeing sports medicine .

## 2015-01-25 ENCOUNTER — Telehealth: Payer: Self-pay | Admitting: Pulmonary Disease

## 2015-01-25 NOTE — Telephone Encounter (Signed)
Patient called office back. She did do more [facility] and [facility].  [facility]  Nothing further needed at this time.

## 2015-02-04 ENCOUNTER — Ambulatory Visit (INDEPENDENT_AMBULATORY_CARE_PROVIDER_SITE_OTHER): Payer: BLUE CROSS/BLUE SHIELD | Admitting: Pulmonary Disease

## 2015-02-04 ENCOUNTER — Encounter (INDEPENDENT_AMBULATORY_CARE_PROVIDER_SITE_OTHER): Payer: Self-pay

## 2015-02-04 ENCOUNTER — Encounter: Payer: Self-pay | Admitting: Pulmonary Disease

## 2015-02-04 VITALS — BP 122/70 | HR 61 | Temp 97.1°F | Ht 64.0 in | Wt 121.0 lb

## 2015-02-04 DIAGNOSIS — F5104 Psychophysiologic insomnia: Secondary | ICD-10-CM

## 2015-02-04 DIAGNOSIS — F411 Generalized anxiety disorder: Secondary | ICD-10-CM

## 2015-02-04 NOTE — Progress Notes (Addendum)
   Subjective:    Patient ID: Christine Ford, female    DOB: 01/16/1961, 54 y.o.   MRN: [id number]  HPI The patient comes in today for follow-up of her known insomnia and other sleeping issues. 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 I recommended good sleep hygiene, stimulus control therapy, and consideration of cognitive behavioral therapy with a psychologist. She comes in today where she continues to have significant sleeping issues, and she feels that her ongoing chronic dental issues are contributing to her insomnia because of discomfort. She does not feel that behavioral therapy is going to help her, but did discuss some of the stressors in her life. She is asking about the possibility of a sleep study.   Review of Systems  Constitutional: Negative for fever and unexpected weight change.  HENT: Negative for congestion, dental problem, ear pain, nosebleeds, postnasal drip, rhinorrhea, sinus pressure, sneezing, sore throat and trouble swallowing.   Eyes: Negative for redness and itching.  Respiratory: Negative for cough, chest tightness, shortness of breath and wheezing.   Cardiovascular: Negative for palpitations and leg swelling.  Gastrointestinal: Negative for nausea and vomiting.  Genitourinary: Negative for dysuria.  Musculoskeletal: Negative for joint swelling.  Skin: Negative for rash.  Neurological: Negative for headaches.  Hematological: Does not bruise/bleed easily.  Psychiatric/Behavioral: Negative for dysphoric mood. The patient is not nervous/anxious.        Objective:   Physical Exam Well-developed female in no acute distress Alert and oriented, moves all 4 extremities without obvious deficit.       Assessment & Plan:

## 2015-02-04 NOTE — Assessment & Plan Note (Addendum)
The patient continues to have significant sleeping issues that are impacting her quality of life. Her history is most consistent with psychophysiological insomnia, however she is also having issues with jaw discomfort and anxiety. She is asking about the possibility of doing a sleep study, but I think it would be a very low yield. She has no history to support the diagnosis of obstructive sleep apnea, although mandibular issues can cause sleep disordered breathing. She denies any history to suggest the restless leg syndrome. She is very frustrated about all of this, and I am willing to do the sleep study for completeness if she feels strongly about it. She wishes to think about this, and will let me know. In the meantime, I really think the best treatment for her sleep issues is cognitive behavioral therapy, aggressive treatment of her anxiety/stress with a behavioral therapist, and addressing her dental issues.  I will leave that to her primary care physician. In the meantime, the patient will call me if she feels strongly about doing a sleep study to rule out superimposed sleep disorders.  I have spent 31 minutes today discussing the above.

## 2015-02-04 NOTE — Patient Instructions (Signed)
Continue working on sleep hygiene as we have discussed. Will send a note to your primary physician recommending cognitive behavioral therapy Continue working with dental medicine for your jaw issues Let me know if you wish to consider a sleep study.

## 2015-02-05 ENCOUNTER — Telehealth: Payer: Self-pay | Admitting: Pulmonary Disease

## 2015-02-05 ENCOUNTER — Telehealth: Payer: Self-pay | Admitting: Internal Medicine

## 2015-02-05 NOTE — Telephone Encounter (Signed)
Pt aware.

## 2015-02-05 NOTE — Telephone Encounter (Signed)
Pt wants a copy of the letter dr clance wrote to dr Fabian Sharppanosh.at the end of her asv from him.  Can you run that for her?

## 2015-02-06 NOTE — Telephone Encounter (Signed)
lmomtcb x1 for pt on home # provided. Called mobile # and LMTCB x1 Advised on VM to ask for Christine Ford directly.

## 2015-02-07 ENCOUNTER — Telehealth: Payer: Self-pay | Admitting: Internal Medicine

## 2015-02-07 NOTE — Telephone Encounter (Signed)
Called spoke with pt. She is wanting to speak with manager/director. I spoke with TD regarding this.

## 2015-02-07 NOTE — Telephone Encounter (Signed)
Pt change mind

## 2015-02-07 NOTE — Telephone Encounter (Signed)
Pt called back, [phone]

## 2015-02-11 NOTE — Telephone Encounter (Signed)
Mindy, please advise if this message can be closed. Thanks.

## 2015-02-12 ENCOUNTER — Encounter: Payer: Self-pay | Admitting: Interventional Cardiology

## 2015-02-12 ENCOUNTER — Encounter: Payer: Self-pay | Admitting: Internal Medicine

## 2015-02-12 ENCOUNTER — Ambulatory Visit (INDEPENDENT_AMBULATORY_CARE_PROVIDER_SITE_OTHER): Payer: BLUE CROSS/BLUE SHIELD | Admitting: Internal Medicine

## 2015-02-12 DIAGNOSIS — G479 Sleep disorder, unspecified: Secondary | ICD-10-CM

## 2015-02-12 NOTE — Telephone Encounter (Signed)
You will have to ask TD. I am unsure if she spoke with her or not

## 2015-02-12 NOTE — Patient Instructions (Signed)
[facility].. [facility] as [facility].  Have them send us  Report .  Glad the dental situation is getting better.

## 2015-02-12 NOTE — Progress Notes (Signed)
Chief Complaint  Patient presents with  . Follow-up    HPI: Christine Ford 54 y.o. comesin concern about her sleep and to discuss sleep consult . Sleep is improved since dental issues are in a good state and discomfort is relieved some when awakens can go back to sleep . Situational stresses are better  And not related to the sleep at this time.  The information is last notes were incorrect and she is working on having this corrected .  TEETH issues getting better   ROS: See pertinent positives and negatives per HPI.  Past Medical History  Diagnosis Date  . Temporomandibular joint disorder (TMJ)   . 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 Normal nuclear stress test     nl stress echo [name] 2002  . Osteopenia   . Arthritis     Family History  Problem Relation Age of Onset  . Coronary artery disease Mother   . Hypertension Mother   . Osteoporosis Mother   . COPD Mother   . Hypertension Father   . Heart attack Father   . Heart disease Father 1859  . Glaucoma Son     congenital glaucomea has shunt in good eye one enucleated  . Vitiligo Son     younger  . Breast cancer      cousins  . Lung cancer      aunts, uncles, cousin  . Cancer Maternal Aunt     Lung    History   Social History  . Marital Status: Married    Spouse Name: N/A  . Number of Children: 3  . Years of Education: N/A   Occupational History  . dental hygenist     [employer]   Social History Main Topics  . Smoking status: Never Smoker   . Smokeless tobacco: Never Used     Comment: Passive smoke exposure   . Alcohol Use: Yes     Comment: socially  . Drug Use: No  . Sexual Activity: Not on file   Other Topics Concern  . 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 Multiple Vitamins-Minerals (MULTIVITAMIN,TX-MINERALS) tablet Take 1 tablet by mouth daily.      EXAM:  BP 136/72 mmHg  Temp(Src) 98 F (36.7 C)  Wt 118 lb (53.524 kg)  Body mass index is 20.24 kg/(m^2).  GENERAL: vitals reviewed and listed above, alert, oriented, appears well hydrated and in no acute distress HEENT: atraumatic, conjunctiva  clear, no obvious abnormalities on inspection of external nose and ears wearing her braces  Looks welll PSYCH: pleasant and cooperative, no obvious depression or anxiety reviewed sleep consult note  She reveiwed this with me  In detail.  ASSESSMENT AND PLAN:  Discussed the following assessment and plan:  Sleep disorder - sleep phase documented as a teen  better now with dental issues improved and can get back  to sleep easier dont think  anxiety is thcause of her current problem  Dental intervention over the last     30 months  .. finally has helped  Her   Ability to go back to sleep  After wakenings . I dont think anxiety if a primary cause of her sleep issue and  Stress situational factors , actually doing  better  . Patient herself does sleep hygiene and  Interventions .  Agree with opinion  [facility] where orig sleep study was done.  Iwhen she was a teenager,   -Patient advised to return or notify health care team  if symptoms worsen ,persist or new concerns arise. Or when due   Patient Instructions  I agree.. With the [facility]  Evaluation as discussed.  Have them send [place]  Report .  Glad the dental situation is getting better.       Neta Mends. Panosh M.D.

## 2015-02-12 NOTE — Telephone Encounter (Signed)
Spoke with Philipp Deputyammy Davis.  She has this info and is going to contact pt.

## 2015-02-12 NOTE — Progress Notes (Signed)
Pre visit review using our clinic review tool, if applicable. No additional management support is needed unless otherwise documented below in the visit note. 

## 2015-02-19 NOTE — Telephone Encounter (Signed)
Spoke with TD, states she's been trying to get in contact with this patient for over a week about this.  Will close note at TD's request.  New message can be started if pt calls back.

## 2015-02-19 NOTE — Telephone Encounter (Signed)
TD please advise on the status of this.

## 2015-04-08 DIAGNOSIS — R400 Somnolence: Secondary | ICD-10-CM | POA: Insufficient documentation

## 2015-04-08 DIAGNOSIS — R5382 Chronic fatigue, unspecified: Secondary | ICD-10-CM | POA: Insufficient documentation

## 2015-05-07 ENCOUNTER — Telehealth: Payer: Self-pay | Admitting: Pulmonary Disease

## 2015-05-07 NOTE — Telephone Encounter (Addendum)
Request for Amendment form received. Forwarded to Marcelyn BruinsKeith Clance MD to review 05/07/15 [facility]

## 2015-06-17 ENCOUNTER — Encounter: Payer: Self-pay | Admitting: Family Medicine

## 2015-06-26 ENCOUNTER — Inpatient Hospital Stay: Admission: RE | Admit: 2015-06-26 | Payer: BLUE CROSS/BLUE SHIELD | Source: Ambulatory Visit

## 2015-06-27 ENCOUNTER — Ambulatory Visit (INDEPENDENT_AMBULATORY_CARE_PROVIDER_SITE_OTHER)
Admission: RE | Admit: 2015-06-27 | Discharge: 2015-06-27 | Disposition: A | Discharge status: Home / Self Care | Payer: BLUE CROSS/BLUE SHIELD | Source: Ambulatory Visit | Attending: Internal Medicine | Admitting: Internal Medicine

## 2015-06-27 DIAGNOSIS — M8580 Other specified disorders of bone density and structure, unspecified site: Secondary | ICD-10-CM | POA: Diagnosis not present

## 2015-07-03 ENCOUNTER — Telehealth: Payer: Self-pay | Admitting: Family Medicine

## 2015-07-03 ENCOUNTER — Other Ambulatory Visit: Payer: Self-pay | Admitting: Family Medicine

## 2015-07-03 DIAGNOSIS — M810 Age-related osteoporosis without current pathological fracture: Secondary | ICD-10-CM

## 2015-07-03 NOTE — Telephone Encounter (Signed)
Pt has made a lab appt on 07/15/15.  She would like a follow up visit to discuss labs and new osteoporosis discovery.  Please help the pt to make an appt.  Thanks!

## 2015-07-03 NOTE — Telephone Encounter (Signed)
Pt has been sch

## 2015-07-04 ENCOUNTER — Other Ambulatory Visit (INDEPENDENT_AMBULATORY_CARE_PROVIDER_SITE_OTHER): Payer: BLUE CROSS/BLUE SHIELD

## 2015-07-04 DIAGNOSIS — Z13820 Encounter for screening for osteoporosis: Secondary | ICD-10-CM | POA: Diagnosis not present

## 2015-07-04 DIAGNOSIS — M810 Age-related osteoporosis without current pathological fracture: Secondary | ICD-10-CM

## 2015-07-04 NOTE — Addendum Note (Signed)
Addended by: Eustace QuailEABOLD, Adrick Kestler J on: 07/04/2015 11:20 AM   Modules accepted: Orders

## 2015-07-05 LAB — VITAMIN D 25 HYDROXY (VIT D DEFICIENCY, FRACTURES): VIT D 25 HYDROXY: 39 ng/mL (ref 30–100)

## 2015-07-15 ENCOUNTER — Other Ambulatory Visit: Payer: BLUE CROSS/BLUE SHIELD

## 2015-07-29 ENCOUNTER — Encounter: Payer: Self-pay | Admitting: Internal Medicine

## 2015-07-29 ENCOUNTER — Ambulatory Visit (INDEPENDENT_AMBULATORY_CARE_PROVIDER_SITE_OTHER): Payer: BLUE CROSS/BLUE SHIELD | Admitting: Internal Medicine

## 2015-07-29 VITALS — BP 140/80 | Temp 98.0°F | Wt 117.3 lb

## 2015-07-29 DIAGNOSIS — Z8262 Family history of osteoporosis: Secondary | ICD-10-CM

## 2015-07-29 DIAGNOSIS — M810 Age-related osteoporosis without current pathological fracture: Secondary | ICD-10-CM | POA: Diagnosis not present

## 2015-07-29 LAB — TSH: TSH: 1.98 u[IU]/mL (ref 0.35–4.50)

## 2015-07-29 LAB — T3, FREE: T3 FREE: 2.9 pg/mL (ref 2.3–4.2)

## 2015-07-29 LAB — T4, FREE: FREE T4: 0.86 ng/dL (ref 0.60–1.60)

## 2015-07-29 NOTE — Patient Instructions (Signed)
Get thyroid parathyroid labs and celiac  Screen.   Optimize calcium  Vit d as you a are doing .   Repeat dexa scan in 2 years .      Osteoporosis Throughout your life, your body breaks down old bone and replaces it with new bone. As you get older, your body does not replace bone as quickly as it breaks it down. By the age of 30 years, most people begin to gradually lose bone because of the imbalance between bone loss and replacement. Some people lose more bone than others. Bone loss beyond a specified normal degree is considered osteoporosis.  Osteoporosis affects the strength and durability of your bones. The inside of the ends of your bones and your flat bones, like the bones of your pelvis, look like honeycomb, filled with tiny open spaces. As bone loss occurs, your bones become less dense. This means that the open spaces inside your bones become bigger and the walls between these spaces become thinner. This makes your bones weaker. Bones of a person with osteoporosis can become so weak that they can break (fracture) during minor accidents, such as a simple fall. CAUSES  The following factors have been associated with the development of osteoporosis:  Smoking.  Drinking more than 2 alcoholic drinks several days per week.  Long-term use of certain medicines:  Corticosteroids.  Chemotherapy medicines.  Thyroid medicines.  Antiepileptic medicines.  Gonadal hormone suppression medicine.  Immunosuppression medicine.  Being underweight.  Lack of physical activity.  Lack of exposure to the sun. This can lead to vitamin D deficiency.  Certain medical conditions:  Certain inflammatory bowel diseases, such as Crohn disease and ulcerative colitis.  Diabetes.  Hyperthyroidism.  Hyperparathyroidism. RISK FACTORS Anyone can develop osteoporosis. However, the following factors can increase your risk of developing osteoporosis:  Gender--Women are at higher risk than  men.  Age--Being older than 50 years increases your risk.  Ethnicity--White and Asian people have an increased risk.  Weight --Being extremely underweight can increase your risk of osteoporosis.  Family history of osteoporosis--Having a family member who has developed osteoporosis can increase your risk. SYMPTOMS  Usually, people with osteoporosis have no symptoms.  DIAGNOSIS  Signs during a physical exam that may prompt your caregiver to suspect osteoporosis include:  Decreased height. This is usually caused by the compression of the bones that form your spine (vertebrae) because they have weakened and become fractured.  A curving or rounding of the upper back (kyphosis). To confirm signs of osteoporosis, your caregiver may request a procedure that uses 2 low-dose X-ray beams with different levels of energy to measure your bone mineral density (dual-energy X-ray absorptiometry [DXA]). Also, your caregiver may check your level of vitamin D. TREATMENT  The goal of osteoporosis treatment is to strengthen bones in order to decrease the risk of bone fractures. There are different types of medicines available to help achieve this goal. Some of these medicines work by slowing the processes of bone loss. Some medicines work by increasing bone density. Treatment also involves making sure that your levels of calcium and vitamin D are adequate. PREVENTION  There are things you can do to help prevent osteoporosis. Adequate intake of calcium and vitamin D can help you achieve optimal bone mineral density. Regular exercise can also help, especially resistance and weight-bearing activities. If you smoke, quitting smoking is an important part of osteoporosis prevention. MAKE SURE YOU:  Understand these instructions.  Will watch your condition.  Will get help right  away if you are not doing well or get worse. FOR MORE INFORMATION www.osteo.org and [email or web address] Document Released: 09/23/2005 Document  Revised: 04/10/2013 Document Reviewed: 11/28/2011 [facility] Patient Information 2015 [place], [place]. This information is not intended to replace advice given to you by your health care provider. Make sure you discuss any questions you have with your health care provider.

## 2015-07-29 NOTE — Progress Notes (Signed)
Pre visit review using our clinic review tool, if applicable. No additional management support is needed unless otherwise documented below in the visit note.  Chief Complaint  Patient presents with  . Follow-up    HPI: Christine Ford 54 y.o. comes in for fu of labs and dexa  Showing borderline osteoporosis -2.6 in the hip  She has no history of fracture doesn't to milk but is been taking now calcium magnesium and some Tom's and citrates. Ever since she got the results of her scan. She is on no regular medicines family history of osteoporosis and her mom negative hip fracture. Asks about thyroid and other possible causes of osteoporosis. She is just postmenopausal may have had gone through this when she was 49-50. ROS: See pertinent positives and negatives per HPI. 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 No limping. She is going to see an orthopedic sports medicine at [facility] orthopedics. There is no family history of celiac disease however she herself has gone to a more gluten-free diet over the last 3-4 months and he has decreased bloating in her abdomen and she feels better. Jaw pain is much better as well as headaches now she has a almost normal bite and is pleased with her orthodontic outcome.  Past Medical History  Diagnosis Date  . Temporomandibular joint disorder (TMJ)   . 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 Normal nuclear stress test     nl stress echo [name] 2002  . Osteopenia   . Arthritis     Family History  Problem Relation Age of Onset  . Coronary artery disease Mother   . Hypertension Mother   . Osteoporosis Mother   . COPD Mother   . Hypertension Father   . Heart attack Father   . Heart disease  Father 42  . Glaucoma Son     congenital glaucomea has shunt in good eye one enucleated  . Vitiligo Son     younger  . Breast cancer      cousins  . Lung cancer      aunts, uncles, cousin  . Cancer Maternal Aunt     Lung    History   Social History  . Marital Status: Married    Spouse Name: N/A  . Number of Children: 3  . Years of Education: N/A   Occupational History  . dental hygenist     [employer]   Social History Main Topics  . Smoking status: Never Smoker   . Smokeless tobacco: Never Used     Comment: Passive smoke exposure   . Alcohol Use: Yes     Comment: socially  . Drug Use: No  . Sexual Activity: Not on file   Other Topics Concern  . 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 Multiple Vitamins-Minerals (MULTIVITAMIN,TX-MINERALS) tablet Take 1 tablet by mouth daily.       No facility-administered medications prior to visit.     EXAM:  BP 140/80 mmHg  Temp(Src) 98 F (36.7 C) (Oral)  Wt 117 lb 4.8 oz (53.207 kg)  Body mass index is 20.12 kg/(m^2).  GENERAL: vitals reviewed and listed above, alert, oriented, appears well hydrated and in no acute distress HEENT: atraumatic, conjunctiva  clear, no obvious abnormalities on inspection of external nose and ears MS: moves all extremities without noticeable focal  abnormality PSYCH: pleasant and cooperative, no obvious depression or anxiety reviewed data w patient ASSESSMENT AND PLAN:  Discussed the following assessment and plan:  Osteoporosis - -2.6 lefthip -2.4 right lab fam hx seetext - Plan: TSH, T4, free, T3, free, PTH, intact and calcium, Celiac panel  10  Family history of osteoporosis in mother We'll get repeat thyroid with free T3 free T4 PTH which wasn't done and celiac panel although she has been fairly gluten-free for the last 3 or 4 months. She has no history of iron deficiency anemia recently. Discussed bone density options at this point she is probably in her postmenopausal phase decline beef up her calcium vitamin D and weightbearing exercises and repeat her bone density in 2 years. -Patient advised to return or notify health care team  if symptoms worsen ,persist or new concerns arise. Hip pain agree seeing [facility] continue weights   Patient Instructions  Get thyroid parathyroid labs and celiac  Screen.   Optimize calcium  Vit d as you a are doing .   Repeat dexa scan in 2 years .      Osteoporosis Throughout your life, your body breaks down old bone and replaces it with new bone. As you get older, your body does not replace bone as quickly as it breaks it down. By the age of 30 years, most people begin to gradually lose bone because of the imbalance between bone loss and replacement. Some people lose more bone than others. Bone loss beyond a specified normal degree is considered osteoporosis.  Osteoporosis affects the strength and durability of your bones. The inside of the ends of your bones and your flat bones, like the bones of your pelvis, look like honeycomb, filled with tiny open spaces. As bone loss occurs, your bones become less dense. This means that the open spaces inside your bones become bigger and the walls between these spaces become thinner. This makes your bones weaker. Bones of a person with osteoporosis can become so weak that they can break (fracture) during minor accidents, such as a simple fall. CAUSES  The following factors have been associated with the development of osteoporosis:  Smoking.  Drinking more than 2 alcoholic drinks several days per week.  Long-term use of certain  medicines:  Corticosteroids.  Chemotherapy medicines.  Thyroid medicines.  Antiepileptic medicines.  Gonadal hormone suppression medicine.  Immunosuppression medicine.  Being underweight.  Lack of physical activity.  Lack of exposure to the sun. This can lead to vitamin D deficiency.  Certain medical conditions:  Certain inflammatory bowel diseases, such as Crohn disease and ulcerative colitis.  Diabetes.  Hyperthyroidism.  Hyperparathyroidism. RISK FACTORS Anyone can develop osteoporosis. However, the following factors can increase your risk of developing osteoporosis:  Gender--Women are at higher risk than men.  Age--Being older than 50 years increases your risk.  Ethnicity--White and Asian people have an increased  risk.  Weight --Being extremely underweight can increase your risk of osteoporosis.  Family history of osteoporosis--Having a family member who has developed osteoporosis can increase your risk. SYMPTOMS  Usually, people with osteoporosis have no symptoms.  DIAGNOSIS  Signs during a physical exam that may prompt your caregiver to suspect osteoporosis include:  Decreased height. This is usually caused by the compression of the bones that form your spine (vertebrae) because they have weakened and become fractured.  A curving or rounding of the upper back (kyphosis). To confirm signs of osteoporosis, your caregiver may request a procedure that uses 2 low-dose X-ray beams with different levels of energy to measure your bone mineral density (dual-energy X-ray absorptiometry [DXA]). Also, your caregiver may check your level of vitamin D. TREATMENT  The goal of osteoporosis treatment is to strengthen bones in order to decrease the risk of bone fractures. There are different types of medicines available to help achieve this goal. Some of these medicines work by slowing the processes of bone loss. Some medicines work by increasing bone density. Treatment also  involves making sure that your levels of calcium and vitamin D are adequate. PREVENTION  There are things you can do to help prevent osteoporosis. Adequate intake of calcium and vitamin D can help you achieve optimal bone mineral density. Regular exercise can also help, especially resistance and weight-bearing activities. If you smoke, quitting smoking is an important part of osteoporosis prevention. MAKE SURE YOU:  Understand these instructions.  Will watch your condition.  Will get help right away if you are not doing well or get worse. FOR MORE INFORMATION www.osteo.org and [email or web address] Document Released: 09/23/2005 Document Revised: 04/10/2013 Document Reviewed: 11/28/2011 [facility] Patient Information 2015 [place], [place]. This information is not intended to replace advice given to you by your health care provider. Make sure you discuss any questions you have with your health care provider.      Neta Mends. Panosh M.D.

## 2015-07-30 LAB — PTH, INTACT AND CALCIUM
CALCIUM: 9.3 mg/dL (ref 8.4–10.5)
PTH: 40 pg/mL (ref 14–64)

## 2015-07-30 LAB — CELIAC PANEL 10
Endomysial Screen: NEGATIVE
GLIADIN IGG: 2 U (ref <20)
Gliadin IgA: 6 Units (ref <20)
IgA: 204 mg/dL (ref 69–380)
Tissue Transglut Ab: 1 U/mL (ref <6)
Tissue Transglutaminase Ab, IgA: 1 U/mL (ref <4)

## 2015-08-02 ENCOUNTER — Ambulatory Visit: Payer: BLUE CROSS/BLUE SHIELD | Admitting: Internal Medicine

## 2016-10-26 DIAGNOSIS — D2261 Melanocytic nevi of right upper limb, including shoulder: Secondary | ICD-10-CM | POA: Diagnosis not present

## 2016-10-26 DIAGNOSIS — I788 Other diseases of capillaries: Secondary | ICD-10-CM | POA: Diagnosis not present

## 2016-10-26 DIAGNOSIS — L821 Other seborrheic keratosis: Secondary | ICD-10-CM | POA: Diagnosis not present

## 2016-10-26 DIAGNOSIS — D1801 Hemangioma of skin and subcutaneous tissue: Secondary | ICD-10-CM | POA: Diagnosis not present

## 2016-11-17 DIAGNOSIS — Z1231 Encounter for screening mammogram for malignant neoplasm of breast: Secondary | ICD-10-CM | POA: Diagnosis not present

## 2016-12-19 ENCOUNTER — Encounter (HOSPITAL_COMMUNITY): Payer: Self-pay

## 2016-12-19 ENCOUNTER — Emergency Department (HOSPITAL_COMMUNITY)
Admission: EM | Admit: 2016-12-19 | Discharge: 2016-12-19 | Disposition: A | Discharge status: Home / Self Care | Payer: BLUE CROSS/BLUE SHIELD | Attending: Emergency Medicine | Admitting: Emergency Medicine

## 2016-12-19 DIAGNOSIS — W260XXA Contact with knife, initial encounter: Secondary | ICD-10-CM | POA: Diagnosis not present

## 2016-12-19 DIAGNOSIS — Y929 Unspecified place or not applicable: Secondary | ICD-10-CM | POA: Insufficient documentation

## 2016-12-19 DIAGNOSIS — Z79899 Other long term (current) drug therapy: Secondary | ICD-10-CM | POA: Diagnosis not present

## 2016-12-19 DIAGNOSIS — Y9389 Activity, other specified: Secondary | ICD-10-CM | POA: Insufficient documentation

## 2016-12-19 DIAGNOSIS — S61213A Laceration without foreign body of left middle finger without damage to nail, initial encounter: Secondary | ICD-10-CM

## 2016-12-19 DIAGNOSIS — Y999 Unspecified external cause status: Secondary | ICD-10-CM | POA: Insufficient documentation

## 2016-12-19 DIAGNOSIS — Z23 Encounter for immunization: Secondary | ICD-10-CM | POA: Diagnosis not present

## 2016-12-19 MED ORDER — TETANUS-DIPHTH-ACELL PERTUSSIS 5-2.5-18.5 LF-MCG/0.5 IM SUSP
0.5000 mL | Freq: Once | INTRAMUSCULAR | Status: AC
  Administered 2016-12-19: 0.5 mL via INTRAMUSCULAR
  Filled 2016-12-19: qty 0.5

## 2016-12-19 NOTE — Discharge Instructions (Signed)
Keep wound clean with mild soap and water. Keep area covered with a topical antibiotic ointment and bandage, keep bandage dry, and do not submerge in water for 24 hours. Ice and elevate for additional pain relief and swelling. Alternate between Ibuprofen and Tylenol for additional pain relief. Follow up with your primary care doctor or the [facility] in approximately 3 days for wound recheck. Monitor area for signs of infection to include, but not limited to: increasing pain, spreading redness, drainage/pus, worsening swelling, or fevers. Return to emergency department for emergent changing or worsening symptoms.

## 2016-12-19 NOTE — ED Triage Notes (Signed)
Pt c/o L middle finger laceration.  Pain score 6/10.  Pt reports that she was cutting bread and the knife slipped.  Bleeding controlled.  Pt continually applying pressure.

## 2016-12-19 NOTE — ED Provider Notes (Signed)
[facility]-EMERGENCY DEPT Provider Note   CSN: [id number] Arrival date & time: 12/19/16  1447  By signing my name below, I, Majel Homer, attest that this documentation has been prepared under the direction and in the presence of non-physician practitioner, [address] [place] Amya Hlad, PA-C. Electronically Signed: Majel Homer, Scribe. 12/19/2016. 3:32 PM.  History   Chief Complaint Chief Complaint  Patient presents with  . Extremity Laceration   The history is provided by the patient. No language interpreter was used.  Laceration   The incident occurred 1 to 2 hours ago. The laceration is located on the left hand. The laceration is 1 cm in size. The laceration mechanism was a a clean knife. The pain is at a severity of 6/10. The pain is mild. The pain has been constant since onset. She reports no foreign bodies present. Her tetanus status is unknown.   HPI Comments: Christine Ford is a 55 y.o. female with PMHx of osteopenia and arthritis, who presents to the Emergency Department for an evaluation of a sudden onset laceration to her left third digit that occurred ~1 hour PTA. Pt reports she was slicing bread this afternoon when her knife "slipped" causing her to cut her finger "very hard." She notes the blood began to "ooze" from the area; however, she has applied "a large amount of pressure" to her finger to stop the bleeding, and there is no ongoing bleeding now. She now describes her pain as 6/10, constant, "stinging," non-radiating L middle finger pain around the cut, that is exacerbated with movement. Has not tried anything else aside from pressure to help with pain, and pressure being applied helps with the pain. She denies numbness, tingling, loss of ROM, or weakness. Pt reports she is unsure of her last tetanus immunization.   Past Medical History:  Diagnosis Date  . Arthritis   . Normal nuclear stress test    nl stress echo [name] 2002  . Osteopenia   . Temporomandibular joint disorder (TMJ)   .  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 Right-sided low back pain without sciatica 12/20/2014  . Osteopenia 10/17/2014  . Family history of breast cancer 06/03/2012  . Visit for preventive health examination 11/23/2011  . Axillary pain 06/23/2011  . NECK PAIN 04/23/2010  . GERD 09/20/2009  . 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 OLIGOMENORRHEA 05/03/2008  . Anxiety state 09/28/2007  . DISORDER, DENTAL NOS 09/28/2007    Past Surgical History:  Procedure Laterality Date  . BREAST SURGERY    . MOLE REMOVAL    . WISDOM TOOTH EXTRACTION      OB History    No data available     Home Medications    Prior to Admission medications   Medication Sig Start Date End Date Taking? Authorizing Provider  Multiple Vitamins-Minerals (MULTIVITAMIN,TX-MINERALS) tablet Take 1 tablet by mouth daily.      Historical Provider, MD    Family History Family History  Problem Relation Age of Onset  . Coronary artery disease Mother   . Hypertension Mother   . Osteoporosis Mother   . COPD Mother   . Hypertension Father   . Heart attack Father   . Heart disease Father 64  . Glaucoma Son     congenital glaucomea has shunt in good eye one enucleated  . Vitiligo Son     younger  .  Breast cancer      cousins  . Lung cancer      aunts, uncles, cousin  . Cancer Maternal Aunt     Lung    Social History Social History  Substance Use Topics  . Smoking status: Never Smoker  . Smokeless tobacco: Never Used     Comment: Passive smoke exposure   . Alcohol use Yes     Comment: socially     Allergies   Patient has no known allergies.   Review of Systems Review of Systems  Musculoskeletal: Positive for arthralgias (L middle finger). Negative for joint swelling and myalgias.  Skin: Positive for wound.  Allergic/Immunologic: Negative for immunocompromised state.    Neurological: Negative for weakness and numbness.  Hematological: Does not bruise/bleed easily.  Psychiatric/Behavioral: Negative for confusion.  10 systems reviewed and all are negative for acute change except as noted in the HPI.  Physical Exam Updated Vital Signs BP 132/97 (BP Location: Left Arm)   Pulse 63   Temp 98.2 F (36.8 C) (Oral)   Resp 20   Ht 5\' 4"  (1.626 m)   Wt 119 lb 12.8 oz (54.3 kg)   SpO2 99%   BMI 20.56 kg/m   Physical Exam  Constitutional: She is oriented to person, place, and time. Vital signs are normal. She appears well-developed and well-nourished.  Non-toxic appearance. No distress.  Afebrile, nontoxic, NAD  HENT:  Head: Normocephalic and atraumatic.  Mouth/Throat: Mucous membranes are normal.  Eyes: Conjunctivae and EOM are normal. Right eye exhibits no discharge. Left eye exhibits no discharge.  Neck: Normal range of motion. Neck supple.  Cardiovascular: Normal rate and intact distal pulses.   Pulmonary/Chest: Effort normal. No respiratory distress.  Abdominal: Normal appearance. She exhibits no distension.  Musculoskeletal: Normal range of motion.  Left middle finger with FROM intact at MCP, PIP and DIP joints, with small semicircular laceration over the DIP joint, which has already re-adhered itself, no ongoing bleeding, no focal bony TTP, no swelling or crepitus, no deformity, strength and sensation grossly intact, distal pulses intact, compartments soft. SEE PICTURE BELOW.   Neurological: She is alert and oriented to person, place, and time. She has normal strength. No sensory deficit.  Skin: Skin is warm and dry. Laceration noted. No rash noted.  leftr midfdle finger lac as mentined above and pictured below.   Psychiatric: She has a normal mood and affect. Her behavior is normal.  Nursing note and vitals reviewed.    ED Treatments / Results  Labs (all labs ordered are listed, but only abnormal results are displayed) Labs Reviewed - No data  to display  EKG  EKG Interpretation None       Radiology No results found.  Procedures Procedures (including critical care time)  Medications Ordered in ED Medications  Tdap (BOOSTRIX) injection 0.5 mL (not administered)    DIAGNOSTIC STUDIES:  Oxygen Saturation is 99% on RA, normal by my interpretation.    COORDINATION OF CARE:  3:23 PM Discussed treatment plan with pt at bedside and pt agreed to plan.  Initial Impression / Assessment and Plan / ED Course  I have reviewed the triage vital signs and the nursing notes.  Pertinent labs & imaging results that were available during my care of the patient were reviewed by me and considered in my medical decision making (see chart for details).  Clinical Course     55 y.o. female here with superficial finger laceration sustained on a bread knife. NVI with soft  compartments, ROM preserved. No focal bony TTP. Skin flap already readhered itself back down. Doubt need for suture or dermabond, area should heal on its own. Pt declined wanting xray, discussed risks/benefits and pt declined anyway. Will update tetanus and discussed proper wound care. F/up with PCP in 2-3 days for recheck. I explained the diagnosis and have given explicit precautions to return to the ER including for any other new or worsening symptoms. The patient understands and accepts the medical plan as it's been dictated and I have answered their questions. Discharge instructions concerning home care and prescriptions have been given. The patient is STABLE and is discharged to home in good condition.   I personally performed the services described in this documentation, which was scribed in my presence. The recorded information has been reviewed and is accurate.   Final Clinical Impressions(s) / ED Diagnoses   Final diagnoses:  Laceration of left middle finger without foreign body without damage to nail, initial encounter    New Prescriptions New Prescriptions     No medications on file     Allen DerryMercedes Camprubi-Soms, PA-C 12/19/16 1538    Shaune Pollackameron Isaacs, MD 12/21/16 1140

## 2017-01-15 DIAGNOSIS — R5382 Chronic fatigue, unspecified: Secondary | ICD-10-CM | POA: Diagnosis not present

## 2017-01-15 DIAGNOSIS — E7212 Methylenetetrahydrofolate reductase deficiency: Secondary | ICD-10-CM | POA: Diagnosis not present

## 2017-01-15 DIAGNOSIS — E039 Hypothyroidism, unspecified: Secondary | ICD-10-CM | POA: Diagnosis not present

## 2017-01-15 DIAGNOSIS — E559 Vitamin D deficiency, unspecified: Secondary | ICD-10-CM | POA: Diagnosis not present

## 2017-02-01 DIAGNOSIS — R5382 Chronic fatigue, unspecified: Secondary | ICD-10-CM | POA: Diagnosis not present

## 2017-02-01 DIAGNOSIS — E559 Vitamin D deficiency, unspecified: Secondary | ICD-10-CM | POA: Diagnosis not present

## 2017-02-01 DIAGNOSIS — N951 Menopausal and female climacteric states: Secondary | ICD-10-CM | POA: Diagnosis not present

## 2017-03-16 DIAGNOSIS — Z8249 Family history of ischemic heart disease and other diseases of the circulatory system: Secondary | ICD-10-CM | POA: Diagnosis not present

## 2017-03-16 DIAGNOSIS — M818 Other osteoporosis without current pathological fracture: Secondary | ICD-10-CM | POA: Diagnosis not present

## 2017-03-16 DIAGNOSIS — R002 Palpitations: Secondary | ICD-10-CM | POA: Diagnosis not present

## 2017-03-16 DIAGNOSIS — Z119 Encounter for screening for infectious and parasitic diseases, unspecified: Secondary | ICD-10-CM | POA: Diagnosis not present

## 2017-03-25 ENCOUNTER — Other Ambulatory Visit: Payer: Self-pay | Admitting: Internal Medicine

## 2017-03-25 DIAGNOSIS — R002 Palpitations: Secondary | ICD-10-CM

## 2017-03-31 ENCOUNTER — Ambulatory Visit (HOSPITAL_COMMUNITY): Payer: BLUE CROSS/BLUE SHIELD | Attending: Internal Medicine

## 2017-03-31 ENCOUNTER — Other Ambulatory Visit: Payer: Self-pay

## 2017-03-31 DIAGNOSIS — I071 Rheumatic tricuspid insufficiency: Secondary | ICD-10-CM | POA: Insufficient documentation

## 2017-03-31 DIAGNOSIS — I340 Nonrheumatic mitral (valve) insufficiency: Secondary | ICD-10-CM | POA: Diagnosis not present

## 2017-03-31 DIAGNOSIS — R002 Palpitations: Secondary | ICD-10-CM | POA: Diagnosis not present

## 2017-04-07 DIAGNOSIS — M818 Other osteoporosis without current pathological fracture: Secondary | ICD-10-CM | POA: Diagnosis not present

## 2017-04-07 DIAGNOSIS — Z6379 Other stressful life events affecting family and household: Secondary | ICD-10-CM | POA: Diagnosis not present

## 2017-04-07 DIAGNOSIS — R002 Palpitations: Secondary | ICD-10-CM | POA: Diagnosis not present

## 2017-04-07 DIAGNOSIS — Z8249 Family history of ischemic heart disease and other diseases of the circulatory system: Secondary | ICD-10-CM | POA: Diagnosis not present

## 2017-04-09 DIAGNOSIS — Z1389 Encounter for screening for other disorder: Secondary | ICD-10-CM | POA: Diagnosis not present

## 2017-04-09 DIAGNOSIS — Z124 Encounter for screening for malignant neoplasm of cervix: Secondary | ICD-10-CM | POA: Diagnosis not present

## 2017-04-09 DIAGNOSIS — Z6820 Body mass index (BMI) 20.0-20.9, adult: Secondary | ICD-10-CM | POA: Diagnosis not present

## 2017-04-09 DIAGNOSIS — Z01419 Encounter for gynecological examination (general) (routine) without abnormal findings: Secondary | ICD-10-CM | POA: Diagnosis not present

## 2017-04-09 DIAGNOSIS — Z130 Encounter for screening for diseases of the blood and blood-forming organs and certain disorders involving the immune mechanism: Secondary | ICD-10-CM | POA: Diagnosis not present

## 2017-04-09 DIAGNOSIS — N952 Postmenopausal atrophic vaginitis: Secondary | ICD-10-CM | POA: Diagnosis not present

## 2017-04-09 DIAGNOSIS — Z1151 Encounter for screening for human papillomavirus (HPV): Secondary | ICD-10-CM | POA: Diagnosis not present

## 2017-06-16 ENCOUNTER — Ambulatory Visit: Payer: Self-pay | Admitting: Rheumatology

## 2017-06-23 ENCOUNTER — Telehealth: Payer: Self-pay | Admitting: Internal Medicine

## 2017-06-23 NOTE — Telephone Encounter (Signed)
Ok  But  Get copy of dr Derinda Sisschoenoffs  Last year of notes.

## 2017-06-23 NOTE — Telephone Encounter (Signed)
Pt had gone to Dr Raechel Chuteeborah Schoenhoff for a second opinion.  They switched pt's pcp to her. Pt not seen you since 07/2015, but would like to know if OK to come back and be your pt? Pt wants a cpe. Please advise. Thank you.

## 2017-07-14 ENCOUNTER — Ambulatory Visit: Payer: Self-pay | Admitting: Rheumatology

## 2017-07-26 NOTE — Progress Notes (Signed)
Chief Complaint  Patient presents with  . Annual Exam    HPI: Patient  Christine Ford  56 y.o. comes in today for Preventive Health Care visit and to reestablish     Last visit 2016 Saw dr Arlean Hopping for women clinic assessment   In march  Also List of concerns addressed.  Neck  has bone spurs problematic at times   Plan to go back and see stern  Working once a day  but  Could do  ...    3 x per week options.    ongoing  Neck   Pain issues  As dental hygienist   Gets  Pinky ulnar   r hand tingling?   hand .   Working once a week mondays  .   Asks about osteoporosis    Had dexa  -2.6 in hip  Taking  Vit d 5000 had level 63 in march  Weight bearing has family hx  Mom  . ? When should next dexa happen.   ? About getting pet scan of brain  Because of concern about  fam hx alzhiemer mom age 67  Began a few years ago and   ? Risk    Placed on hrt [place]  ? No hot flushes   Was told to help for osteoporosis   Sleep is much better   Since dental  Issues better .   Has copy of labs from march and all normal fbs 85cr l0.78tsh 1.79 vit d 62hep c neg  Tc 171 ratio 2.3 ldl 83 hdl 74 Had echo cardiogram normal also  To be scanned in to record  Health Maintenance  Topic Date Due  . MAMMOGRAM  01/25/2017  . INFLUENZA VACCINE  07/28/2017  . HIV Screening  07/28/2018 (Originally 07/31/1976)  . PAP SMEAR  01/25/2018  . COLONOSCOPY  04/11/2024  . TETANUS/TDAP  12/19/2026  . Hepatitis C Screening  Completed   Health Maintenance Review LIFESTYLE:  Exercise:    3x per week body pump and cardio .  Alcohol:  Couple  A month  Sugar beverages: no Sleep:   Good  6-7 hours    Drug use: no HH of   6  Mom  Work: Due colon screening  2015   .   5 years  Problem with   Prep  Last period age 89   ROS:  GEN/ HEENT: No fever, significant weight changes sweats headaches vision problems hearing changes, CV/ PULM; No chest pain shortness of breath cough, syncope,edema  change in exercise tolerance. GI  /GU: No adominal pain, vomiting, change in bowel habits. No blood in the stool. No significant GU symptoms. SKIN/HEME: ,no acute skin rashes suspicious lesions or bleeding. No lymphadenopathy, nodules, masses.  NEURO/ PSYCH:  No neurologic signs such as weakness numbness.  See above  About neck and right pinky No depression anxiety. IMM/ Allergy: No unusual infections.  Allergy .   REST of 12 system review negative except as per HPI   Past Medical History:  Diagnosis Date  . Arthritis   . H/O echocardiogram 03/2017   nl  . Normal nuclear stress test    nl stress echo [name] 2002  . Osteopenia   . Temporomandibular joint disorder (TMJ)   . 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 BREAST SURGERY    .  MOLE REMOVAL    . WISDOM TOOTH EXTRACTION      Family History  Problem Relation Age of Onset  . Coronary artery disease Mother   . Hypertension Mother   . Osteoporosis Mother   . COPD Mother   . Hypertension Father   . Heart attack Father   . Heart disease Father 34  . Glaucoma Son        congenital glaucomea has shunt in good eye one enucleated  . Vitiligo Son        younger  . Breast cancer Unknown        cousins  . Lung cancer Unknown        aunts, uncles, cousin  . Cancer Maternal Aunt        Lung    Social History   Social History  . Marital status: Married    Spouse name: N/A  . Number of children: 3  . Years of education: N/A   Occupational History  . 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 Smoking status: Never Smoker  . Smokeless tobacco: Never Used     Comment: Passive smoke exposure   . Alcohol use Yes     Comment: socially  . Drug use: No  . Sexual activity: Not Asked   Other Topics Concern  . 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 Multiple Vitamins-Minerals (MULTIVITAMIN,TX-MINERALS) tablet Take 1 tablet by mouth daily.       No facility-administered medications prior to visit.      EXAM:  BP 102/70 (BP Location: Right Arm, Patient Position: Sitting, Cuff Size: Normal)   Pulse 60   Temp 98.2 F (36.8 C) (Oral)   Ht 5' 4.5" (1.638 m)   Wt 122 lb (55.3 kg)   BMI 20.62 kg/m   Body mass index is 20.62 kg/m. Wt Readings from Last 3 Encounters:  07/28/17 122 lb (55.3 kg)  12/19/16 119 lb 12.8 oz (54.3 kg)  07/29/15 117 lb 4.8 oz (53.2 kg)    Physical Exam: Vital signs reviewed QAS:TMHD is a well-developed well-nourished alert cooperative    who appearsr stated age in no acute distress.  HEENT: normocephalic atraumatic , Eyes: PERRL EOM's full, conjunctiva clear, Nares: paten,t no deformity discharge or tenderness., Ears: no deformity EAC's clear TMs with normal landmarks. Mouth: clear OP, no lesions, edema.  Moist mucous membranes. Dentition in adequate repair. NECK: supple without masses, thyromegaly or bruits. CHEST/PULM:  Clear to auscultation and percussion breath sounds equal no wheeze , rales or rhonchi. No chest wall deformities or tenderness. Breast: normal by inspection . No dimpling, discharge, masses, tenderness or discharge . CV: PMI is nondisplaced, S1 S2 no gallops, murmurs, rubs. Peripheral pulses are full without delay.No JVD .  ABDOMEN: Bowel sounds normal nontender  No guard or rebound, no hepato splenomegal no CVA tenderness.   Extremtities:  No clubbing cyanosis or edema, no acute joint swelling  or redness no focal atrophy NEURO:  Oriented x3, cranial nerves 3-12 appear to be intact, no obvious focal weakness,gait  within normal limits no abnormal reflexes or asymmetrical SKIN: No acute rashes normal turgor, color, no bruising or petechiae. PSYCH: Oriented, good eye contact, no obvious depression anxiety, cognition and judgment appear normal. LN: no cervical axillary inguinal adenopathy  Lab Results  Component Value Date   WBC 4.3 10/10/2014   HGB 13.7 10/10/2014   HCT 41.1 10/10/2014   PLT 224.0 10/10/2014   GLUCOSE 64 (L) 10/10/2014   CHOL 188 10/10/2014   TRIG 79.0 10/10/2014   HDL 57.40 10/10/2014   LDLCALC 115 (H) 10/10/2014   ALT 14 10/10/2014   AST 25 10/10/2014   NA 138 10/10/2014   K 4.0 10/10/2014   CL 105 10/10/2014   CREATININE 0.8 10/10/2014   BUN 16 10/10/2014   CO2 28 10/10/2014   TSH 1.98 07/29/2015    BP Readings from Last 3 Encounters:  07/28/17 102/70  12/19/16 132/97  07/29/15 140/80    Lab results reviewed with patient   ASSESSMENT AND PLAN:  Discussed the following assessment and plan:  Visit for preventive health examination  Osteoporosis without current pathological fracture, unspecified osteoporosis type  Family history of heart disease  Family history of dementia  Neck pain - on going some radiation effects  occupation has seen dr Vertell Limber in remote past not surgical  - Plan: Ambulatory referral to Sports Medicine Lab results reviewed  And very favorable so no need to repeat.   Advise fu dexa 2019  Consider meds if appropriate .   Expectant management.   Reviewed concerns about family risk of premature heart disease Alzheimer's in her 2 year old mom and osteoporosis risk. Also in regard to her neck pain which sounds a nonsurgical but is affecting her work situation discussed referral to see Dr. Tamala Julian as an option. She would like to proceed. We also discussed hormone replacement therapy indications handout printed from up-to-date with [place] it is not recommended to use hormone replacement therapy for osteoporosis without other indications. She  apparently does not have and she does have a family history of breast cancer although not first degree. At this time she will try going off and see if it affects her sleep or other clinical symptoms.   Patient Care Team: Lanice Shirts, MD as PCP - General (Internal Medicine) Elsie Stain, MD (Pulmonary Disease) Paula Compton, MD (Obstetrics and Gynecology) Patient Instructions  Continue  Vit d   No more than 5000 iu per day .   Continue weight bearing exercise  Can see dr Vertell Limber about neck and  But do a referral to dr Tamala Julian  Estrogen are not recommended  .     For  Osteoporosis .   Treatment unless   Prevention unless other compelling reason cause of risk .   Get bone densitiy in 2019   For tracking .   Check into shingles vaccine. Shingrix  Can receive either in the office or pharmacy depending on insurance rules.   Brain health  Get sleep  Avoid  Insults to brain  Trauma and drugs .  Aerobic exrecise  Mediterranean eating  .  Heart healthy life  Is the only think associated with less cognitive decline. So far .     Preventive Care 40-64 Years, Female Preventive care refers to lifestyle choices and visits with your health care provider that can promote health and wellness. What does preventive care include?  A yearly physical exam. This is also called an annual well check.  Dental exams once or twice a year.  Routine eye exams. Ask your health care provider how often you should have your eyes checked.  Personal lifestyle choices, including: ? Daily care of your teeth and gums. ? Regular physical activity. ? Eating a healthy diet. ? Avoiding tobacco and drug use. ? Limiting alcohol use. ? Practicing safe sex. ? Taking low-dose aspirin daily starting at age 36. ? Taking vitamin and mineral supplements as recommended by your health care provider. What happens during an annual well check? The services and screenings done by your health care provider during your  annual well check will depend on your age, overall health, lifestyle risk factors, and family history of disease. Counseling Your health care provider may ask you questions about your:  Alcohol use.  Tobacco use.  Drug use.  Emotional well-being.  Home and relationship well-being.  Sexual activity.  Eating habits.  Work and work Statistician.  Method of birth control.  Menstrual cycle.  Pregnancy history.  Screening You may have the following tests or measurements:  Height, weight, and BMI.  Blood pressure.  Lipid and cholesterol levels. These may be checked every 5 years, or more frequently if you are over 76 years old.  Skin check.  Lung cancer screening. You may have this screening every year starting at age 65 if you have a 30-pack-year history of smoking and currently smoke or have quit within the past 15 years.  Fecal occult blood test (FOBT) of the stool. You may have this test every year starting at age 66.  Flexible sigmoidoscopy or colonoscopy. You may have a sigmoidoscopy every 5 years or a colonoscopy every 10 years starting at age 66.  Hepatitis C blood test.  Hepatitis B blood test.  Sexually transmitted disease (STD) testing.  Diabetes screening. This is done by checking your blood sugar (glucose) after you have not eaten for a while (fasting). You may have this done every 1-3 years.  Mammogram. This may be done every 1-2 years. Talk to your health care provider about when you should start having regular mammograms. This may depend on whether you have a family history of breast cancer.  BRCA-related cancer screening. This may be done if you have a family history of breast, ovarian, tubal, or peritoneal cancers.  Pelvic exam and Pap test. This may be done every 3 years starting at age 51. Starting at age 87, this may be done every 5 years if you have a Pap test in combination with an HPV test.  Bone density scan. This is done to screen for  osteoporosis. You may have this scan if you are at high risk for osteoporosis.  Discuss your test results, treatment options, and if necessary, the need for more tests with your health care provider. Vaccines Your health care provider may recommend certain vaccines, such as:  Influenza vaccine. This is recommended every year.  Tetanus, diphtheria, and acellular pertussis (Tdap, Td) vaccine. You may need a Td booster every 10 years.  Varicella vaccine. You may need this if you have not been vaccinated.  Zoster vaccine. You may need this after age 42.  Measles, mumps, and rubella (MMR) vaccine. You may need at least one dose of MMR if you were born in 1957 or later. You may also need a second dose.  Pneumococcal 13-valent conjugate (PCV13) vaccine. You may need this if you have certain conditions and were not previously  vaccinated.  Pneumococcal polysaccharide (PPSV23) vaccine. You may need one or two doses if you smoke cigarettes or if you have certain conditions.  Meningococcal vaccine. You may need this if you have certain conditions.  Hepatitis A vaccine. You may need this if you have certain conditions or if you travel or work in places where you may be exposed to hepatitis A.  Hepatitis B vaccine. You may need this if you have certain conditions or if you travel or work in places where you may be exposed to hepatitis B.  Haemophilus influenzae type b (Hib) vaccine. You may need this if you have certain conditions.  Talk to your health care provider about which screenings and vaccines you need and how often you need them. This information is not intended to replace advice given to you by your health care provider. Make sure you discuss any questions you have with your health care provider. Document Released: 01/10/2016 Document Revised: 09/02/2016 Document Reviewed: 10/15/2015 Elsevier Interactive Patient Education  2017 [place]. Demarie Hyneman  M.D.

## 2017-07-28 ENCOUNTER — Encounter: Payer: Self-pay | Admitting: Internal Medicine

## 2017-07-28 ENCOUNTER — Ambulatory Visit (INDEPENDENT_AMBULATORY_CARE_PROVIDER_SITE_OTHER): Payer: BLUE CROSS/BLUE SHIELD | Admitting: Internal Medicine

## 2017-07-28 VITALS — BP 102/70 | HR 60 | Temp 98.2°F | Ht 64.5 in | Wt 122.0 lb

## 2017-07-28 DIAGNOSIS — M810 Age-related osteoporosis without current pathological fracture: Secondary | ICD-10-CM | POA: Insufficient documentation

## 2017-07-28 DIAGNOSIS — Z818 Family history of other mental and behavioral disorders: Secondary | ICD-10-CM

## 2017-07-28 DIAGNOSIS — M542 Cervicalgia: Secondary | ICD-10-CM

## 2017-07-28 DIAGNOSIS — Z0001 Encounter for general adult medical examination with abnormal findings: Secondary | ICD-10-CM | POA: Diagnosis not present

## 2017-07-28 DIAGNOSIS — Z8249 Family history of ischemic heart disease and other diseases of the circulatory system: Secondary | ICD-10-CM

## 2017-07-28 DIAGNOSIS — Z0000 Encounter for general adult medical examination without abnormal findings: Secondary | ICD-10-CM

## 2017-07-28 HISTORY — DX: Family history of other mental and behavioral disorders: Z81.8

## 2017-07-28 NOTE — Patient Instructions (Addendum)
Continue  Vit d   No more than 5000 iu per day .   Continue weight bearing exercise  Can see dr Vertell Limber about neck and  But do a referral to dr Tamala Julian  Estrogen are not recommended  .     For  Osteoporosis .   Treatment unless   Prevention unless other compelling reason cause of risk .   Get bone densitiy in 2019   For tracking .   Check into shingles vaccine. Shingrix  Can receive either in the office or pharmacy depending on insurance rules.   Brain health  Get sleep  Avoid  Insults to brain  Trauma and drugs .  Aerobic exrecise  Mediterranean eating  .  Heart healthy life  Is the only think associated with less cognitive decline. So far .     Preventive Care 40-64 Years, Female Preventive care refers to lifestyle choices and visits with your health care provider that can promote health and wellness. What does preventive care include?  A yearly physical exam. This is also called an annual well check.  Dental exams once or twice a year.  Routine eye exams. Ask your health care provider how often you should have your eyes checked.  Personal lifestyle choices, including: ? Daily care of your teeth and gums. ? Regular physical activity. ? Eating a healthy diet. ? Avoiding tobacco and drug use. ? Limiting alcohol use. ? Practicing safe sex. ? Taking low-dose aspirin daily starting at age 61. ? Taking vitamin and mineral supplements as recommended by your health care provider. What happens during an annual well check? The services and screenings done by your health care provider during your annual well check will depend on your age, overall health, lifestyle risk factors, and family history of disease. Counseling Your health care provider may ask you questions about your:  Alcohol use.  Tobacco use.  Drug use.  Emotional well-being.  Home and relationship well-being.  Sexual activity.  Eating habits.  Work and work Statistician.  Method of birth control.  Menstrual  cycle.  Pregnancy history.  Screening You may have the following tests or measurements:  Height, weight, and BMI.  Blood pressure.  Lipid and cholesterol levels. These may be checked every 5 years, or more frequently if you are over 63 years old.  Skin check.  Lung cancer screening. You may have this screening every year starting at age 71 if you have a 30-pack-year history of smoking and currently smoke or have quit within the past 15 years.  Fecal occult blood test (FOBT) of the stool. You may have this test every year starting at age 4.  Flexible sigmoidoscopy or colonoscopy. You may have a sigmoidoscopy every 5 years or a colonoscopy every 10 years starting at age 18.  Hepatitis C blood test.  Hepatitis B blood test.  Sexually transmitted disease (STD) testing.  Diabetes screening. This is done by checking your blood sugar (glucose) after you have not eaten for a while (fasting). You may have this done every 1-3 years.  Mammogram. This may be done every 1-2 years. Talk to your health care provider about when you should start having regular mammograms. This may depend on whether you have a family history of breast cancer.  BRCA-related cancer screening. This may be done if you have a family history of breast, ovarian, tubal, or peritoneal cancers.  Pelvic exam and Pap test. This may be done every 3 years starting at age 97. Starting at age  30, this may be done every 5 years if you have a Pap test in combination with an HPV test.  Bone density scan. This is done to screen for osteoporosis. You may have this scan if you are at high risk for osteoporosis.  Discuss your test results, treatment options, and if necessary, the need for more tests with your health care provider. Vaccines Your health care provider may recommend certain vaccines, such as:  Influenza vaccine. This is recommended every year.  Tetanus, diphtheria, and acellular pertussis (Tdap, Td) vaccine. You may  need a Td booster every 10 years.  Varicella vaccine. You may need this if you have not been vaccinated.  Zoster vaccine. You may need this after age 75.  Measles, mumps, and rubella (MMR) vaccine. You may need at least one dose of MMR if you were born in 1957 or later. You may also need a second dose.  Pneumococcal 13-valent conjugate (PCV13) vaccine. You may need this if you have certain conditions and were not previously vaccinated.  Pneumococcal polysaccharide (PPSV23) vaccine. You may need one or two doses if you smoke cigarettes or if you have certain conditions.  Meningococcal vaccine. You may need this if you have certain conditions.  Hepatitis A vaccine. You may need this if you have certain conditions or if you travel or work in places where you may be exposed to hepatitis A.  Hepatitis B vaccine. You may need this if you have certain conditions or if you travel or work in places where you may be exposed to hepatitis B.  Haemophilus influenzae type b (Hib) vaccine. You may need this if you have certain conditions.  Talk to your health care provider about which screenings and vaccines you need and how often you need them. This information is not intended to replace advice given to you by your health care provider. Make sure you discuss any questions you have with your health care provider. Document Released: 01/10/2016 Document Revised: 09/02/2016 Document Reviewed: 10/15/2015 Elsevier Interactive Patient Education  2017 [employer].

## 2017-07-28 NOTE — Telephone Encounter (Signed)
Pt is aware, but states there is really nothing to get.  Only saw her 1 x for the 2nd opinion

## 2017-07-29 DIAGNOSIS — Z780 Asymptomatic menopausal state: Secondary | ICD-10-CM | POA: Insufficient documentation

## 2017-07-29 DIAGNOSIS — Z87898 Personal history of other specified conditions: Secondary | ICD-10-CM | POA: Insufficient documentation

## 2017-07-29 NOTE — Progress Notes (Deleted)
Office Visit Note  Patient: Christine Ford             Date of Birth: 03/14/1961           MRN: [id number]             PCP: Kendrick RanchSchoenhoff, Deborah D, MD Referring: Kendrick RanchSchoenhoff, Deborah D, * Visit Date: 08/03/2017 Occupation: @GUAROCC @    Subjective:  No chief complaint on file.   History of Present Illness: Christine Ford is a 56 y.o. female ***   Activities of Daily Living:  Patient reports morning stiffness for *** {minute/hour:19697}.   Patient {ACTIONS;DENIES/REPORTS:21021675::"Denies"} nocturnal pain.  Difficulty dressing/grooming: {ACTIONS;DENIES/REPORTS:21021675::"Denies"} Difficulty climbing stairs: {ACTIONS;DENIES/REPORTS:21021675::"Denies"} Difficulty getting out of chair: {ACTIONS;DENIES/REPORTS:21021675::"Denies"} Difficulty using hands for taps, buttons, cutlery, and/or writing: {ACTIONS;DENIES/REPORTS:21021675::"Denies"}   No Rheumatology ROS completed.   PMFS History:  Patient Active Problem List   Diagnosis Date Noted  . History of gastroesophageal reflux (GERD) 07/29/2017  . Postmenopausal 07/29/2017  . History of palpitations/ Dr Eden EmmsNishan 2002 07/29/2017  . Osteoporosis 07/28/2017  . Family history of heart disease 07/28/2017  . Family history of dementia 07/28/2017  . Osteopenia 10/17/2014  . Family history of breast cancer 06/03/2012  . Visit for preventive health examination 11/23/2011  . Neck pain 04/23/2010  . GERD 09/20/2009    Past Medical History:  Diagnosis Date  . Arthritis   . H/O echocardiogram 03/2017   nl  . Normal nuclear stress test    nl stress echo [name] 2002  . Osteopenia   . Temporomandibular joint disorder (TMJ)   . 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 Coronary artery disease Mother   . Hypertension Mother   . Osteoporosis Mother   . COPD Mother   . Hypertension Father   . Heart attack Father   . Heart  disease Father 259  . Glaucoma Son        congenital glaucomea has shunt in good eye one enucleated  . Vitiligo Son        younger  . Breast cancer Unknown        cousins  . Lung cancer Unknown        aunts, uncles, cousin  . Cancer Maternal Aunt        Lung   Past Surgical History:  Procedure Laterality Date  . BREAST SURGERY    . MOLE REMOVAL    . 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   Physical Exam   Musculoskeletal Exam: ***  CDAI Exam: No CDAI exam completed.    Investigation: Findings:  06/27/2015 DEXA Results: Lumbar spine (L1-L4) Tscore -1.9, Right Femoral neck -2.4,Left Femoral neck -2.6     Imaging: No results found.  Speciality Comments: No specialty comments available.    Procedures:  No procedures  performed Allergies: Patient has no known allergies.   Assessment / Plan:     Visit Diagnoses: Other osteoporosis without current pathological fracture - DEXA  2016 -2.6  left Femoral neck No fracture  Vit D calcium and exercise  Neg celiac panel .   History of gastroesophageal reflux (GERD)  Postmenopausal  History of palpitations/ Dr Eden EmmsNishan 2002    Orders: No orders of the defined types were placed in this encounter.  No orders of the defined types were placed in this encounter.   Face-to-face time spent with patient was *** minutes. 50% of time was spent in counseling and coordination of care.  Follow-Up Instructions: No Follow-up on file.   Amy Littrell, RT  Note - This record has been created using [employer] software.  Chart creation errors have been sought, but may  not always  have been located. Such creation errors do not reflect on  the standard of medical care.

## 2017-07-30 ENCOUNTER — Ambulatory Visit: Payer: Self-pay | Admitting: Rheumatology

## 2017-08-03 ENCOUNTER — Ambulatory Visit: Payer: Self-pay | Admitting: Rheumatology

## 2017-08-12 ENCOUNTER — Ambulatory Visit: Payer: BLUE CROSS/BLUE SHIELD | Admitting: Family Medicine

## 2017-08-17 ENCOUNTER — Ambulatory Visit: Payer: Self-pay | Admitting: Rheumatology

## 2017-08-18 ENCOUNTER — Ambulatory Visit: Payer: BLUE CROSS/BLUE SHIELD | Admitting: Family Medicine

## 2017-09-01 ENCOUNTER — Ambulatory Visit: Payer: Self-pay | Admitting: Rheumatology

## 2017-09-17 ENCOUNTER — Encounter: Payer: Self-pay | Admitting: Internal Medicine

## 2017-09-18 ENCOUNTER — Encounter: Payer: Self-pay | Admitting: Internal Medicine

## 2017-09-24 ENCOUNTER — Ambulatory Visit: Payer: BLUE CROSS/BLUE SHIELD | Admitting: Family Medicine

## 2017-10-02 ENCOUNTER — Encounter: Payer: Self-pay | Admitting: Internal Medicine

## 2017-11-02 ENCOUNTER — Telehealth: Payer: Self-pay | Admitting: Internal Medicine

## 2017-11-02 DIAGNOSIS — M1990 Unspecified osteoarthritis, unspecified site: Secondary | ICD-10-CM

## 2017-11-02 DIAGNOSIS — M542 Cervicalgia: Secondary | ICD-10-CM

## 2017-11-02 DIAGNOSIS — M25552 Pain in left hip: Secondary | ICD-10-CM

## 2017-11-02 NOTE — Telephone Encounter (Signed)
Ok to do this  Referral Dx was neck pain   Ib going  Make sure that is the   Reason  That she is referring to ,

## 2017-11-02 NOTE — Telephone Encounter (Signed)
Please advise Dr Fabian SharpPanosh if okay to change referral to Rheumatologist Dr Corliss Skainseveshwar rather than Sports Med, thanks.

## 2017-11-02 NOTE — Telephone Encounter (Signed)
Pt states she would prefer to see and wants referral to  Dr Corliss Skainseveshwar instead of seeing Dr Katrinka BlazingSmith. Pt states she never made to appt with Dr Katrinka BlazingSmith, prefers to see rheumatology instead.

## 2017-11-03 NOTE — Telephone Encounter (Signed)
Pt aware that the referral has been placed. Pt states that the referral is for her ongoing neck pain but also for arthritis pain and L hip Pain. Pt states that Dr Corliss Skainseveshwar is also wanting to do a Bone Density scan.  Referral placed. Nothing further needed.

## 2017-12-10 DIAGNOSIS — N951 Menopausal and female climacteric states: Secondary | ICD-10-CM | POA: Diagnosis not present

## 2017-12-10 DIAGNOSIS — E559 Vitamin D deficiency, unspecified: Secondary | ICD-10-CM | POA: Diagnosis not present

## 2017-12-10 DIAGNOSIS — R5383 Other fatigue: Secondary | ICD-10-CM | POA: Diagnosis not present

## 2017-12-10 DIAGNOSIS — E039 Hypothyroidism, unspecified: Secondary | ICD-10-CM | POA: Diagnosis not present

## 2018-01-03 DIAGNOSIS — H608X3 Other otitis externa, bilateral: Secondary | ICD-10-CM | POA: Diagnosis not present

## 2018-01-03 DIAGNOSIS — H6123 Impacted cerumen, bilateral: Secondary | ICD-10-CM | POA: Diagnosis not present

## 2018-01-24 DIAGNOSIS — E559 Vitamin D deficiency, unspecified: Secondary | ICD-10-CM | POA: Diagnosis not present

## 2018-01-24 DIAGNOSIS — R5382 Chronic fatigue, unspecified: Secondary | ICD-10-CM | POA: Diagnosis not present

## 2018-01-24 DIAGNOSIS — N951 Menopausal and female climacteric states: Secondary | ICD-10-CM | POA: Diagnosis not present

## 2018-01-24 DIAGNOSIS — E039 Hypothyroidism, unspecified: Secondary | ICD-10-CM | POA: Diagnosis not present

## 2018-03-21 NOTE — Progress Notes (Signed)
Office Visit Note  Patient: Christine Ford             Date of Birth: 1961-10-05           MRN: [id number]             PCP: Madelin Headings, MD Referring: Madelin Headings, MD Visit Date: 04/04/2018 Occupation: Dental hygienist    Subjective:  Pain in multiple joints and osteoporosis   History of Present Illness: Christine Ford is a 57 y.o. female seen in consultation per request of her PCP.  According to patient in 2010 she started having neck pain, hand pain and shoulder discomfort.  She was also having some discomfort in her left hip.  Patient recalls that she was evaluated by me.  She had labs which were negative for autoimmune disease.  She also had x-ray of the C-spine which indicated degenerative disc disease.  She was referred to physical therapy for neck pain.  She was also prescribed a TENS unit.  And was suggested to use natural anti-inflammatories.  She states over time time the symptoms improve.  Currently she is having discomfort in her C-spine and she has difficulty with lateral rotation on the left.  She also has some discomfort in her left shoulder joint with popping sensation and her left hip with popping sensation.  She does not have much discomfort in her hands currently.  She has been exercising on regular basis for the last few years.  She states she had intentional weight loss in February 2018 due to exercise and watching her diet which helped with her lower back and hip discomfort.  She was diagnosed with osteopenia in 2012 and in 2016 on the bone density her BMD decreased and she was in the osteoporosis range.  She states she has been taking, vitamin D 5000 units a day and magnesium 1300 mg a day.  She is not taking any calcium.  She gets some calcium from dietary sources.  She has not taken any treatment for osteoporosis so far.  Been doing weightbearing exercises and also cardio at the gym on regular basis. Patient later reported that she has been having weakness in her  right fourth and fifth digit and decreased grip strength because of that.    Activities of Daily Living:  Patient reports morning stiffness for 2 minutes.   Patient Denies nocturnal pain.  Difficulty dressing/grooming: Denies Difficulty climbing stairs: Denies Difficulty getting out of chair: Denies Difficulty using hands for taps, buttons, cutlery, and/or writing: Denies   Review of Systems  Constitutional: Positive for fatigue. Negative for night sweats, weight gain and weight loss.  HENT: Negative for mouth sores, trouble swallowing, trouble swallowing, mouth dryness and nose dryness.   Eyes: Positive for dryness. Negative for pain, redness and visual disturbance.  Respiratory: Negative for cough, shortness of breath and difficulty breathing.   Cardiovascular: Negative for chest pain, palpitations, hypertension, irregular heartbeat and swelling in legs/feet.  Gastrointestinal: Negative for blood in stool, constipation and diarrhea.  Endocrine: Negative for increased urination.  Genitourinary: Negative for vaginal dryness.  Musculoskeletal: Positive for arthralgias and joint pain. Negative for joint swelling, myalgias, muscle weakness, morning stiffness, muscle tenderness and myalgias.  Skin: Negative for color change, rash, hair loss, skin tightness, ulcers and sensitivity to sunlight.  Allergic/Immunologic: Negative for susceptible to infections.  Neurological: Negative for dizziness, memory loss, night sweats and weakness.  Hematological: Negative for swollen glands.  Psychiatric/Behavioral: Negative for depressed mood and sleep disturbance. The  patient is nervous/anxious.     PMFS History:  Patient Active Problem List   Diagnosis Date Noted  . Postmenopausal 07/29/2017  . History of palpitations/ Dr Eden Emms 2002 07/29/2017  . Osteoporosis 07/28/2017  . Family history of heart disease 07/28/2017  . Family history of dementia 07/28/2017  . Osteopenia 10/17/2014  . Family  history of breast cancer 06/03/2012  . Visit for preventive health examination 11/23/2011  . Neck pain 04/23/2010    Past Medical History:  Diagnosis Date  . Arthritis   . H/O echocardiogram 03/2017   nl  . Normal nuclear stress test    nl stress echo [name] 2002  . Osteopenia   . Temporomandibular joint disorder (TMJ)   . 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 Coronary artery disease Mother   . Hypertension Mother   . Osteoporosis Mother   . COPD Mother   . Hypertension Father   . Heart attack Father   . Heart disease Father 18  . Glaucoma Son        congenital glaucomea has shunt in good eye one enucleated  . Breast cancer Unknown        cousins  . Lung cancer Unknown        aunts, uncles, cousin  . Cancer Maternal Aunt        Lung  . Heart disease Sister    Past Surgical History:  Procedure Laterality Date  . BREAST SURGERY    . MOLE REMOVAL    . 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 She appears well-developed and well-nourished.  HENT:  Head: Normocephalic and atraumatic.  Eyes: Conjunctivae and EOM are normal.  Neck: Normal range of motion.  Cardiovascular: Normal rate, regular rhythm, normal heart sounds and intact distal pulses.  Pulmonary/Chest: Effort normal and breath  sounds normal.  Abdominal: Soft. Bowel sounds are normal.  Lymphadenopathy:    She has no cervical adenopathy.  Neurological: She is alert and oriented to person, place, and time.  Skin: Skin is warm and dry. Capillary refill takes less than 2 seconds.  Psychiatric: She has a normal mood and affect. Her behavior is normal.  Nursing note and vitals reviewed.    Musculoskeletal Exam: She has discomfort and limitation of lateral rotation of the C-spine.  Thoracic and lumbar spine were in good range of motion.  She has some discomfort range of motion of her lumbar spine.  No SI joint tenderness was noted.  She has discomfort with range of motion  of her left shoulder joint with crepitus.  Although the range of motion was complete.  Right shoulder joint, bilateral elbows, bilateral wrist joints are in good range of motion.  She had bilateral PIP thickening.  Hip joints, knee joints, ankles and MTPs were in good range of motion with no synovitis.  CDAI Exam: No CDAI exam completed.    Investigation: Findings:  July 01, 2015 DEXA T score -2.6 left femoral neck with a drop of -14.5%, T score -2.4 right femoral neck with a drop of -14.5%, lumbar spine -1.9 with a drop of minus 15%  CBC Latest Ref Rng & Units 10/10/2014 03/29/2013 10/23/2011  WBC 4.0 - 10.5 K/uL 4.3 4.7 4.8  Hemoglobin 12.0 - 15.0 g/dL 13.0 86.5 78.4  Hematocrit 36.0 - 46.0 % 41.1 38.8 40.9  Platelets 150.0 - 400.0 K/uL 224.0 215.0 202.0   CMP Latest Ref Rng & Units 07/29/2015 10/10/2014 03/29/2013  Glucose 70 - 99 mg/dL - 69(G) 85  BUN 6 - 23 mg/dL - 16 29(B)  Creatinine 0.4 - 1.2 mg/dL - 0.8 0.7  Sodium 284 - 145 mEq/L - 138 139  Potassium 3.5 - 5.1 mEq/L - 4.0 3.8  Chloride 96 - 112 mEq/L - 105 104  CO2 19 - 32 mEq/L - 28 30  Calcium 8.4 - 10.5 mg/dL 9.3 9.2 8.9  Total Protein 6.0 - 8.3 g/dL - 7.6 7.1  Total Bilirubin 0.2 - 1.2 mg/dL - 0.8 1.0  Alkaline Phos 39 - 117 U/L - 49 45  AST 0 - 37 U/L - 25 27  ALT 0 - 35 U/L - 14 21      Imaging: Xr Cervical Spine 2 Or 3 Views  Result Date: 04/04/2018 Multilevel spondylosis was noted with C4-5, C5-6, C6-7 narrowing.  Anterior spurring and posterior spurring was noted.  Facet joint arthropathy was noted. Impression: These findings are consistent with multilevel spondylosis and facet joint arthropathy.  Xr Shoulder Left  Result Date: 04/04/2018 No glenohumeral or acromioclavicular joint space narrowing was noted.  No chondrocalcinosis was noted. Impression: Unremarkable x-ray of the left shoulder   Speciality Comments: No specialty comments available.    Procedures:  No procedures performed Allergies: Patient has no known allergies.   Assessment / Plan:     Visit Diagnoses: History of osteoporosis -we had detailed discussion regarding the osteoporosis.  Her bone density from 2016 was reviewed.  She has been taking vitamin D and doing resistive exercises.  Use of calcium and vitamin D was discussed.  We will adjust her dosing based on her vitamin D levels which will be drawn today.  Exercising on regular basis was encouraged.  We also discussed different treatment options which can be offered after the lab results.  Bisphosphonates will be the first choice.  I have given her a handout on Fosamax to review.  Plan: CBC with Differential/Platelet, COMPLETE METABOLIC PANEL WITH GFR, TSH, VITAMIN D 25 Hydroxy (Vit-D Deficiency, Fractures), PTH, intact and calcium, Serum protein electrophoresis with reflex  Polyarthralgia: She complains of some generalized pain in multiple joints including her left TMJ, her C-spine, lumbar spine, her shoulders, bilateral hands and left hip.  No synovitis was noted today.  Pain, neck - Plan: XR Cervical Spine 2 or 3 views.  The x-ray revealed multilevel spondylosis and facet joint arthropathy.  A handout on C-spine exercise was given.  She reports weakness in her right fourth and fifth finger and decreased grip strength.  I will schedule MRI of  her C-spine  to evaluate this further.  Chronic left shoulder pain - Plan: XR Shoulder Left.  X-ray was unremarkable.  Handout on shoulder exercises was given.  A list of natural anti-inflammatories was given.  Pain of left hip joint: Appears to be related to some tendinopathy in her left anterior superior helical spine.  Hip joint was in good range of motion.  History of TMJ disorder    Orders: Orders Placed This Encounter  Procedures  . XR Cervical Spine 2 or 3 views  . XR Shoulder Left  . MR C-SPINE LIMITED WO CONTRAST  . CBC with Differential/Platelet  . COMPLETE METABOLIC PANEL WITH GFR  . TSH  . VITAMIN D 25 Hydroxy (Vit-D Deficiency, Fractures)  . PTH, intact and calcium  . Serum protein electrophoresis with reflex   No orders of the defined types were placed in this encounter.   Face-to-face time spent with patient was 50 minutes.  Greater than 50% of time was spent in counseling and coordination of care.  Follow-Up Instructions: Return for Osteoarthritis, Osteoporosis.   Pollyann Savoy, MD  Note - This record has been created using [employer].  Chart creation errors have been sought, but may not always  have been located. Such creation errors do not reflect on  the standard of medical care.

## 2018-04-04 ENCOUNTER — Ambulatory Visit (INDEPENDENT_AMBULATORY_CARE_PROVIDER_SITE_OTHER): Payer: Self-pay

## 2018-04-04 ENCOUNTER — Encounter: Payer: Self-pay | Admitting: Rheumatology

## 2018-04-04 ENCOUNTER — Ambulatory Visit: Payer: BLUE CROSS/BLUE SHIELD | Admitting: Rheumatology

## 2018-04-04 VITALS — BP 120/77 | HR 66 | Resp 14 | Ht 64.0 in | Wt 114.0 lb

## 2018-04-04 DIAGNOSIS — G8929 Other chronic pain: Secondary | ICD-10-CM

## 2018-04-04 DIAGNOSIS — M25512 Pain in left shoulder: Secondary | ICD-10-CM | POA: Diagnosis not present

## 2018-04-04 DIAGNOSIS — M25552 Pain in left hip: Secondary | ICD-10-CM | POA: Diagnosis not present

## 2018-04-04 DIAGNOSIS — M25511 Pain in right shoulder: Secondary | ICD-10-CM | POA: Diagnosis not present

## 2018-04-04 DIAGNOSIS — M2550 Pain in unspecified joint: Secondary | ICD-10-CM

## 2018-04-04 DIAGNOSIS — Z8739 Personal history of other diseases of the musculoskeletal system and connective tissue: Secondary | ICD-10-CM

## 2018-04-04 DIAGNOSIS — M542 Cervicalgia: Secondary | ICD-10-CM

## 2018-04-04 NOTE — Patient Instructions (Addendum)
Natural anti-inflammatories  You can purchase these at [employer], [employer] or online.  . Turmeric (capsules)  . Ginger (ginger root or capsules)  . Omega 3 (Fish, flax seeds, chia seeds, walnuts, almonds)  . Tart cherry (dried or extract)   Patient should be under the care of a physician while taking these supplements. This may not be reproduced without the permission of Dr. Pollyann Savoy.    Shoulder Exercises Ask your health care provider which exercises are safe for you. Do exercises exactly as told by your health care provider and adjust them as directed. It is normal to feel mild stretching, pulling, tightness, or discomfort as you do these exercises, but you should stop right away if you feel sudden pain or your pain gets worse.Do not begin these exercises until told by your health care provider. RANGE OF MOTION EXERCISES These exercises warm up your muscles and joints and improve the movement and flexibility of your shoulder. These exercises also help to relieve pain, numbness, and tingling. These exercises involve stretching your injured shoulder directly. Exercise A: Pendulum  1. Stand near a wall or a surface that you can hold onto for balance. 2. Bend at the waist and let your left / right arm hang straight down. Use your other arm to support you. Keep your back straight and do not lock your knees. 3. Relax your left / right arm and shoulder muscles, and move your hips and your trunk so your left / right arm swings freely. Your arm should swing because of the motion of your body, not because you are using your arm or shoulder muscles. 4. Keep moving your body so your arm swings in the following directions, as told by your health care provider: ? Side to side. ? Forward and backward. ? In clockwise and counterclockwise circles. 5. Continue each motion for __________ seconds, or for as long as told by your health care provider. 6. Slowly return to the starting  position. Repeat __________ times. Complete this exercise __________ times a day. Exercise B:Flexion, Standing  1. Stand and hold a broomstick, a cane, or a similar object. Place your hands a little more than shoulder-width apart on the object. Your left / right hand should be palm-up, and your other hand should be palm-down. 2. Keep your elbow straight and keep your shoulder muscles relaxed. Push the stick down with your healthy arm to raise your left / right arm in front of your body, and then over your head until you feel a stretch in your shoulder. ? Avoid shrugging your shoulder while you raise your arm. Keep your shoulder blade tucked down toward the middle of your back. 3. Hold for __________ seconds. 4. Slowly return to the starting position. Repeat __________ times. Complete this exercise __________ times a day. Exercise C: Abduction, Standing 1. Stand and hold a broomstick, a cane, or a similar object. Place your hands a little more than shoulder-width apart on the object. Your left / right hand should be palm-up, and your other hand should be palm-down. 2. While keeping your elbow straight and your shoulder muscles relaxed, push the stick across your body toward your left / right side. Raise your left / right arm to the side of your body and then over your head until you feel a stretch in your shoulder. ? Do not raise your arm above shoulder height, unless your health care provider tells you to do that. ? Avoid shrugging your shoulder while you raise your arm.  Keep your shoulder blade tucked down toward the middle of your back. 3. Hold for __________ seconds. 4. Slowly return to the starting position. Repeat __________ times. Complete this exercise __________ times a day. Exercise D:Internal Rotation  1. Place your left / right hand behind your back, palm-up. 2. Use your other hand to dangle an exercise band, a towel, or a similar object over your shoulder. Grasp the band with your  left / right hand so you are holding onto both ends. 3. Gently pull up on the band until you feel a stretch in the front of your left / right shoulder. ? Avoid shrugging your shoulder while you raise your arm. Keep your shoulder blade tucked down toward the middle of your back. 4. Hold for __________ seconds. 5. Release the stretch by letting go of the band and lowering your hands. Repeat __________ times. Complete this exercise __________ times a day. STRETCHING EXERCISES These exercises warm up your muscles and joints and improve the movement and flexibility of your shoulder. These exercises also help to relieve pain, numbness, and tingling. These exercises are done using your healthy shoulder to help stretch the muscles of your injured shoulder. Exercise E: Research officer, political party (External Rotation and Abduction)  1. Stand in a doorway with one of your feet slightly in front of the other. This is called a staggered stance. If you cannot reach your forearms to the door frame, stand facing a corner of a room. 2. Choose one of the following positions as told by your health care provider: ? Place your hands and forearms on the door frame above your head. ? Place your hands and forearms on the door frame at the height of your head. ? Place your hands on the door frame at the height of your elbows. 3. Slowly move your weight onto your front foot until you feel a stretch across your chest and in the front of your shoulders. Keep your head and chest upright and keep your abdominal muscles tight. 4. Hold for __________ seconds. 5. To release the stretch, shift your weight to your back foot. Repeat __________ times. Complete this stretch __________ times a day. Exercise F:Extension, Standing 1. Stand and hold a broomstick, a cane, or a similar object behind your back. ? Your hands should be a little wider than shoulder-width apart. ? Your palms should face away from your back. 2. Keeping your elbows  straight and keeping your shoulder muscles relaxed, move the stick away from your body until you feel a stretch in your shoulder. ? Avoid shrugging your shoulders while you move the stick. Keep your shoulder blade tucked down toward the middle of your back. 3. Hold for __________ seconds. 4. Slowly return to the starting position. Repeat __________ times. Complete this exercise __________ times a day. STRENGTHENING EXERCISES These exercises build strength and endurance in your shoulder. Endurance is the ability to use your muscles for a long time, even after they get tired. Exercise G:External Rotation  1. Sit in a stable chair without armrests. 2. Secure an exercise band at elbow height on your left / right side. 3. Place a soft object, such as a folded towel or a small pillow, between your left / right upper arm and your body to move your elbow a few inches away (about 10 cm) from your side. 4. Hold the end of the band so it is tight and there is no slack. 5. Keeping your elbow pressed against the soft object, move your left /  forearm out, away from your abdomen. Keep your body steady so only your forearm moves. 6. Hold for __________ seconds. 7. Slowly return to the starting position. Repeat __________ times. Complete this exercise __________ times a day. Exercise H:Shoulder Abduction  1. Sit in a stable chair without armrests, or stand. 2. Hold a __________ weight in your left / right hand, or hold an exercise band with both hands. 3. Start with your arms straight down and your left / right palm facing in, toward your body. 4. Slowly lift your left / right hand out to your side. Do not lift your hand above shoulder height unless your health care provider tells you that this is safe. ? Keep your arms straight. ? Avoid shrugging your shoulder while you do this movement. Keep your shoulder blade tucked down toward the middle of your back. 5. Hold for __________ seconds. 6. Slowly  lower your arm, and return to the starting position. Repeat __________ times. Complete this exercise __________ times a day. Exercise I:Shoulder Extension 1. Sit in a stable chair without armrests, or stand. 2. Secure an exercise band to a stable object in front of you where it is at shoulder height. 3. Hold one end of the exercise band in each hand. Your palms should face each other. 4. Straighten your elbows and lift your hands up to shoulder height. 5. Step back, away from the secured end of the exercise band, until the band is tight and there is no slack. 6. Squeeze your shoulder blades together as you pull your hands down to the sides of your thighs. Stop when your hands are straight down by your sides. Do not let your hands go behind your body. 7. Hold for __________ seconds. 8. Slowly return to the starting position. Repeat __________ times. Complete this exercise __________ times a day. Exercise J:Standing Shoulder Row 1. Sit in a stable chair without armrests, or stand. 2. Secure an exercise band to a stable object in front of you so it is at waist height. 3. Hold one end of the exercise band in each hand. Your palms should be in a thumbs-up position. 4. Bend each of your elbows to an "L" shape (about 90 degrees) and keep your upper arms at your sides. 5. Step back until the band is tight and there is no slack. 6. Slowly pull your elbows back behind you. 7. Hold for __________ seconds. 8. Slowly return to the starting position. Repeat __________ times. Complete this exercise __________ times a day. Exercise K:Shoulder Press-Ups  1. Sit in a stable chair that has armrests. Sit upright, with your feet flat on the floor. 2. Put your hands on the armrests so your elbows are bent and your fingers are pointing forward. Your hands should be about even with the sides of your body. 3. Push down on the armrests and use your arms to lift yourself off of the chair. Straighten your elbows  and lift yourself up as much as you comfortably can. ? Move your shoulder blades down, and avoid letting your shoulders move up toward your ears. ? Keep your feet on the ground. As you get stronger, your feet should support less of your body weight as you lift yourself up. 4. Hold for __________ seconds. 5. Slowly lower yourself back into the chair. Repeat __________ times. Complete this exercise __________ times a day. Exercise L: Wall Push-Ups  1. Stand so you are facing a stable wall. Your feet should be about one arm-length away   away from the wall. 2. Lean forward and place your palms on the wall at shoulder height. 3. Keep your feet flat on the floor as you bend your elbows and lean forward toward the wall. 4. Hold for __________ seconds. 5. Straighten your elbows to push yourself back to the starting position. Repeat __________ times. Complete this exercise __________ times a day. This information is not intended to replace advice given to you by your health care provider. Make sure you discuss any questions you have with your health care provider. Document Released: 10/28/2005 Document Revised: 09/07/2016 Document Reviewed: 08/25/2015 Elsevier Interactive Patient Education  2018 [employer] Cervical Strain and Sprain Rehab Ask your health care provider which exercises are safe for you. Do exercises exactly as told by your health care provider and adjust them as directed. It is normal to feel mild stretching, pulling, tightness, or discomfort as you do these exercises, but you should stop right away if you feel sudden pain or your pain gets worse.Do not begin these exercises until told by your health care provider. Stretching and range of motion exercises These exercises warm up your muscles and joints and improve the movement and flexibility of your neck. These exercises also help to relieve pain, numbness, and tingling. Exercise A: Cervical side bend  1. Using good posture, sit on a  stable chair or stand up. 2. Without moving your shoulders, slowly tilt your left / right ear to your shoulder until you feel a stretch in your neck muscles. You should be looking straight ahead. 3. Hold for __________ seconds. 4. Repeat with the other side of your neck. Repeat __________ times. Complete this exercise __________ times a day. Exercise B: Cervical rotation  1. Using good posture, sit on a stable chair or stand up. 2. Slowly turn your head to the side as if you are looking over your left / right shoulder. ? Keep your eyes level with the ground. ? Stop when you feel a stretch along the side and the back of your neck. 3. Hold for __________ seconds. 4. Repeat this by turning to your other side. Repeat __________ times. Complete this exercise __________ times a day. Exercise C: Thoracic extension and pectoral stretch 1. Roll a towel or a small blanket so it is about 4 inches (10 cm) in diameter. 2. Lie down on your back on a firm surface. 3. Put the towel lengthwise, under your spine in the middle of your back. It should not be not under your shoulder blades. The towel should line up with your spine from your middle back to your lower back. 4. Put your hands behind your head and let your elbows fall out to your sides. 5. Hold for __________ seconds. Repeat __________ times. Complete this exercise __________ times a day. Strengthening exercises These exercises build strength and endurance in your neck. Endurance is the ability to use your muscles for a long time, even after your muscles get tired. Exercise D: Upper cervical flexion, isometric 1. Lie on your back with a thin pillow behind your head and a small rolled-up towel under your neck. 2. Gently tuck your chin toward your chest and nod your head down to look toward your feet. Do not lift your head off the pillow. 3. Hold for __________ seconds. 4. Release the tension slowly. Relax your neck muscles completely before you  repeat this exercise. Repeat __________ times. Complete this exercise __________ times a day. Exercise E: Cervical extension, isometric  1. Stand about 6  inches (15 cm) away from a wall, with your back facing the wall. 2. Place a soft object, about 6-8 inches (15-20 cm) in diameter, between the back of your head and the wall. A soft object could be a small pillow, a ball, or a folded towel. 3. Gently tilt your head back and press into the soft object. Keep your jaw and forehead relaxed. 4. Hold for __________ seconds. 5. Release the tension slowly. Relax your neck muscles completely before you repeat this exercise. Repeat __________ times. Complete this exercise __________ times a day. Posture and body mechanics  Body mechanics refers to the movements and positions of your body while you do your daily activities. Posture is part of body mechanics. Good posture and healthy body mechanics can help to relieve stress in your body's tissues and joints. Good posture means that your spine is in its natural S-curve position (your spine is neutral), your shoulders are pulled back slightly, and your head is not tipped forward. The following are general guidelines for applying improved posture and body mechanics to your everyday activities. Standing  When standing, keep your spine neutral and keep your feet about hip-width apart. Keep a slight bend in your knees. Your ears, shoulders, and hips should line up.  When you do a task in which you stand in one place for a long time, place one foot up on a stable object that is 2-4 inches (5-10 cm) high, such as a footstool. This helps keep your spine neutral. Sitting   When sitting, keep your spine neutral and your keep feet flat on the floor. Use a footrest, if necessary, and keep your thighs parallel to the floor. Avoid rounding your shoulders, and avoid tilting your head forward.  When working at a desk or a computer, keep your desk at a height where your  hands are slightly lower than your elbows. Slide your chair under your desk so you are close enough to maintain good posture.  When working at a computer, place your monitor at a height where you are looking straight ahead and you do not have to tilt your head forward or downward to look at the screen. Resting When lying down and resting, avoid positions that are most painful for you. Try to support your neck in a neutral position. You can use a contour pillow or a small rolled-up towel. Your pillow should support your neck but not push on it. This information is not intended to replace advice given to you by your health care provider. Make sure you discuss any questions you have with your health care provider. Document Released: 12/14/2005 Document Revised: 08/20/2016 Document Reviewed: 11/20/2015 Elsevier Interactive Patient Education  2018 [employer] Alendronate tablets What is this medicine? ALENDRONATE (a LEN droe nate) slows calcium loss from bones. It helps to make normal healthy bone and to slow bone loss in people with Paget's disease and osteoporosis. It may be used in others at risk for bone loss. This medicine may be used for other purposes; ask your health care provider or pharmacist if you have questions. COMMON BRAND NAME(S): Fosamax What should I tell my health care provider before I take this medicine? They need to know if you have any of these conditions: -dental disease -esophagus, stomach, or intestine problems, like acid reflux or GERD -kidney disease -low blood calcium -low vitamin D -problems sitting or standing 30 minutes -trouble swallowing -an unusual or allergic reaction to alendronate, other medicines, foods, dyes, or preservatives -pregnant or  trying to get pregnant -breast-feeding How should I use this medicine? You must take this medicine exactly as directed or you will lower the amount of the medicine you absorb into your body or you may cause yourself  harm. Take this medicine by mouth first thing in the morning, after you are up for the day. Do not eat or drink anything before you take your medicine. Swallow the tablet with a full glass (6 to 8 fluid ounces) of plain water. Do not take this medicine with any other drink. Do not chew or crush the tablet. After taking this medicine, do not eat breakfast, drink, or take any medicines or vitamins for at least 30 minutes. Sit or stand up for at least 30 minutes after you take this medicine; do not lie down. Do not take your medicine more often than directed. Talk to your pediatrician regarding the use of this medicine in children. Special care may be needed. Overdosage: If you think you have taken too much of this medicine contact a poison control center or emergency room at once. NOTE: This medicine is only for you. Do not share this medicine with others. What if I miss a dose? If you miss a dose, do not take it later in the day. Continue your normal schedule starting the next morning. Do not take double or extra doses. What may interact with this medicine? -aluminum hydroxide -antacids -aspirin -calcium supplements -drugs for inflammation like ibuprofen, naproxen, and others -iron supplements -magnesium supplements -vitamins with minerals This list may not describe all possible interactions. Give your health care provider a list of all the medicines, herbs, non-prescription drugs, or dietary supplements you use. Also tell them if you smoke, drink alcohol, or use illegal drugs. Some items may interact with your medicine. What should I watch for while using this medicine? Visit your doctor or health care professional for regular checks ups. It may be some time before you see benefit from this medicine. Do not stop taking your medicine except on your doctor's advice. Your doctor or health care professional may order blood tests and other tests to see how you are doing. You should make sure you get  enough calcium and vitamin D while you are taking this medicine, unless your doctor tells you not to. Discuss the foods you eat and the vitamins you take with your health care professional. Some people who take this medicine have severe bone, joint, and/or muscle pain. This medicine may also increase your risk for a broken thigh bone. Tell your doctor right away if you have pain in your upper leg or groin. Tell your doctor if you have any pain that does not go away or that gets worse. This medicine can make you more sensitive to the sun. If you get a rash while taking this medicine, sunlight may cause the rash to get worse. Keep out of the sun. If you cannot avoid being in the sun, wear protective clothing and use sunscreen. Do not use sun lamps or tanning beds/booths. What side effects may I notice from receiving this medicine? Side effects that you should report to your doctor or health care professional as soon as possible: -allergic reactions like skin rash, itching or hives, swelling of the face, lips, or tongue -black or tarry stools -bone, muscle or joint pain -changes in vision -chest pain -heartburn or stomach pain -jaw pain, especially after dental work -pain or trouble when swallowing -redness, blistering, peeling or loosening of the skin, including inside  the mouth Side effects that usually do not require medical attention (report to your doctor or health care professional if they continue or are bothersome): -changes in taste -diarrhea or constipation -eye pain or itching -headache -nausea or vomiting -stomach gas or fullness This list may not describe all possible side effects. Call your doctor for medical advice about side effects. You may report side effects to FDA at 1-800-FDA-1088. Where should I keep my medicine? Keep out of the reach of children. Store at room temperature of 15 and 30 degrees C (59 and 86 degrees F). Throw away any unused medicine after the expiration  date. NOTE: This sheet is a summary. It may not cover all possible information. If you have questions about this medicine, talk to your doctor, pharmacist, or health care provider.  2018 Elsevier/Gold Standard (2011-06-12 08:56:09)

## 2018-04-05 ENCOUNTER — Telehealth (INDEPENDENT_AMBULATORY_CARE_PROVIDER_SITE_OTHER): Payer: Self-pay | Admitting: *Deleted

## 2018-04-05 NOTE — Progress Notes (Signed)
WNLs

## 2018-04-05 NOTE — Telephone Encounter (Signed)
Pt is scheduled at [facility] for MRI Cervical spine on Mon April 15th at 4pm, pt is to arrive at 330pm. Left message on pt vm to return call for appt information

## 2018-04-06 LAB — CBC WITH DIFFERENTIAL/PLATELET
BASOS ABS: 60 {cells}/uL (ref 0–200)
BASOS PCT: 1 %
Eosinophils Absolute: 108 cells/uL (ref 15–500)
Eosinophils Relative: 1.8 %
HEMATOCRIT: 41.6 % (ref 35.0–45.0)
HEMOGLOBIN: 14.2 g/dL (ref 11.7–15.5)
LYMPHS ABS: 1290 {cells}/uL (ref 850–3900)
MCH: 31.9 pg (ref 27.0–33.0)
MCHC: 34.1 g/dL (ref 32.0–36.0)
MCV: 93.5 fL (ref 80.0–100.0)
MONOS PCT: 8.3 %
MPV: 10.9 fL (ref 7.5–12.5)
NEUTROS ABS: 4044 {cells}/uL (ref 1500–7800)
Neutrophils Relative %: 67.4 %
Platelets: 239 10*3/uL (ref 140–400)
RBC: 4.45 10*6/uL (ref 3.80–5.10)
RDW: 12.8 % (ref 11.0–15.0)
Total Lymphocyte: 21.5 %
WBC mixed population: 498 cells/uL (ref 200–950)
WBC: 6 10*3/uL (ref 3.8–10.8)

## 2018-04-06 LAB — PROTEIN ELECTROPHORESIS, SERUM, WITH REFLEX
ALBUMIN ELP: 4.5 g/dL (ref 3.8–4.8)
ALPHA 1: 0.3 g/dL (ref 0.2–0.3)
ALPHA 2: 0.6 g/dL (ref 0.5–0.9)
BETA 2: 0.3 g/dL (ref 0.2–0.5)
Beta Globulin: 0.4 g/dL (ref 0.4–0.6)
Gamma Globulin: 1.1 g/dL (ref 0.8–1.7)
TOTAL PROTEIN: 7.3 g/dL (ref 6.1–8.1)

## 2018-04-06 LAB — COMPLETE METABOLIC PANEL WITH GFR
AG Ratio: 2.1 (calc) (ref 1.0–2.5)
ALBUMIN MSPROF: 4.9 g/dL (ref 3.6–5.1)
ALT: 13 U/L (ref 6–29)
AST: 24 U/L (ref 10–35)
Alkaline phosphatase (APISO): 50 U/L (ref 33–130)
BUN: 24 mg/dL (ref 7–25)
CO2: 31 mmol/L (ref 20–32)
CREATININE: 0.78 mg/dL (ref 0.50–1.05)
Calcium: 9.7 mg/dL (ref 8.6–10.4)
Chloride: 103 mmol/L (ref 98–110)
GFR, EST AFRICAN AMERICAN: 98 mL/min/{1.73_m2} (ref >=60)
GFR, EST NON AFRICAN AMERICAN: 85 mL/min/{1.73_m2} (ref >=60)
GLOBULIN: 2.3 g/dL (ref 1.9–3.7)
GLUCOSE: 87 mg/dL (ref 65–99)
Potassium: 4.2 mmol/L (ref 3.5–5.3)
Sodium: 140 mmol/L (ref 135–146)
TOTAL PROTEIN: 7.2 g/dL (ref 6.1–8.1)
Total Bilirubin: 0.9 mg/dL (ref 0.2–1.2)

## 2018-04-06 LAB — PTH, INTACT AND CALCIUM
Calcium: 9.7 mg/dL (ref 8.6–10.4)
PTH: 50 pg/mL (ref 14–64)

## 2018-04-06 LAB — TSH: TSH: 2.55 mIU/L (ref 0.40–4.50)

## 2018-04-06 LAB — VITAMIN D 25 HYDROXY (VIT D DEFICIENCY, FRACTURES): Vit D, 25-Hydroxy: 47 ng/mL (ref 30–100)

## 2018-04-08 NOTE — Telephone Encounter (Signed)
IC pt this am to confirm her appt with [facility] on Monday and pt is stating she rather go to [facility] to have her MRI done d/t cost with insurance- is a lot cheaper than [facility]. I advised her the SD prefers the hospitals. Is it ok if I send pt to [facility]? Please advise.   [phone]

## 2018-04-08 NOTE — Telephone Encounter (Signed)
Yes. Thank you so much.

## 2018-04-11 ENCOUNTER — Ambulatory Visit (HOSPITAL_COMMUNITY): Payer: BLUE CROSS/BLUE SHIELD

## 2018-04-12 NOTE — Telephone Encounter (Signed)
IC left message on vm with appt information.

## 2018-04-12 NOTE — Telephone Encounter (Signed)
Pt is scheduled with [facility] for tomorrow April 17 at 845am with check in at 815a, left message on pt vm to return my call for appt infomation

## 2018-04-18 ENCOUNTER — Encounter: Payer: Self-pay | Admitting: Rheumatology

## 2018-04-18 DIAGNOSIS — M47812 Spondylosis without myelopathy or radiculopathy, cervical region: Secondary | ICD-10-CM | POA: Diagnosis not present

## 2018-04-18 DIAGNOSIS — M5030 Other cervical disc degeneration, unspecified cervical region: Secondary | ICD-10-CM | POA: Diagnosis not present

## 2018-04-22 ENCOUNTER — Ambulatory Visit: Payer: Self-pay | Admitting: Rheumatology

## 2018-04-25 DIAGNOSIS — R5382 Chronic fatigue, unspecified: Secondary | ICD-10-CM | POA: Diagnosis not present

## 2018-04-25 DIAGNOSIS — M1990 Unspecified osteoarthritis, unspecified site: Secondary | ICD-10-CM | POA: Diagnosis not present

## 2018-04-25 DIAGNOSIS — E559 Vitamin D deficiency, unspecified: Secondary | ICD-10-CM | POA: Diagnosis not present

## 2018-04-25 DIAGNOSIS — N951 Menopausal and female climacteric states: Secondary | ICD-10-CM | POA: Diagnosis not present

## 2018-05-02 ENCOUNTER — Ambulatory Visit: Payer: Self-pay | Admitting: Rheumatology

## 2018-05-06 DIAGNOSIS — M5030 Other cervical disc degeneration, unspecified cervical region: Secondary | ICD-10-CM | POA: Insufficient documentation

## 2018-05-06 DIAGNOSIS — M25512 Pain in left shoulder: Secondary | ICD-10-CM

## 2018-05-06 DIAGNOSIS — G8929 Other chronic pain: Secondary | ICD-10-CM | POA: Insufficient documentation

## 2018-05-06 DIAGNOSIS — Z8739 Personal history of other diseases of the musculoskeletal system and connective tissue: Secondary | ICD-10-CM | POA: Insufficient documentation

## 2018-05-06 NOTE — Progress Notes (Signed)
Office Visit Note  Patient: Christine Ford             Date of Birth: 01-15-1961           MRN: [id number]             PCP: Christine Headings, MD Referring: Christine Headings, MD Visit Date: 05/09/2018 Occupation: Dental hygienist.    Subjective:  Neck pain and osteoporosis.   History of Present Illness: Christine Ford is a 57 y.o. female with history of osteoporosis and DDD of cervical spine.  She had MRI of her cervical spine last month at [facility].  I do not have the results available.  She continues to have numbness in her right hand and some weakness with the grip of her right hand.Her left shoulder is better.  Activities of Daily Living:  Patient reports morning stiffness for 15 minutes.   Patient Reports nocturnal pain.  Difficulty dressing/grooming: Denies Difficulty climbing stairs: Denies Difficulty getting out of chair: Reports Difficulty using hands for taps, buttons, cutlery, and/or writing: Reports   Review of Systems  Constitutional: Positive for fatigue. Negative for night sweats, weight gain and weight loss.  HENT: Negative for mouth sores, trouble swallowing, trouble swallowing, mouth dryness and nose dryness.   Eyes: Negative for pain, redness, visual disturbance and dryness.  Respiratory: Negative for cough, shortness of breath and difficulty breathing.   Cardiovascular: Negative for chest pain, palpitations, hypertension, irregular heartbeat and swelling in legs/feet.  Gastrointestinal: Negative for blood in stool, constipation and diarrhea.  Endocrine: Negative for excessive thirst and increased urination.  Genitourinary: Negative for difficulty urinating and vaginal dryness.  Musculoskeletal: Positive for arthralgias, joint pain and morning stiffness. Negative for joint swelling, myalgias, muscle weakness, muscle tenderness and myalgias.  Skin: Negative for color change, rash, hair loss, skin tightness, ulcers and sensitivity to sunlight.  Allergic/Immunologic:  Negative for susceptible to infections.  Neurological: Negative for dizziness, numbness, memory loss, night sweats and weakness.  Hematological: Negative for bruising/bleeding tendency and swollen glands.  Psychiatric/Behavioral: Negative for depressed mood and sleep disturbance. The patient is not nervous/anxious.     PMFS History:  Patient Active Problem List   Diagnosis Date Noted  . DDD (degenerative disc disease), cervical 05/06/2018  . Chronic left shoulder pain 05/06/2018  . History of TMJ disorder 05/06/2018  . Postmenopausal 07/29/2017  . History of palpitations/ Dr Eden Emms 2002 07/29/2017  . Osteoporosis 07/28/2017  . Family history of heart disease 07/28/2017  . Family history of dementia 07/28/2017  . Family history of breast cancer 06/03/2012  . Visit for preventive health examination 11/23/2011  . Neck pain 04/23/2010    Past Medical History:  Diagnosis Date  . Arthritis   . H/O echocardiogram 03/2017   nl  . Normal nuclear stress test    nl stress echo [name] 2002  . Osteopenia   . Temporomandibular joint disorder (TMJ)   . 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 Coronary artery disease Mother   . Hypertension Mother   . Osteoporosis Mother   . COPD Mother   . Hypertension Father   . Heart attack Father   . Heart disease Father 97  . Glaucoma Son        congenital glaucomea has shunt in good eye one enucleated  . Breast cancer Unknown  cousins  . Lung cancer Unknown        aunts, uncles, cousin  . Cancer Maternal Aunt        Lung  . Heart disease Sister    Past Surgical History:  Procedure Laterality Date  . BREAST SURGERY    . MOLE REMOVAL    . 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 She appears well-developed and well-nourished.  HENT:  Head: Normocephalic and atraumatic.  Eyes: Conjunctivae and EOM are normal.  Neck: Normal range of motion.  Cardiovascular: Normal rate, regular rhythm, normal heart sounds and intact distal pulses.  Pulmonary/Chest: Effort normal and breath sounds normal.  Abdominal: Soft. Bowel sounds are normal.  Lymphadenopathy:    She has no cervical adenopathy.  Neurological: She is alert and oriented to person, place, and time.  Skin: Skin is warm and dry. Capillary refill takes less than 2 seconds.  Psychiatric: She has a normal mood and affect. Her behavior is normal.  Nursing note and vitals reviewed.    Musculoskeletal Exam: .  C-spine limited range of motion with lateral rotation.  Thoracic and lumbar spine good range of motion.  Shoulder joints elbow joints wrist joint MCPs PIPs were in good range of motion. hip joints, knee joints, ankles MTPs PIPs DIPs were in good range of motion with no synovitis.  CDAI Exam: No CDAI exam completed.    Investigation: Findings:  July 01, 2015 DEXA T score -2.6 left femoral neck with a drop of -14.5%, T score -2.4 right femoral neck with a drop of -14.5%, lumbar spine -1.9 with a drop of minus 15%  CBC Latest Ref Rng & Units 04/04/2018 10/10/2014 03/29/2013  WBC 3.8 - 10.8 Thousand/uL 6.0 4.3 4.7  Hemoglobin 11.7 - 15.5 g/dL 16.1 09.6 04.5  Hematocrit 35.0 - 45.0 % 41.6 41.1 38.8  Platelets 140 - 400 Thousand/uL 239 224.0 215.0  CMP normal, SPEP negative, PTH normal, calcium  normal, TSH normal, vitamin D 47  Imaging: No results found.  Speciality Comments: No specialty comments available.    Procedures:  No procedures performed Allergies: Patient has no known allergies.   Assessment / Plan:     Visit Diagnoses: Other osteoporosis without current pathological fracture - Discussed Fosamax during the last visit.she does not want to start Fosamax yet.  I have advised her to get repeat bone density.  She will contact her PCP to schedule the bone density.  We will discuss the result is a follow-up visit.  DDD (degenerative disc disease), cervical-she has been having stiffness and decreased range of motion of her C-spine.  She also has been having numbness in her right hand.  Patient had MRI of her C-spine.  I do not have results available.  Patient will forward results of MRI to [place].  Chronic left shoulder pain-he is doing better after the exercise.   Orders: No orders of the defined types were placed in this encounter.  No orders of the defined types were placed in this encounter.    Follow-Up Instructions: Return for Osteoarthritis, Osteoporosis.   Pollyann Savoy, MD  Note - This record has been created using Animal nutritionist.  Chart creation errors have been sought, but may not always  have been located. Such creation errors do not reflect on  the standard of medical care.

## 2018-05-09 ENCOUNTER — Ambulatory Visit (INDEPENDENT_AMBULATORY_CARE_PROVIDER_SITE_OTHER): Payer: BLUE CROSS/BLUE SHIELD | Admitting: Rheumatology

## 2018-05-09 ENCOUNTER — Encounter: Payer: Self-pay | Admitting: Rheumatology

## 2018-05-09 VITALS — BP 121/74 | HR 63 | Resp 14 | Ht 64.0 in | Wt 114.0 lb

## 2018-05-09 DIAGNOSIS — G8929 Other chronic pain: Secondary | ICD-10-CM | POA: Diagnosis not present

## 2018-05-09 DIAGNOSIS — Z1231 Encounter for screening mammogram for malignant neoplasm of breast: Secondary | ICD-10-CM | POA: Diagnosis not present

## 2018-05-09 DIAGNOSIS — Z01419 Encounter for gynecological examination (general) (routine) without abnormal findings: Secondary | ICD-10-CM | POA: Diagnosis not present

## 2018-05-09 DIAGNOSIS — M5030 Other cervical disc degeneration, unspecified cervical region: Secondary | ICD-10-CM

## 2018-05-09 DIAGNOSIS — Z124 Encounter for screening for malignant neoplasm of cervix: Secondary | ICD-10-CM | POA: Diagnosis not present

## 2018-05-09 DIAGNOSIS — Z1389 Encounter for screening for other disorder: Secondary | ICD-10-CM | POA: Diagnosis not present

## 2018-05-09 DIAGNOSIS — Z681 Body mass index (BMI) 19 or less, adult: Secondary | ICD-10-CM | POA: Diagnosis not present

## 2018-05-09 DIAGNOSIS — M25512 Pain in left shoulder: Secondary | ICD-10-CM

## 2018-05-09 DIAGNOSIS — M818 Other osteoporosis without current pathological fracture: Secondary | ICD-10-CM | POA: Diagnosis not present

## 2018-05-09 DIAGNOSIS — Z130 Encounter for screening for diseases of the blood and blood-forming organs and certain disorders involving the immune mechanism: Secondary | ICD-10-CM | POA: Diagnosis not present

## 2018-07-22 ENCOUNTER — Telehealth: Payer: Self-pay | Admitting: Rheumatology

## 2018-07-22 DIAGNOSIS — M818 Other osteoporosis without current pathological fracture: Secondary | ICD-10-CM

## 2018-07-22 NOTE — Telephone Encounter (Signed)
Patient called stating she is changing her PCP and hasn't picked one yet.  Patient states she does not want to wait for her bone density scan and is requesting Dr. Corliss Skainseveshwar order one for her.

## 2018-07-26 NOTE — Addendum Note (Signed)
Addended by: Henriette CombsHATTON, Revin Corker L on: 07/26/2018 10:55 AM   Modules accepted: Orders

## 2018-07-26 NOTE — Telephone Encounter (Signed)
Order placed for bone density scan. Left message to advise patient.

## 2018-07-26 NOTE — Telephone Encounter (Signed)
Please order DEXA for this patient.

## 2018-08-08 ENCOUNTER — Telehealth: Payer: Self-pay | Admitting: Rheumatology

## 2018-08-08 NOTE — Telephone Encounter (Signed)
Patient called to make sure we received the results from her MRI that she had at [facility]18[facility].  Patient also needs to schedule her bone density scan and wants to know where she should go.

## 2018-08-09 NOTE — Telephone Encounter (Signed)
Advised patient we did receive report from [facility] (scanned under Media tab). Also advised patient the order has been placed for the bone density and they will be calling her to schedule. Patient verbalized understanding.

## 2018-08-12 ENCOUNTER — Ambulatory Visit: Payer: BLUE CROSS/BLUE SHIELD | Admitting: Rheumatology

## 2018-08-12 ENCOUNTER — Telehealth: Payer: Self-pay | Admitting: Family Medicine

## 2018-08-12 NOTE — Telephone Encounter (Signed)
Copied from CRM [phone]. Topic: Appointment Scheduling - Scheduling Inquiry for Clinic >> Aug 12, 2018  3:31 PM Harlan StainsWilliams-Neal, Sade R wrote: Pt is wanting to transfer to [facility] Wolfe [facility]

## 2018-08-14 NOTE — Telephone Encounter (Signed)
ok 

## 2018-08-15 NOTE — Telephone Encounter (Signed)
Do you want to call patient not sure what her schedule template is.

## 2018-08-15 NOTE — Telephone Encounter (Signed)
Please contact patient to schedule

## 2018-08-15 NOTE — Telephone Encounter (Signed)
Dr. Artis FlockWolfe, okay to transfer?

## 2018-08-15 NOTE — Telephone Encounter (Signed)
Sounds good

## 2018-08-15 NOTE — Telephone Encounter (Signed)
Called patient and left msg for patient to call back to schedule appt w/Dr. Artis FlockWolfe.

## 2018-09-05 ENCOUNTER — Telehealth: Payer: Self-pay | Admitting: Rheumatology

## 2018-09-05 NOTE — Telephone Encounter (Signed)
Patient called stating that she no longer is a patient of Dr. Fabian Sharp and has not yet made an appointment with Dr. Artis Flock at [place].  Patient was trying to schedule her Bone Density Scan at [facility] but the only referral they have is from Dr. Corliss Skains and "she is unable to use that referral due to being a conflict of interest."  Patient states she does not currently have a PCP and is planning to call to schedule with Dr. Artis Flock at the end of this week.

## 2018-09-19 NOTE — Progress Notes (Signed)
Office Visit Note  Patient: Christine Ford             Date of Birth: 03-28-1961           MRN: [id number]             PCP: Madelin Headings, MD Referring: Madelin Headings, MD Visit Date: 09/30/2018 Occupation: @GUAROCC @  Subjective:  Neck pain.   History of Present Illness: Christine Ford is a 58 y.o. female with history of osteoporosis, degenerative disease of cervical spine.  She states she has not had her repeat bone density yet.  She will be getting bone density coming month.  She has been having discomfort in her cervical spine with radiculopathy to the left shoulder.  She wanted to discuss MRI result of her C-spine.  She also has some lower back discomfort due to scoliosis.  Activities of Daily Living:  Patient reports morning stiffness for 5 minutes.   Patient Denies nocturnal pain.  Difficulty dressing/grooming: Denies Difficulty climbing stairs: Denies Difficulty getting out of chair: Denies Difficulty using hands for taps, buttons, cutlery, and/or writing: Denies  Review of Systems  Constitutional: Negative for fatigue, night sweats, weight gain and weight loss.  HENT: Positive for mouth dryness. Negative for mouth sores, trouble swallowing, trouble swallowing and nose dryness.   Eyes: Positive for dryness. Negative for pain, redness and visual disturbance.  Respiratory: Negative for cough, shortness of breath and difficulty breathing.   Cardiovascular: Negative for chest pain, palpitations, hypertension, irregular heartbeat and swelling in legs/feet.  Gastrointestinal: Negative for blood in stool, constipation and diarrhea.  Endocrine: Negative for increased urination.  Genitourinary: Negative for vaginal dryness.  Musculoskeletal: Positive for arthralgias, joint pain and morning stiffness. Negative for joint swelling, myalgias, muscle weakness, muscle tenderness and myalgias.  Skin: Negative for color change, rash, hair loss, skin tightness, ulcers and sensitivity to  sunlight.  Allergic/Immunologic: Negative for susceptible to infections.  Neurological: Negative for dizziness, memory loss, night sweats and weakness.  Hematological: Negative for swollen glands.  Psychiatric/Behavioral: Negative for depressed mood and sleep disturbance. The patient is not nervous/anxious.     PMFS History:  Patient Active Problem List   Diagnosis Date Noted  . Other idiopathic scoliosis, lumbar region 09/30/2018  . DDD (degenerative disc disease), cervical 05/06/2018  . Chronic left shoulder pain 05/06/2018  . History of TMJ disorder 05/06/2018  . Postmenopausal 07/29/2017  . History of palpitations/ Dr Eden Emms 2002 07/29/2017  . Osteoporosis 07/28/2017  . Family history of heart disease 07/28/2017  . Family history of dementia 07/28/2017  . Family history of breast cancer 06/03/2012  . Visit for preventive health examination 11/23/2011  . Neck pain 04/23/2010    Past Medical History:  Diagnosis Date  . Arthritis   . H/O echocardiogram 03/2017   nl  . Normal nuclear stress test    nl stress echo [name] 2002  . Osteopenia   . Temporomandibular joint disorder (TMJ)   . 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 Coronary artery disease Mother   . Hypertension Mother   . Osteoporosis Mother   . COPD Mother   . Hypertension Father   . Heart attack Father   . Heart disease Father 3  . Glaucoma Son        congenital glaucomea has shunt in good eye one enucleated  .  Breast cancer Unknown        cousins  . Lung cancer Unknown        aunts, uncles, cousin  . Cancer Maternal Aunt        Lung  . Heart disease Sister    Past Surgical History:  Procedure Laterality Date  . BREAST SURGERY    . MOLE REMOVAL    . 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 She appears well-developed and well-nourished.  HENT:  Head: Normocephalic and atraumatic.  Eyes: Conjunctivae and EOM are normal.  Neck: Normal range of motion.  Cardiovascular: Normal rate, regular rhythm, normal heart sounds and intact distal pulses.  Pulmonary/Chest: Effort normal and breath sounds normal.  Abdominal: Soft. Bowel sounds are normal.  Lymphadenopathy:    She has no cervical adenopathy.  Neurological: She is alert and oriented to person, place, and time.  Skin: Skin is warm and dry. Capillary refill takes less than 2 seconds.  Psychiatric: She has a normal mood and affect. Her behavior is normal.  Nursing note and vitals reviewed.    Musculoskeletal Exam: C-spine limited lateral rotation and limited flexion and extension.  Thoracic and lumbar spine with good range of motion.  Shoulder joints, elbow joints, wrist joints, MCPs and PIPs been good range of motion with no synovitis.  Hip joints, knee joints, ankles and MTPs PIPs were in good range of motion with no synovitis.  CDAI Exam: CDAI Score: Not documented Patient Global Assessment: Not documented; Provider Global Assessment: Not documented Swollen: Not documented; Tender: Not documented Joint Exam   Not documented   There is currently no information documented on the homunculus. Go to the Rheumatology activity and complete the homunculus joint exam.  Investigation: No additional findings.  Imaging: No results found.  Recent  Labs: Lab Results  Component Value Date   WBC 6.0 04/04/2018   HGB 14.2 04/04/2018   PLT 239 04/04/2018   NA 140 04/04/2018   K 4.2 04/04/2018   CL 103 04/04/2018   CO2 31 04/04/2018   GLUCOSE 87 04/04/2018   BUN 24 04/04/2018   CREATININE 0.78 04/04/2018   BILITOT 0.9 04/04/2018   ALKPHOS 49 10/10/2014   AST 24 04/04/2018   ALT 13 04/04/2018   PROT 7.2 04/04/2018   PROT 7.3 04/04/2018   ALBUMIN 3.8 10/10/2014   CALCIUM 9.7 04/04/2018   CALCIUM 9.7 04/04/2018   GFRAA 98 04/04/2018    Speciality Comments: No specialty comments available.  Procedures:  No procedures performed Allergies: Patient has no known allergies.   Assessment / Plan:     Visit Diagnoses: Other osteoporosis without current pathological fracture -patient  will get bone density with her PCP this month.  Last visit we discussed Fosamax and she wanted to hold off until her bone density results.  She will make an appointment after that.  Use of calcium vitamin D and resistive exercises were discussed.  DDD (degenerative disc disease), cervical-we had detailed discussion regarding the MRI of her cervical spine from April 2019.  The findings were discussed at length.  She wants a referral to neurosurgery which we will schedule.  I have also referred her to physical therapy.  Exercise were discussed and demonstrated in the office.  Idiopathic scoliosis of lumbar spine-she has some lower back discomfort.  We will refer her to physical therapy for lower back pain.  Chronic left shoulder pain -she has intermittent pain in her shoulder.  Orders: Orders Placed This Encounter  Procedures  . Ambulatory referral to Physical Therapy  . Ambulatory referral to Neurosurgery   No orders of the defined types were placed in this encounter.   Face-to-face time spent with patient was 30 minutes. Greater than 50% of time was spent in counseling and coordination of care.  Follow-Up Instructions: Return in about 2 months  (around 11/30/2018) for OP, DDD.   Pollyann SavoyShaili Tevis Dunavan, MD  Note - This record has been created using Animal nutritionistDragon software.  Chart creation errors have been sought, but may not always  have been located. Such creation errors do not reflect on  the standard of medical care.

## 2018-09-26 NOTE — Telephone Encounter (Signed)
Pt is calling and would like to transfer to [facility]

## 2018-09-26 NOTE — Telephone Encounter (Signed)
As of right now Dr. Okey Dupre is not taking new patients.  I have left patient a vm that Dr. Artis Flock had agreed to take patient on.  I have also informed her that there are a lot of providers with [facility] currently taking patients she can choose from.  I informed her to call our office line back and that any of our agents would be able to assist her.

## 2018-09-30 ENCOUNTER — Ambulatory Visit: Payer: BLUE CROSS/BLUE SHIELD | Admitting: Rheumatology

## 2018-09-30 ENCOUNTER — Encounter: Payer: Self-pay | Admitting: Rheumatology

## 2018-09-30 VITALS — BP 120/78 | HR 63 | Resp 12 | Ht 64.0 in | Wt 111.6 lb

## 2018-09-30 DIAGNOSIS — M5030 Other cervical disc degeneration, unspecified cervical region: Secondary | ICD-10-CM | POA: Diagnosis not present

## 2018-09-30 DIAGNOSIS — M4126 Other idiopathic scoliosis, lumbar region: Secondary | ICD-10-CM | POA: Diagnosis not present

## 2018-09-30 DIAGNOSIS — M818 Other osteoporosis without current pathological fracture: Secondary | ICD-10-CM

## 2018-09-30 DIAGNOSIS — M25512 Pain in left shoulder: Secondary | ICD-10-CM | POA: Diagnosis not present

## 2018-09-30 DIAGNOSIS — G8929 Other chronic pain: Secondary | ICD-10-CM

## 2018-10-10 ENCOUNTER — Ambulatory Visit (INDEPENDENT_AMBULATORY_CARE_PROVIDER_SITE_OTHER): Payer: BLUE CROSS/BLUE SHIELD | Admitting: Nurse Practitioner

## 2018-10-10 ENCOUNTER — Encounter: Payer: Self-pay | Admitting: Nurse Practitioner

## 2018-10-10 VITALS — BP 120/84 | HR 73 | Ht 64.0 in | Wt 113.0 lb

## 2018-10-10 DIAGNOSIS — M818 Other osteoporosis without current pathological fracture: Secondary | ICD-10-CM

## 2018-10-10 NOTE — Progress Notes (Signed)
Christine Ford is a 57 y.o. female with the following history as recorded in EpicCare:  Patient Active Problem List   Diagnosis Date Noted  . Other idiopathic scoliosis, lumbar region 09/30/2018  . DDD (degenerative disc disease), cervical 05/06/2018  . Chronic left shoulder pain 05/06/2018  . History of TMJ disorder 05/06/2018  . Postmenopausal 07/29/2017  . History of palpitations/ Dr Eden Emms 2002 07/29/2017  . Osteoporosis 07/28/2017  . Family history of heart disease 07/28/2017  . Family history of dementia 07/28/2017  . Family history of breast cancer 06/03/2012  . Visit for preventive health examination 11/23/2011  . Neck pain 04/23/2010    Current Outpatient Medications  Medication Sig Dispense Refill  . Cholecalciferol (VITAMIN D3) 1000 units CAPS     . Multiple Vitamins-Minerals (MULTIVITAMIN,TX-MINERALS) tablet Take 1 tablet by mouth daily.       No current facility-administered medications for this visit.     Allergies: Patient has no known allergies.  Past Medical History:  Diagnosis Date  . Arthritis   . H/O echocardiogram 03/2017   nl  . Normal nuclear stress test    nl stress echo [name] 2002  . Osteopenia   . Temporomandibular joint disorder (TMJ)   . 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 BREAST SURGERY    . MOLE REMOVAL    . WISDOM TOOTH EXTRACTION      Family History  Problem Relation Age of Onset  . Coronary artery disease Mother   . Hypertension Mother   . Osteoporosis Mother   . COPD Mother   . Hypertension Father   . Heart attack Father   . Heart disease Father 55  . Glaucoma Son        congenital glaucomea has shunt in good eye one enucleated  . Breast cancer Unknown        cousins  . Lung cancer Unknown        aunts, uncles, cousin  . Cancer Maternal Aunt        Lung  . Heart disease Sister     Social History    Tobacco Use  . Smoking status: Never Smoker  . Smokeless tobacco: Never Used  . Tobacco comment: Passive smoke exposure   Substance Use Topics  . Alcohol use: Yes    Comment: socially     Subjective:  Christine Ford is here today to establish care, transferring from another [facility] practice due to her prior provider being primarily pediatric focused. Aside from primary care, she is routinely followed by rheumatology for osteoporosis, degenerative disc and gynecology for annual womens health including mammograms and paps. She is currently only maintained on daily OTC supplements, no Rx medications. She reports a long history of osteopenia then osteoporosis, first dx in 2012, has never had a fracture, has not been on Rx medications for this but does try to do regular weight bearing exercise at the gym. She is requesting updated DEXA today, plans to follow up with her rheumatologist again after the DEXA results   Review of Systems  Constitutional: Negative for chills, fever and weight loss.  Respiratory: Negative for shortness of breath.   Cardiovascular: Negative for chest pain.  Gastrointestinal: Negative for blood in stool, constipation and diarrhea.  Genitourinary: Negative for dysuria, frequency and urgency.  Musculoskeletal: Positive for back pain and neck pain. Negative for falls.  Skin: Negative for rash.  Neurological: Negative for loss of consciousness.  Endo/Heme/Allergies: Negative for environmental allergies. Does not bruise/bleed easily.    Objective:  Vitals:   10/10/18 1358 10/10/18 1437  BP: (!) 144/90 120/84  Pulse: 73   SpO2: 99%   Weight: 113 lb (51.3 kg)   Height: 5\' 4"  (1.626 m)     General: Well developed, well nourished, in no acute distress  Skin : Warm and dry.  Head: Normocephalic and atraumatic  Eyes: Sclera and conjunctiva clear; pupils round and reactive to light; extraocular movements intact  Oropharynx: Pink, supple.  Neck: Supple  Lungs:  Respirations unlabored; clear to auscultation bilaterally CVS exam: normal rate, regular rhythm, normal S1, S2 Musculoskeletal: No deformities; no active joint inflammation  Extremities: No edema, cyanosis Vessels: Symmetric bilaterally  Neurologic: Alert and oriented; speech intact; face symmetrical; moves all extremities well; CNII-XII intact without focal deficit   Assessment:  1. Other osteoporosis without current pathological fracture     Plan:   Return in about 3 months (around 01/10/2019) for CPE.  Orders Placed This Encounter  Procedures  . DG Bone Density    Standing Status:   Future    Standing Expiration Date:   12/11/2019    Order Specific Question:   Reason for Exam (SYMPTOM  OR DIAGNOSIS REQUIRED)    Answer:   osteoporosis    Order Specific Question:   Is the patient pregnant?    Answer:   No    Order Specific Question:   Preferred imaging location?    Answer:   [facility]    Requested Prescriptions    No prescriptions requested or ordered in this encounter    04/04/18 CBC w diff, CMET, TSH, vitamin D WNLs-by rheumatology Per 09/30/18 rheumatology visit, update DEXA today F/U with further recommendations pending results Home management, red flags and return precautions including when to seek immediate care discussed and printed on AVS

## 2018-10-10 NOTE — Patient Instructions (Signed)
Please schedule DEXA scan as discussed  It was nice to meet you today!   Osteoporosis Osteoporosis happens when your bones become thinner and weaker. Weak bones can break (fracture) more easily when you slip or fall. Bones most at risk of breaking are in the hip, wrist, and spine. Follow these instructions at home:  Get enough calcium and vitamin D. These nutrients are good for your bones.  Exercise as told by your doctor.  Do not use any tobacco products. This includes cigarettes, chewing tobacco, and electronic cigarettes. If you need help quitting, ask your doctor.  Limit the amount of alcohol you drink.  Take medicines only as told by your doctor.  Keep all follow-up visits as told by your doctor. This is important.  Take care at home to prevent falls. Some ways to do this are: ? Keep rooms well lit and tidy. ? Put safety rails on your stairs. ? Put a rubber mat in the bathroom and other places that are often wet or slippery. Get help right away if:  You fall.  You hurt yourself. This information is not intended to replace advice given to you by your health care provider. Make sure you discuss any questions you have with your health care provider. Document Released: 03/07/2012 Document Revised: 05/21/2016 Document Reviewed: 05/24/2014 Elsevier Interactive Patient Education  [employer].

## 2018-10-29 DIAGNOSIS — M419 Scoliosis, unspecified: Secondary | ICD-10-CM | POA: Diagnosis not present

## 2018-10-29 DIAGNOSIS — M5030 Other cervical disc degeneration, unspecified cervical region: Secondary | ICD-10-CM | POA: Diagnosis not present

## 2018-11-07 ENCOUNTER — Ambulatory Visit (INDEPENDENT_AMBULATORY_CARE_PROVIDER_SITE_OTHER)
Admission: RE | Admit: 2018-11-07 | Discharge: 2018-11-07 | Disposition: A | Discharge status: Home / Self Care | Payer: BLUE CROSS/BLUE SHIELD | Source: Ambulatory Visit | Attending: Nurse Practitioner | Admitting: Nurse Practitioner

## 2018-11-07 ENCOUNTER — Other Ambulatory Visit: Payer: BLUE CROSS/BLUE SHIELD

## 2018-11-07 DIAGNOSIS — M818 Other osteoporosis without current pathological fracture: Secondary | ICD-10-CM

## 2018-11-13 DIAGNOSIS — M818 Other osteoporosis without current pathological fracture: Secondary | ICD-10-CM | POA: Diagnosis not present

## 2018-11-18 ENCOUNTER — Telehealth: Payer: Self-pay | Admitting: Rheumatology

## 2018-11-18 DIAGNOSIS — M5030 Other cervical disc degeneration, unspecified cervical region: Secondary | ICD-10-CM | POA: Diagnosis not present

## 2018-11-18 DIAGNOSIS — F4322 Adjustment disorder with anxiety: Secondary | ICD-10-CM | POA: Diagnosis not present

## 2018-11-18 DIAGNOSIS — M419 Scoliosis, unspecified: Secondary | ICD-10-CM | POA: Diagnosis not present

## 2018-11-18 NOTE — Telephone Encounter (Signed)
Patient left a voicemail stating she was returning your call.   

## 2018-11-21 ENCOUNTER — Ambulatory Visit: Payer: Self-pay

## 2018-11-21 NOTE — Telephone Encounter (Signed)
Pt. Reports her mother has the Flu and is currently in the hospital. Pt. Denies any respiratory symptoms or fever. Has "an upset stomach." Reviewed home remedies with pt. When she feels better will get her flu vaccine.  Reason for Disposition . [1]Influenza EXPOSURE  (Close Contact) within last 7 days AND [2] NO respiratory symptoms  Answer Assessment - Initial Assessment Questions 1. TYPE of EXPOSURE: "How were you exposed?" (e.g., close contact, not a close contact)     Close contact 2. DATE of EXPOSURE: "When did the exposure occur?" (e.g., hour, days, weeks)     Yesterday 3. PREGNANCY: "Is there any chance you are pregnant?" "When was your last menstrual period?"     No 4. HIGH RISK for COMPLICATIONS: "Do you have any heart or lung problems? Do you have a weakened immune system?" (e.g., CHF, COPD, asthma, HIV positive, chemotherapy, renal failure, diabetes mellitus, sickle cell anemia)     No 5. SYMPTOMS: "Do you have any symptoms?" (e.g., cough, fever, sore throat, difficulty breathing).     Upset stomach  Protocols used: INFLUENZA EXPOSURE-A-AH

## 2018-11-21 NOTE — Telephone Encounter (Signed)
I called patient, in PT now

## 2018-11-22 ENCOUNTER — Ambulatory Visit (INDEPENDENT_AMBULATORY_CARE_PROVIDER_SITE_OTHER): Payer: BLUE CROSS/BLUE SHIELD

## 2018-11-22 ENCOUNTER — Telehealth: Payer: Self-pay | Admitting: Nurse Practitioner

## 2018-11-22 DIAGNOSIS — Z23 Encounter for immunization: Secondary | ICD-10-CM

## 2018-11-22 NOTE — Telephone Encounter (Signed)
Copied from CRM [phone]. Topic: Quick Communication - Other Results (Clinic Use ONLY) >> Nov 22, 2018  8:59 AM Merrilyn PumaSimmons, Stacey N, CMA wrote: Left message for pt to call back Re: recent DEXA results.  [facility] may inform patient of results.

## 2018-11-28 ENCOUNTER — Encounter: Payer: Self-pay | Admitting: *Deleted

## 2018-11-28 NOTE — Progress Notes (Signed)
Office Visit Note  Patient: Christine Ford             Date of Birth: 1961/04/22           MRN: [id number]             PCP: Evaristo Bury, NP Referring: Madelin Headings, MD Visit Date: 12/09/2018 Occupation: @GUAROCC @  Subjective:  Discuss medications   History of Present Illness: Christine Ford is a 57 y.o. female with history of osteoporosis and DDD.  She is taking Vitamin D 1,000 units by mouth daily.  She had a DEXA on 11/07/18, T-score -2.6.  She does not want to take Fosamax due to being apprehensive of side effects. She denies any recent falls or fractures. She has been doing weight lifting classes 2-3 times per week.  She would like to discuss other treatment options for osteoporosis.  She has been going to physical therapy for DDD-C. She has been 2 sessions, and she noticed immediate improvement in the neck pain she has been experiencing. She reports the right arm weakness has improved.    She denies any joint swelling. She has occasional right hip pain, which she feels is related to the position she sits in at her job.   Activities of Daily Living:  Patient reports morning stiffness for 1 hour.   Patient Denies nocturnal pain.  Difficulty dressing/grooming: Denies Difficulty climbing stairs: Denies Difficulty getting out of chair: Reports Difficulty using hands for taps, buttons, cutlery, and/or writing: Reports  Review of Systems  Constitutional: Negative for fatigue.  HENT: Positive for mouth dryness. Negative for mouth sores and nose dryness.   Eyes: Positive for dryness. Negative for pain and visual disturbance.  Respiratory: Negative for cough, hemoptysis, shortness of breath and difficulty breathing.   Cardiovascular: Negative for chest pain, palpitations, hypertension and swelling in legs/feet.  Gastrointestinal: Negative for blood in stool, constipation and diarrhea.  Endocrine: Negative for increased urination.  Genitourinary: Negative for difficulty  urinating and painful urination.  Musculoskeletal: Positive for arthralgias, joint pain and morning stiffness. Negative for joint swelling, myalgias, muscle weakness, muscle tenderness and myalgias.  Skin: Negative for color change, pallor, rash, hair loss, nodules/bumps, skin tightness, ulcers and sensitivity to sunlight.  Allergic/Immunologic: Negative for susceptible to infections.  Neurological: Negative for dizziness, numbness, headaches and weakness.  Hematological: Negative for bruising/bleeding tendency and swollen glands.  Psychiatric/Behavioral: Negative for depressed mood and sleep disturbance. The patient is not nervous/anxious.     PMFS History:  Patient Active Problem List   Diagnosis Date Noted  . Other idiopathic scoliosis, lumbar region 09/30/2018  . DDD (degenerative disc disease), cervical 05/06/2018  . Chronic left shoulder pain 05/06/2018  . History of TMJ disorder 05/06/2018  . Postmenopausal 07/29/2017  . History of palpitations/ Dr Eden Emms 2002 07/29/2017  . Osteoporosis 07/28/2017  . Family history of heart disease 07/28/2017  . Family history of dementia 07/28/2017  . Family history of breast cancer 06/03/2012  . Visit for preventive health examination 11/23/2011  . Neck pain 04/23/2010    Past Medical History:  Diagnosis Date  . Arthritis   . H/O echocardiogram 03/2017   nl  . Normal nuclear stress test    nl stress echo [name] 2002  . Osteopenia   . Temporomandibular joint disorder (TMJ)   . 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 Coronary artery disease Mother   . Hypertension Mother   . Osteoporosis Mother   . COPD Mother   . Hypertension Father   . Heart attack Father   . Heart disease Father [id number]  . Glaucoma Son        congenital glaucomea has shunt in good eye one enucleated  . Breast cancer Other        cousins  . Lung cancer  Other        aunts, uncles, cousin  . Cancer Maternal Aunt        Lung  . Heart disease Sister    Past Surgical History:  Procedure Laterality Date  . BREAST SURGERY    . MOLE REMOVAL    . 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  Constitutional:      Appearance: She is well-developed.  HENT:     Head: Normocephalic and atraumatic.  Eyes:     Conjunctiva/sclera: Conjunctivae normal.  Neck:     Musculoskeletal: Normal range of motion.  Cardiovascular:     Rate and Rhythm: Normal rate and regular rhythm.     Heart sounds: Normal heart sounds.  Pulmonary:     Effort: Pulmonary effort is normal.     Breath sounds: Normal breath sounds.  Abdominal:     General: Bowel sounds are normal.     Palpations: Abdomen is soft.  Lymphadenopathy:     Cervical: No cervical adenopathy.  Skin:    General: Skin is warm and dry.     Capillary Refill: Capillary refill takes less than 2 seconds.  Neurological:     Mental Status: She is alert and oriented to person, place, and time.  Psychiatric:        Behavior: Behavior normal.      Musculoskeletal Exam: C-spine, thoracic spine, lumbar spine good range of motion.  No midline spinal tenderness.  No SI joint tenderness.  Shoulder joints, elbow joints, wrist joints, MCPs, PIPs, DIPs good range of motion no synovitis.  She is complete fist formation bilaterally.  Hip joints, knee joints, ankle  joints, MTPs, PIPs, DIPs good range of motion with no synovitis.  No warmth or effusion of bilateral knee joints.  No tenderness or swelling of ankle joints.  No tenderness of trochanter bursa bilaterally.  CDAI Exam: CDAI Score: Not documented Patient Global Assessment: Not documented; Provider Global Assessment: Not documented Swollen: Not documented; Tender: Not documented Joint Exam   Not documented   There is currently no information documented on the homunculus. Go to the Rheumatology activity and complete the homunculus joint exam.  Investigation: No additional findings.  Imaging: No results found.  Recent  Labs: Lab Results  Component Value Date   WBC 6.0 04/04/2018   HGB 14.2 04/04/2018   PLT 239 04/04/2018   NA 140 04/04/2018   K 4.2 04/04/2018   CL 103 04/04/2018   CO2 31 04/04/2018   GLUCOSE 87 04/04/2018   BUN 24 04/04/2018   CREATININE 0.78 04/04/2018   BILITOT 0.9 04/04/2018   ALKPHOS 49 10/10/2014   AST 24 04/04/2018   ALT 13 04/04/2018   PROT 7.2 04/04/2018   PROT 7.3 04/04/2018   ALBUMIN 3.8 10/10/2014   CALCIUM 9.7 04/04/2018   CALCIUM 9.7 04/04/2018   GFRAA 98 04/04/2018    Speciality Comments: No specialty comments available.  Procedures:  No procedures performed Allergies: Patient has no known allergies.   Assessment / Plan:     Visit Diagnoses: Other osteoporosis without current pathological fracture - DEXA 11/07/18 Left femoral neck -2.6, -0.9% change from previous BMD. Lumbar spine -2.3, -3.1% change in BMD since last DEXA.  She has not had any recent falls or fractures.  She has been taking vitamin D 1,000 units by mouth daily.  She is apprehensive of the side effects of oral bisphosphonate.  She does not want to start on a treatment for osteoporosis at this time.  She would like to have a repeat bone density in 1 year, so she is going to check with her insurance for coverage. She was encouraged to continue to perform resistive exercises.   She has been going to weight lifting classes 2-3 times a week. She would like to return to the office in 1 year and have vitamin D checked at that visit.    DDD (degenerative disc disease), cervical - She has good ROM on exam with no discomfort.  She has no symptoms of radiculopathy at this time.  She has been to 2 session of physical therapy, and she noticed immediate relief after the first session.    Other idiopathic scoliosis, lumbar region: No midline spinal tenderness.  She has good ROM.    Chronic left shoulder pain: She has working with physical therapist.  Her pain and ROM have improved.    Orders: No orders of the defined types were placed in this encounter.  No orders of the defined types were placed in this encounter.    Follow-Up Instructions: No follow-ups on file.   Christine Bienenstock, PA-C  I examined and evaluated the patient with Christine Ales PA.  I detailed discussion with patient regarding osteoporosis.  Different medications and their side effects were discussed at length.  At this point she would like to hold off treatment and try muscle strengthening.  We discussed calcium vitamin D and resistive exercises.  Patient would like to have repeat DEXA in 1 year.  We will check a vitamin D level also in 1 year.  Her cervical and lumbar spine discomfort has improved with physical therapy.  She will continue with the exercises.  The plan of care was discussed as noted above.  Pollyann Savoy, MD  Note - This record has been created using Animal nutritionist.  Chart creation errors have been sought, but may not always  have been located. Such creation errors do not reflect on  the standard of medical care.

## 2018-12-02 ENCOUNTER — Encounter: Payer: Self-pay | Admitting: Nurse Practitioner

## 2018-12-09 ENCOUNTER — Ambulatory Visit: Payer: BLUE CROSS/BLUE SHIELD | Admitting: Rheumatology

## 2018-12-09 ENCOUNTER — Encounter: Payer: Self-pay | Admitting: Rheumatology

## 2018-12-09 VITALS — BP 109/62 | HR 67 | Resp 14 | Ht 64.0 in | Wt 113.2 lb

## 2018-12-09 DIAGNOSIS — M818 Other osteoporosis without current pathological fracture: Secondary | ICD-10-CM

## 2018-12-09 DIAGNOSIS — M25512 Pain in left shoulder: Secondary | ICD-10-CM | POA: Diagnosis not present

## 2018-12-09 DIAGNOSIS — M4126 Other idiopathic scoliosis, lumbar region: Secondary | ICD-10-CM

## 2018-12-09 DIAGNOSIS — M5030 Other cervical disc degeneration, unspecified cervical region: Secondary | ICD-10-CM

## 2018-12-09 DIAGNOSIS — G8929 Other chronic pain: Secondary | ICD-10-CM

## 2018-12-12 DIAGNOSIS — M5030 Other cervical disc degeneration, unspecified cervical region: Secondary | ICD-10-CM | POA: Diagnosis not present

## 2018-12-12 DIAGNOSIS — M419 Scoliosis, unspecified: Secondary | ICD-10-CM | POA: Diagnosis not present

## 2019-01-20 ENCOUNTER — Encounter: Payer: Self-pay | Admitting: Nurse Practitioner

## 2019-01-20 ENCOUNTER — Ambulatory Visit (INDEPENDENT_AMBULATORY_CARE_PROVIDER_SITE_OTHER): Payer: BLUE CROSS/BLUE SHIELD | Admitting: Nurse Practitioner

## 2019-01-20 VITALS — BP 110/64 | HR 60 | Ht 64.0 in | Wt 113.0 lb

## 2019-01-20 DIAGNOSIS — M818 Other osteoporosis without current pathological fracture: Secondary | ICD-10-CM | POA: Diagnosis not present

## 2019-01-20 DIAGNOSIS — F419 Anxiety disorder, unspecified: Secondary | ICD-10-CM | POA: Diagnosis not present

## 2019-01-20 DIAGNOSIS — Z13220 Encounter for screening for lipoid disorders: Secondary | ICD-10-CM | POA: Diagnosis not present

## 2019-01-20 DIAGNOSIS — Z0000 Encounter for general adult medical examination without abnormal findings: Secondary | ICD-10-CM | POA: Diagnosis not present

## 2019-01-20 MED ORDER — ESCITALOPRAM OXALATE 10 MG PO TABS: 10.0000 mg | ORAL_TABLET | Freq: Every day | ORAL | 1 refills | Status: DC

## 2019-01-20 NOTE — Patient Instructions (Addendum)
Please return to the lab downstairs for labwork when fasting- only water or black coffee for 6 hours prior.    I have sent a prescription for lexapro 10 tablets to your pharmacy. Please start 1/2 tablet once daily for 1 week and then increase to a full tablet once daily on week two as tolerated.  Some side effects such as nausea, drowsiness and weight gain can occur.  Also rarely people have experienced suicidal thoughts when taking this medication.  Please discontinue the medication and go directly to ED if this occurs.  Id like to see you back in about 1 month to evaluate progress.     Health Maintenance, Female Adopting a healthy lifestyle and getting preventive care can go a long way to promote health and wellness. Talk with your health care provider about what schedule of regular examinations is right for you. This is a good chance for you to check in with your provider about disease prevention and staying healthy. In between checkups, there are plenty of things you can do on your own. Experts have done a lot of research about which lifestyle changes and preventive measures are most likely to keep you healthy. Ask your health care provider for more information. Weight and diet Eat a healthy diet  Be sure to include plenty of vegetables, fruits, low-fat dairy products, and lean protein.  Do not eat a lot of foods high in solid fats, added sugars, or salt.  Get regular exercise. This is one of the most important things you can do for your health. ? Most adults should exercise for at least 150 minutes each week. The exercise should increase your heart rate and make you sweat (moderate-intensity exercise). ? Most adults should also do strengthening exercises at least twice a week. This is in addition to the moderate-intensity exercise. Maintain a healthy weight  Body mass index (BMI) is a measurement that can be used to identify possible weight problems. It estimates body fat based on height and  weight. Your health care provider can help determine your BMI and help you achieve or maintain a healthy weight.  For females 53 years of age and older: ? A BMI below 18.5 is considered underweight. ? A BMI of 18.5 to 24.9 is normal. ? A BMI of 25 to 29.9 is considered overweight. ? A BMI of 30 and above is considered obese. Watch levels of cholesterol and blood lipids  You should start having your blood tested for lipids and cholesterol at 58 years of age, then have this test every 5 years.  You may need to have your cholesterol levels checked more often if: ? Your lipid or cholesterol levels are high. ? You are older than 58 years of age. ? You are at high risk for heart disease. Cancer screening Lung Cancer  Lung cancer screening is recommended for adults 12-25 years old who are at high risk for lung cancer because of a history of smoking.  A yearly low-dose CT scan of the lungs is recommended for people who: ? Currently smoke. ? Have quit within the past 15 years. ? Have at least a 30-pack-year history of smoking. A pack year is smoking an average of one pack of cigarettes a day for 1 year.  Yearly screening should continue until it has been 15 years since you quit.  Yearly screening should stop if you develop a health problem that would prevent you from having lung cancer treatment. Breast Cancer  Practice breast self-awareness. This  means understanding how your breasts normally appear and feel.  It also means doing regular breast self-exams. Let your health care provider know about any changes, no matter how small.  If you are in your 20s or 30s, you should have a clinical breast exam (CBE) by a health care provider every 1-3 years as part of a regular health exam.  If you are 54 or older, have a CBE every year. Also consider having a breast X-ray (mammogram) every year.  If you have a family history of breast cancer, talk to your health care provider about genetic  screening.  If you are at high risk for breast cancer, talk to your health care provider about having an MRI and a mammogram every year.  Breast cancer gene (BRCA) assessment is recommended for women who have family members with BRCA-related cancers. BRCA-related cancers include: ? Breast. ? Ovarian. ? Tubal. ? Peritoneal cancers.  Results of the assessment will determine the need for genetic counseling and BRCA1 and BRCA2 testing. Cervical Cancer Your health care provider may recommend that you be screened regularly for cancer of the pelvic organs (ovaries, uterus, and vagina). This screening involves a pelvic examination, including checking for microscopic changes to the surface of your cervix (Pap test). You may be encouraged to have this screening done every 3 years, beginning at age 25.  For women ages 32-65, health care providers may recommend pelvic exams and Pap testing every 3 years, or they may recommend the Pap and pelvic exam, combined with testing for human papilloma virus (HPV), every 5 years. Some types of HPV increase your risk of cervical cancer. Testing for HPV may also be done on women of any age with unclear Pap test results.  Other health care providers may not recommend any screening for nonpregnant women who are considered low risk for pelvic cancer and who do not have symptoms. Ask your health care provider if a screening pelvic exam is right for you.  If you have had past treatment for cervical cancer or a condition that could lead to cancer, you need Pap tests and screening for cancer for at least 20 years after your treatment. If Pap tests have been discontinued, your risk factors (such as having a new sexual partner) need to be reassessed to determine if screening should resume. Some women have medical problems that increase the chance of getting cervical cancer. In these cases, your health care provider may recommend more frequent screening and Pap tests. Colorectal  Cancer  This type of cancer can be detected and often prevented.  Routine colorectal cancer screening usually begins at 58 years of age and continues through 58 years of age.  Your health care provider may recommend screening at an earlier age if you have risk factors for colon cancer.  Your health care provider may also recommend using home test kits to check for hidden blood in the stool.  A small camera at the end of a tube can be used to examine your colon directly (sigmoidoscopy or colonoscopy). This is done to check for the earliest forms of colorectal cancer.  Routine screening usually begins at age 28.  Direct examination of the colon should be repeated every 5-10 years through 58 years of age. However, you may need to be screened more often if early forms of precancerous polyps or small growths are found. Skin Cancer  Check your skin from head to toe regularly.  Tell your health care provider about any new moles or changes  in moles, especially if there is a change in a mole's shape or color.  Also tell your health care provider if you have a mole that is larger than the size of a pencil eraser.  Always use sunscreen. Apply sunscreen liberally and repeatedly throughout the day.  Protect yourself by wearing long sleeves, pants, a wide-brimmed hat, and sunglasses whenever you are outside. Heart disease, diabetes, and high blood pressure  High blood pressure causes heart disease and increases the risk of stroke. High blood pressure is more likely to develop in: ? People who have blood pressure in the high end of the normal range (130-139/85-89 mm Hg). ? People who are overweight or obese. ? People who are African American.  If you are 48-57 years of age, have your blood pressure checked every 3-5 years. If you are 67 years of age or older, have your blood pressure checked every year. You should have your blood pressure measured twice-once when you are at a hospital or clinic,  and once when you are not at a hospital or clinic. Record the average of the two measurements. To check your blood pressure when you are not at a hospital or clinic, you can use: ? An automated blood pressure machine at a pharmacy. ? A home blood pressure monitor.  If you are between 37 years and 65 years old, ask your health care provider if you should take aspirin to prevent strokes.  Have regular diabetes screenings. This involves taking a blood sample to check your fasting blood sugar level. ? If you are at a normal weight and have a low risk for diabetes, have this test once every three years after 58 years of age. ? If you are overweight and have a high risk for diabetes, consider being tested at a younger age or more often. Preventing infection Hepatitis B  If you have a higher risk for hepatitis B, you should be screened for this virus. You are considered at high risk for hepatitis B if: ? You were born in a country where hepatitis B is common. Ask your health care provider which countries are considered high risk. ? Your parents were born in a high-risk country, and you have not been immunized against hepatitis B (hepatitis B vaccine). ? You have HIV or AIDS. ? You use needles to inject street drugs. ? You live with someone who has hepatitis B. ? You have had sex with someone who has hepatitis B. ? You get hemodialysis treatment. ? You take certain medicines for conditions, including cancer, organ transplantation, and autoimmune conditions. Hepatitis C  Blood testing is recommended for: ? Everyone born from [birth year] through 1965. ? Anyone with known risk factors for hepatitis C. Sexually transmitted infections (STIs)  You should be screened for sexually transmitted infections (STIs) including gonorrhea and chlamydia if: ? You are sexually active and are younger than 58 years of age. ? You are older than 58 years of age and your health care provider tells you that you are at risk  for this type of infection. ? Your sexual activity has changed since you were last screened and you are at an increased risk for chlamydia or gonorrhea. Ask your health care provider if you are at risk.  If you do not have HIV, but are at risk, it may be recommended that you take a prescription medicine daily to prevent HIV infection. This is called pre-exposure prophylaxis (PrEP). You are considered at risk if: ? You are sexually active  and do not regularly use condoms or know the HIV status of your partner(s). ? You take drugs by injection. ? You are sexually active with a partner who has HIV. Talk with your health care provider about whether you are at high risk of being infected with HIV. If you choose to begin PrEP, you should first be tested for HIV. You should then be tested every 3 months for as long as you are taking PrEP. Pregnancy  If you are premenopausal and you may become pregnant, ask your health care provider about preconception counseling.  If you may become pregnant, take 400 to 800 micrograms (mcg) of folic acid every day.  If you want to prevent pregnancy, talk to your health care provider about birth control (contraception). Osteoporosis and menopause  Osteoporosis is a disease in which the bones lose minerals and strength with aging. This can result in serious bone fractures. Your risk for osteoporosis can be identified using a bone density scan.  If you are 58 years of age or older, or if you are at risk for osteoporosis and fractures, ask your health care provider if you should be screened.  Ask your health care provider whether you should take a calcium or vitamin D supplement to lower your risk for osteoporosis.  Menopause may have certain physical symptoms and risks.  Hormone replacement therapy may reduce some of these symptoms and risks. Talk to your health care provider about whether hormone replacement therapy is right for you. Follow these instructions at  home:  Schedule regular health, dental, and eye exams.  Stay current with your immunizations.  Do not use any tobacco products including cigarettes, chewing tobacco, or electronic cigarettes.  If you are pregnant, do not drink alcohol.  If you are breastfeeding, limit how much and how often you drink alcohol.  Limit alcohol intake to no more than 1 drink per day for nonpregnant women. One drink equals 12 ounces of beer, 5 ounces of wine, or 1 ounces of hard liquor.  Do not use street drugs.  Do not share needles.  Ask your health care provider for help if you need support or information about quitting drugs.  Tell your health care provider if you often feel depressed.  Tell your health care provider if you have ever been abused or do not feel safe at home. This information is not intended to replace advice given to you by your health care provider. Make sure you discuss any questions you have with your health care provider. Document Released: 06/29/2011 Document Revised: 05/21/2016 Document Reviewed: 09/17/2015 Elsevier Interactive Patient Education  2019 [employer].

## 2019-01-20 NOTE — Progress Notes (Signed)
Christine Ford is a 58 y.o. female with the following history as recorded in [employer]:  Patient Active Problem List   Diagnosis Date Noted  . Other idiopathic scoliosis, lumbar region 09/30/2018  . DDD (degenerative disc disease), cervical 05/06/2018  . Chronic left shoulder pain 05/06/2018  . History of TMJ disorder 05/06/2018  . Postmenopausal 07/29/2017  . History of palpitations/ Dr Eden Emms 2002 07/29/2017  . Osteoporosis 07/28/2017  . Family history of heart disease 07/28/2017  . Family history of dementia 07/28/2017  . Family history of breast cancer 06/03/2012  . Visit for preventive health examination 11/23/2011  . Neck pain 04/23/2010    Current Outpatient Medications  Medication Sig Dispense Refill  . Cholecalciferol (VITAMIN D3) 1000 units CAPS     . Multiple Vitamins-Minerals (MULTIVITAMIN,TX-MINERALS) tablet Take 1 tablet by mouth daily.      Marland Kitchen escitalopram (LEXAPRO) 10 MG tablet Take 1 tablet (10 mg total) by mouth daily. 30 tablet 1   No current facility-administered medications for this visit.     Allergies: Patient has no known allergies.  Past Medical History:  Diagnosis Date  . Arthritis   . H/O echocardiogram 03/2017   nl  . Normal nuclear stress test    nl stress echo [name] 2002  . Osteopenia   . Temporomandibular joint disorder (TMJ)   . 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 BREAST SURGERY    . MOLE REMOVAL    . WISDOM TOOTH EXTRACTION      Family History  Problem Relation Age of Onset  . Coronary artery disease Mother   . Hypertension Mother   . Osteoporosis Mother   . COPD Mother   . Hypertension Father   . Heart attack Father   . Heart disease Father 97  . Glaucoma Son        congenital glaucomea has shunt in good eye one enucleated  . Breast cancer Other        cousins  . Lung cancer Other        aunts, uncles,  cousin  . Cancer Maternal Aunt        Lung  . Heart disease Sister     Social History   Tobacco Use  . Smoking status: Never Smoker  . Smokeless tobacco: Never Used  . Tobacco comment: Passive smoke exposure   Substance Use Topics  . Alcohol use: Yes    Comment: socially     Subjective:  Here today for CPE-  Last dental exam: 2020 Last vision exam: 2019 Mammogram: Overdue, but has been ordered/scheduled by gyn per pt PAP: up to date by GYN No LMP recorded. Patient is postmenopausal. Colonoscopy: up to date Lipids: lipid panel ordered DM screening- not indicated Vaccinations: up to date Diet and exercise: need more exercise, weight bearing, only going once a week to gym now, has been too busy, but Is eating a good, healthy diet   Review of Systems  Constitutional: Negative for chills and fever.  HENT: Negative for hearing loss.   Eyes: Negative for blurred vision and double vision.  Respiratory: Negative for cough and shortness of breath.   Cardiovascular: Negative for chest pain and palpitations.  Gastrointestinal: Negative for abdominal pain, constipation, diarrhea, heartburn, nausea and vomiting.  Genitourinary: Negative for dysuria and hematuria.  Musculoskeletal: Negative for falls.  Skin: Negative for rash.  Neurological: Negative for dizziness, speech change, loss of consciousness, weakness and headaches.  Endo/Heme/Allergies: Does not bruise/bleed easily.  Psychiatric/Behavioral: Negative for depression, memory loss and suicidal ideas. The patient is nervous/anxious. The patient does not have insomnia.    Anxiety- says she often has trouble focusing, worries, says there is "a lot on my plate"  Works full time as Armed forces operational officerdental hygienist, 3 sons that she is very busy with, mother in assisted living that she visits/helps every day Has been on lexapro in past and would like to restart to see if this helps her mood. She Is also wondering if she has ADD, since this runs in her  family  Objective:  Vitals:   01/20/19 1045  BP: 110/64  Pulse: 60  SpO2: 99%  Weight: 113 lb (51.3 kg)  Height: 5\' 4"  (1.626 m)    General: Well developed, well nourished, in no acute distress  Skin : Warm and dry.  Head: Normocephalic and atraumatic  Eyes: Sclera and conjunctiva clear; pupils round and reactive to light; extraocular movements intact  Ears: External normal; canals clear; tympanic membranes normal  Oropharynx: Pink, supple. No suspicious lesions  Neck: Supple without thyromegaly, adenopathy  Lungs: Respirations unlabored; clear to auscultation bilaterally without wheeze, rales, rhonchi  CVS exam: normal rate and regular rhythm, S1 and S2 normal.  Abdomen: Soft; nontender; nondistended; normoactive bowel sounds; no masses or hepatosplenomegaly  Musculoskeletal: No deformities; no active joint inflammation  Extremities: No edema, cyanosis, clubbing  Vessels: Symmetric bilaterally  Neurologic: Alert and oriented; speech intact; face symmetrical; moves all extremities well; CNII-XII intact without focal deficit  Psychiatric: Normal mood and affect.  Assessment:  1. Routine general medical examination at a health care facility   2. Screening for cholesterol level   3. Other osteoporosis without current pathological fracture   4. Anxiety     Plan:   Return in about 1 month (around 02/20/2019) for F/U: anxiety - starting lexapro.  Orders Placed This Encounter  Procedures  . CBC    Standing Status:   Future    Standing Expiration Date:   01/21/2020  . Comprehensive metabolic panel    Standing Status:   Future    Standing Expiration Date:   01/21/2020  . Lipid panel    Standing Status:   Future    Standing Expiration Date:   01/21/2020  . Vitamin D (25 hydroxy)    Standing Status:   Future    Standing Expiration Date:   01/20/2020  . TSH    Standing Status:   Future    Standing Expiration Date:   01/21/2020    Requested Prescriptions   Signed Prescriptions  Disp Refills  . escitalopram (LEXAPRO) 10 MG tablet 30 tablet 1    Sig: Take 1 tablet (10 mg total) by mouth daily.

## 2019-01-20 NOTE — Assessment & Plan Note (Signed)
Update labs Start lexapro-medication dosing, side effects discussed RTC in 1 month for F/U- check response to medicine, consider referral for ADD evaluation if needed - CBC; Future - Comprehensive metabolic panel; Future - escitalopram (LEXAPRO) 10 MG tablet; Take 1 tablet (10 mg total) by mouth daily.  Dispense: 30 tablet; Refill: 1 - TSH; Future

## 2019-01-20 NOTE — Assessment & Plan Note (Signed)
Reviewed annual screening exams, healthy lifestyle,  additional information provided on AVS Mammogram scheduled - CBC; Future - Comprehensive metabolic panel; Future - Lipid panel; Future - Vitamin D (25 hydroxy); Future  Screening for cholesterol level She will return for labs when fasting - Lipid panel; Future

## 2019-01-20 NOTE — Assessment & Plan Note (Signed)
Continue ca, vitamin D, weight bearing Update labs DEXA done 10/2018 She is following with rheumatology for continued management of osteoporosis - Comprehensive metabolic panel; Future - Vitamin D (25 hydroxy); Future

## 2019-01-27 DIAGNOSIS — M5030 Other cervical disc degeneration, unspecified cervical region: Secondary | ICD-10-CM | POA: Diagnosis not present

## 2019-01-27 DIAGNOSIS — M419 Scoliosis, unspecified: Secondary | ICD-10-CM | POA: Diagnosis not present

## 2019-02-13 DIAGNOSIS — M419 Scoliosis, unspecified: Secondary | ICD-10-CM | POA: Diagnosis not present

## 2019-02-13 DIAGNOSIS — M5030 Other cervical disc degeneration, unspecified cervical region: Secondary | ICD-10-CM | POA: Diagnosis not present

## 2019-02-24 ENCOUNTER — Ambulatory Visit: Payer: BLUE CROSS/BLUE SHIELD | Admitting: Nurse Practitioner

## 2019-02-24 ENCOUNTER — Other Ambulatory Visit (INDEPENDENT_AMBULATORY_CARE_PROVIDER_SITE_OTHER): Payer: BLUE CROSS/BLUE SHIELD

## 2019-02-24 DIAGNOSIS — M818 Other osteoporosis without current pathological fracture: Secondary | ICD-10-CM

## 2019-02-24 DIAGNOSIS — F419 Anxiety disorder, unspecified: Secondary | ICD-10-CM | POA: Diagnosis not present

## 2019-02-24 DIAGNOSIS — Z0000 Encounter for general adult medical examination without abnormal findings: Secondary | ICD-10-CM

## 2019-02-24 DIAGNOSIS — Z13220 Encounter for screening for lipoid disorders: Secondary | ICD-10-CM

## 2019-02-24 LAB — COMPREHENSIVE METABOLIC PANEL
ALBUMIN: 4.5 g/dL (ref 3.5–5.2)
ALK PHOS: 47 U/L (ref 39–117)
ALT: 13 U/L (ref 0–35)
AST: 23 U/L (ref 0–37)
BUN: 18 mg/dL (ref 6–23)
CALCIUM: 9.8 mg/dL (ref 8.4–10.5)
CHLORIDE: 104 meq/L (ref 96–112)
CO2: 30 meq/L (ref 19–32)
CREATININE: 0.82 mg/dL (ref 0.40–1.20)
GFR: 71.71 mL/min (ref >60.00)
Glucose, Bld: 86 mg/dL (ref 70–99)
Potassium: 4.1 mEq/L (ref 3.5–5.1)
Sodium: 141 mEq/L (ref 135–145)
Total Bilirubin: 0.8 mg/dL (ref 0.2–1.2)
Total Protein: 6.9 g/dL (ref 6.0–8.3)

## 2019-02-24 LAB — TSH: TSH: 3.13 u[IU]/mL (ref 0.35–4.50)

## 2019-02-24 LAB — LIPID PANEL
CHOL/HDL RATIO: 2
Cholesterol: 188 mg/dL (ref 0–200)
HDL: 80.7 mg/dL (ref >39.00)
LDL CALC: 95 mg/dL (ref 0–99)
NonHDL: 107.69
TRIGLYCERIDES: 64 mg/dL (ref 0.0–149.0)
VLDL: 12.8 mg/dL (ref 0.0–40.0)

## 2019-02-24 LAB — VITAMIN D 25 HYDROXY (VIT D DEFICIENCY, FRACTURES): VITD: 62.04 ng/mL (ref 30.00–100.00)

## 2019-02-24 LAB — CBC
HEMATOCRIT: 40.9 % (ref 36.0–46.0)
HEMOGLOBIN: 14 g/dL (ref 12.0–15.0)
MCHC: 34.2 g/dL (ref 30.0–36.0)
MCV: 94.7 fl (ref 78.0–100.0)
Platelets: 221 10*3/uL (ref 150.0–400.0)
RBC: 4.32 Mil/uL (ref 3.87–5.11)
RDW: 12.7 % (ref 11.5–15.5)
WBC: 3.4 10*3/uL — AB (ref 4.0–10.5)

## 2019-03-03 ENCOUNTER — Encounter: Payer: Self-pay | Admitting: Nurse Practitioner

## 2019-03-03 ENCOUNTER — Ambulatory Visit: Payer: BLUE CROSS/BLUE SHIELD | Admitting: Nurse Practitioner

## 2019-03-03 VITALS — BP 138/84 | HR 56 | Ht 64.0 in | Wt 113.0 lb

## 2019-03-03 DIAGNOSIS — Z1211 Encounter for screening for malignant neoplasm of colon: Secondary | ICD-10-CM | POA: Diagnosis not present

## 2019-03-03 DIAGNOSIS — F419 Anxiety disorder, unspecified: Secondary | ICD-10-CM

## 2019-03-03 MED ORDER — ESCITALOPRAM OXALATE 10 MG PO TABS: 10.0000 mg | ORAL_TABLET | Freq: Every day | ORAL | 3 refills | Status: DC

## 2019-03-03 NOTE — Assessment & Plan Note (Signed)
Improved on lexapro, continue current dosage RTC in 6 months for follow up, to ensure stability, or sooner for any new or worsening symptoms  - escitalopram (LEXAPRO) 10 MG tablet; Take 1 tablet (10 mg total) by mouth daily.  Dispense: 90 tablet; Refill: 3

## 2019-03-03 NOTE — Patient Instructions (Signed)
You will be contacted to schedule colonoscopy with [facility]  Please follow up in 6 months to make sure you are still doing okay on lexapro!  Thanks for letting me take care of you!!!

## 2019-03-03 NOTE — Progress Notes (Signed)
Christine Ford is a 58 y.o. female with the following history as recorded in EpicCare:  Patient Active Problem List   Diagnosis Date Noted  . Other idiopathic scoliosis, lumbar region 09/30/2018  . DDD (degenerative disc disease), cervical 05/06/2018  . Chronic left shoulder pain 05/06/2018  . History of TMJ disorder 05/06/2018  . Postmenopausal 07/29/2017  . History of palpitations/ Dr Eden Emms 2002 07/29/2017  . Osteoporosis 07/28/2017  . Family history of heart disease 07/28/2017  . Family history of dementia 07/28/2017  . Family history of breast cancer 06/03/2012  . Routine general medical examination at a health care facility 11/23/2011  . Neck pain 04/23/2010  . Anxiety 09/28/2007    Current Outpatient Medications  Medication Sig Dispense Refill  . Cholecalciferol (VITAMIN D3) 1000 units CAPS     . escitalopram (LEXAPRO) 10 MG tablet Take 1 tablet (10 mg total) by mouth daily. 90 tablet 3  . Multiple Vitamins-Minerals (MULTIVITAMIN,TX-MINERALS) tablet Take 1 tablet by mouth daily.       No current facility-administered medications for this visit.     Allergies: Patient has no known allergies.  Past Medical History:  Diagnosis Date  . Arthritis   . H/O echocardiogram 03/2017   nl  . Normal nuclear stress test    nl stress echo [name] 2002  . Osteopenia   . Temporomandibular joint disorder (TMJ)   . 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 BREAST SURGERY    . MOLE REMOVAL    . WISDOM TOOTH EXTRACTION      Family History  Problem Relation Age of Onset  . Coronary artery disease Mother   . Hypertension Mother   . Osteoporosis Mother   . COPD Mother   . Hypertension Father   . Heart attack Father   . Heart disease Father 54  . Glaucoma Son        congenital glaucomea has shunt in good eye one enucleated  . Breast cancer Other        cousins  .  Lung cancer Other        aunts, uncles, cousin  . Cancer Maternal Aunt        Lung  . Heart disease Sister     Social History   Tobacco Use  . Smoking status: Never Smoker  . Smokeless tobacco: Never Used  . Tobacco comment: Passive smoke exposure   Substance Use Topics  . Alcohol use: Yes    Comment: socially     Subjective:  [name] is here today for a follow up of anxiety, At her CPE on 01/20/19 she told me that she often felt like she worried a lot, felt anxious, was having trouble focusing on tasks, she been on lexapro in the past and wanted to resume. Today, she tells me that she has been taking lexapro daily as instructed, now taking 10mg  daily, and has noticed good improvement in her symptoms, not worrying as much, feels less anxious. She did notice some GI upset when she first started the lexapro but this has resolved, and she would like to continue taking the medication at current dosage She is also requesting referral for screening colonoscopy, last done in 2015, told she was given 5 year recall, last colonoscopy was done by Dr Loreta Ave, however shed like to go to [facility] for next colonoscopy.  ROS- See HPI  Objective:  Vitals:   03/03/19 0855  BP: 138/84  Pulse: (!) 56  SpO2: 97%  Weight: 113 lb (51.3 kg)  Height: 5\' 4"  (1.626 m)    General: Well developed, well nourished, in no acute distress  Skin : Warm and dry.  Head: Normocephalic and atraumatic  Eyes: Sclera and conjunctiva clear; pupils round and reactive to light; extraocular movements intact  Oropharynx: Pink, supple. No suspicious lesions  Neck: Supple Lungs: Respirations unlabored; clear to auscultation bilaterally without wheeze, rales, rhonchi  CVS exam: normal rate and regular rhythm, S1 and S2 normal.  Extremities: No edema, cyanosis, clubbing  Vessels: Symmetric bilaterally  Neurologic: Alert and oriented; speech intact; face symmetrical; moves all extremities well; CNII-XII intact without focal  deficit  Psychiatric: Normal mood and affect.  Assessment:  1. Screening for colon cancer   2. Anxiety     Plan:   Screening for colon cancer Referral placed - Ambulatory referral to Gastroenterology  Return in about 6 months (around 09/03/2019) for follow up- anxiety.

## 2019-05-12 DIAGNOSIS — M5030 Other cervical disc degeneration, unspecified cervical region: Secondary | ICD-10-CM | POA: Diagnosis not present

## 2019-05-12 DIAGNOSIS — M419 Scoliosis, unspecified: Secondary | ICD-10-CM | POA: Diagnosis not present

## 2019-06-04 DIAGNOSIS — M25562 Pain in left knee: Secondary | ICD-10-CM | POA: Insufficient documentation

## 2019-06-05 DIAGNOSIS — M25562 Pain in left knee: Secondary | ICD-10-CM | POA: Diagnosis not present

## 2019-06-05 DIAGNOSIS — M2392 Unspecified internal derangement of left knee: Secondary | ICD-10-CM | POA: Diagnosis not present

## 2019-06-15 ENCOUNTER — Telehealth: Payer: Self-pay

## 2019-06-15 NOTE — Telephone Encounter (Signed)
Copied from [place] [id number]. Topic: Appointment Scheduling - Scheduling Inquiry for Clinic >> Jun 15, 2019  1:43 PM Yvette Rack wrote: Reason for CRM: Pt called in regarding the appt scheduled for 07/07/19. Attempted to transfer pt to reschedule the appt but we were placed on hold. After holding for a while pt stated she was on her lunch break and could not continue to hold on. Pt requests call back to reschedule the appt. Cb# [phone]

## 2019-06-16 ENCOUNTER — Telehealth: Payer: Self-pay | Admitting: Physical Therapy

## 2019-06-16 NOTE — Telephone Encounter (Signed)
Pt returning call about needing to r/s TOC appt w/ Juleen China on 07/07/19. Pt needs an appt on either a Monday or Friday.  Attempted to schedule pt on 07/10/19 but wasn't able due to blocks and error messages.  Please return pt's call at [phone].

## 2019-07-07 ENCOUNTER — Encounter: Payer: BLUE CROSS/BLUE SHIELD | Admitting: Family Medicine

## 2019-07-21 ENCOUNTER — Encounter: Payer: BC Managed Care – PPO | Admitting: Family Medicine

## 2019-07-28 ENCOUNTER — Ambulatory Visit (INDEPENDENT_AMBULATORY_CARE_PROVIDER_SITE_OTHER): Payer: BC Managed Care – PPO | Admitting: Family Medicine

## 2019-07-28 ENCOUNTER — Encounter: Payer: Self-pay | Admitting: Family Medicine

## 2019-07-28 ENCOUNTER — Other Ambulatory Visit: Payer: Self-pay

## 2019-07-28 VITALS — BP 120/80 | HR 88 | Temp 95.6°F | Ht 64.0 in | Wt 113.0 lb

## 2019-07-28 DIAGNOSIS — M2669 Other specified disorders of temporomandibular joint: Secondary | ICD-10-CM | POA: Diagnosis not present

## 2019-07-28 DIAGNOSIS — Z818 Family history of other mental and behavioral disorders: Secondary | ICD-10-CM | POA: Diagnosis not present

## 2019-07-28 DIAGNOSIS — Z87898 Personal history of other specified conditions: Secondary | ICD-10-CM

## 2019-07-28 DIAGNOSIS — Z8249 Family history of ischemic heart disease and other diseases of the circulatory system: Secondary | ICD-10-CM

## 2019-07-28 DIAGNOSIS — F419 Anxiety disorder, unspecified: Secondary | ICD-10-CM

## 2019-07-28 DIAGNOSIS — M26649 Arthritis of unspecified temporomandibular joint: Secondary | ICD-10-CM

## 2019-07-28 DIAGNOSIS — F5101 Primary insomnia: Secondary | ICD-10-CM

## 2019-07-28 DIAGNOSIS — Z1211 Encounter for screening for malignant neoplasm of colon: Secondary | ICD-10-CM

## 2019-07-28 DIAGNOSIS — M818 Other osteoporosis without current pathological fracture: Secondary | ICD-10-CM

## 2019-07-28 DIAGNOSIS — M5030 Other cervical disc degeneration, unspecified cervical region: Secondary | ICD-10-CM

## 2019-07-28 MED ORDER — ESCITALOPRAM OXALATE 10 MG PO TABS: 10.0000 mg | ORAL_TABLET | Freq: Every day | ORAL | 3 refills | Status: DC

## 2019-07-28 NOTE — Progress Notes (Signed)
Virtual Visit via Video   Due to the COVID-19 pandemic, this visit was completed with telemedicine (audio/video) technology to reduce patient and provider exposure as well as to preserve personal protective equipment.   I connected with Christine Ford by a video enabled telemedicine application and verified that I am speaking with the correct person using two identifiers. Location patient: Home Location provider: [facility], Office Persons participating in the virtual visit: Leon Goodnow, Briscoe Deutscher, DO   I discussed the limitations of evaluation and management by telemedicine and the availability of in person appointments. The patient expressed understanding and agreed to proceed.  Care Team   Patient Care Team: Briscoe Deutscher, DO as PCP - General (Family Medicine) Elsie Stain, MD (Pulmonary Disease) Paula Compton, MD (Obstetrics and Gynecology) Bo Merino, MD as Consulting Physician (Rheumatology)  Subjective:   HPI: See Assessment and Plan section for Problem Based Charting of issues discussed today.   Review of Systems  Constitutional: Negative for chills, fever, malaise/fatigue and weight loss.  Respiratory: Negative for cough, shortness of breath and wheezing.   Cardiovascular: Negative for chest pain, palpitations and leg swelling.  Gastrointestinal: Negative for abdominal pain, constipation, diarrhea, nausea and vomiting.  Genitourinary: Negative for dysuria and urgency.  Musculoskeletal: Positive for back pain. Negative for joint pain and myalgias.  Skin: Negative for rash.  Neurological: Negative for dizziness and headaches.  Psychiatric/Behavioral: Negative for depression, substance abuse and suicidal ideas. The patient is not nervous/anxious.     Patient Active Problem List   Diagnosis Date Noted  . Atrophic vaginitis 08/06/2019  . Other idiopathic scoliosis, lumbar region 09/30/2018  . DDD (degenerative disc disease), cervical 05/06/2018    . Chronic left shoulder pain 05/06/2018  . Postmenopausal 07/29/2017  . History of palpitations with evaluation by cardiology 07/29/2017  . Osteoporosis, followed by Rheumatology 08-22-17  . Family history of heart disease, father died age 17s, MGM age 17s 08-22-2017  . Family history of dementia, mother, age > 49 Aug 22, 2017  . Primary insomnia 09/26/2008  . Anxiety, controlled with Lexapro 09/28/2007  . 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 Smoking status: Never Smoker  . Smokeless tobacco: Never Used  . Tobacco comment: Passive smoke exposure   Substance Use Topics  . Alcohol use: Yes    Comment: socially    Current Outpatient Medications:  .  Cholecalciferol (VITAMIN D3) 1000 units CAPS, , Disp: , Rfl:  .  escitalopram (LEXAPRO) 10 MG tablet, Take 1 tablet (10 mg total) by mouth daily., Disp: 90 tablet, Rfl: 3 .  Multiple Vitamins-Minerals (MULTIVITAMIN,TX-MINERALS) tablet, Take 1 tablet by mouth daily.  , Disp: , Rfl:   No Known Allergies  Objective:   VITALS: Per patient if applicable, see vitals. GENERAL: Alert, appears well and in no acute distress. HEENT: Atraumatic, conjunctiva clear, no obvious abnormalities on inspection of external nose and ears. NECK: Normal movements of the head and neck. CARDIOPULMONARY: No increased WOB. Speaking in clear sentences. I:E ratio WNL.  MS: Moves all visible extremities without noticeable abnormality. PSYCH: Pleasant and cooperative, well-groomed. Speech normal rate and rhythm. Affect is appropriate. Insight and judgement are appropriate. Attention is focused, linear, and appropriate.  NEURO: CN grossly intact. Oriented as arrived to appointment on time with no prompting. Moves both UE equally.  SKIN: No obvious lesions, wounds, erythema, or cyanosis noted on face or hands.  Depression screen [facility] 2/9 07/28/2019 01/20/2019 Aug 22, 2017  Decreased Interest 0 0 0  Down, Depressed, Hopeless 0 0 0  PHQ - 2 Score 0  0 0  Altered sleeping 0 - -  Tired, decreased energy 0 - -  Change in appetite 0 - -  Feeling bad or failure about yourself  0 - -  Trouble concentrating 0 - -  Moving slowly or fidgety/restless 0 - -  Suicidal thoughts 0 - -  PHQ-9 Score 0 - -  Difficult doing work/chores Not difficult at all - -   Assessment and Plan:   Patient Active Problem List   Diagnosis Date Noted  . Atrophic vaginitis 08/06/2019  . Other idiopathic scoliosis, lumbar region 09/30/2018  . DDD (degenerative disc disease), cervical 05/06/2018  . Chronic left shoulder pain 05/06/2018  . Postmenopausal 07/29/2017  . History of palpitations with evaluation by cardiology 07/29/2017  . Osteoporosis, followed by Rheumatology 07/28/2017  . Family history of heart disease, father died age 4850s, MGM age 10540s 07/28/2017  . Family history of dementia, mother, age > 3180 07/28/2017  . Primary insomnia 09/26/2008  . Anxiety, controlled with Lexapro 09/28/2007  . 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  Continue current treatment.  Primary insomnia Previous evaluation by sleep medicine.  Thought to be due to psychophysiologic insomnia, however also consistent with TMJ and anxiety.  CBT was recommended as well as follow-up with dentist.  Osteoporosis, followed by Rheumatology Vitamin D and calcium recommended.  Continue exercise.  Followed by Dr. Lannette Donatheborah Schwark, rheumatology.  Looks like a repeat DEXA has already been ordered.  We reviewed the importance of continued mobility and fall precautions going forward I did recommend PT Pilates in town for strengthening.  Family history of heart disease, father died age 5550s, MGM age 3540s Strong family history of coronary artery disease.  Personal history of palpitations.  Echo in 2018 reviewed and was essentially normal.  Non-smoker by history of long-term smoke exposure.  We discussed this at length.  Will maximize risk reduction.  Patient will  schedule a physical in January and we can check lipids at that time.  In the meantime, will add cardiac CT.  Patient is aware that this is self-pay.  Orders Placed This Encounter  Procedures  . CT CARDIAC SCORING  . Ambulatory referral to Gastroenterology  . ECHOCARDIOGRAM COMPLETE   . COVID-19 Education: The signs and symptoms of COVID-19 were discussed with the patient and how to seek care for testing if needed. The importance of social distancing was discussed today. . Reviewed expectations re: course of current medical issues. . Discussed self-management of symptoms. . Outlined signs and symptoms indicating need for more acute intervention. . Patient verbalized understanding and all questions were answered. Marland Kitchen. Health Maintenance issues including appropriate healthy diet, exercise, and smoking avoidance were discussed with patient. . See orders for this visit as documented in the electronic medical record.  Helane RimaErica Ammara Raj, DO  Records requested if needed. Time spent: 25 minutes, of which >50% was spent in obtaining information about her symptoms, reviewing her previous labs, evaluations, and treatments, counseling her about her condition (please see the discussed topics above), and developing a plan to further investigate it; she had a number of questions which I addressed.

## 2019-07-31 ENCOUNTER — Telehealth: Payer: Self-pay

## 2019-07-31 ENCOUNTER — Encounter (HOSPITAL_COMMUNITY): Payer: Self-pay | Admitting: Family Medicine

## 2019-07-31 DIAGNOSIS — L821 Other seborrheic keratosis: Secondary | ICD-10-CM | POA: Diagnosis not present

## 2019-07-31 DIAGNOSIS — D2261 Melanocytic nevi of right upper limb, including shoulder: Secondary | ICD-10-CM | POA: Diagnosis not present

## 2019-07-31 DIAGNOSIS — D225 Melanocytic nevi of trunk: Secondary | ICD-10-CM | POA: Diagnosis not present

## 2019-07-31 DIAGNOSIS — L819 Disorder of pigmentation, unspecified: Secondary | ICD-10-CM | POA: Diagnosis not present

## 2019-07-31 NOTE — Telephone Encounter (Signed)
Copied from [place] [id number]. Topic: General - Other >> Jul 31, 2019  4:46 PM Ivar Drape wrote: Reason for CRM:  Patient stated on her 07/28/2019 virtual visit Dr. Juleen China recommended a place to go for her osteo pilate exercises. She cannot remember where Dr. Juleen China told her to go.  Please advise.

## 2019-08-02 ENCOUNTER — Other Ambulatory Visit (HOSPITAL_COMMUNITY): Payer: BC Managed Care – PPO

## 2019-08-02 ENCOUNTER — Other Ambulatory Visit: Payer: Self-pay

## 2019-08-02 DIAGNOSIS — Z8249 Family history of ischemic heart disease and other diseases of the circulatory system: Secondary | ICD-10-CM

## 2019-08-02 NOTE — Telephone Encounter (Signed)
Called patient gave information.  °

## 2019-08-05 ENCOUNTER — Other Ambulatory Visit: Payer: Self-pay | Admitting: Family Medicine

## 2019-08-05 ENCOUNTER — Other Ambulatory Visit: Payer: Self-pay

## 2019-08-05 DIAGNOSIS — Z20822 Contact with and (suspected) exposure to covid-19: Secondary | ICD-10-CM

## 2019-08-05 DIAGNOSIS — R6889 Other general symptoms and signs: Secondary | ICD-10-CM | POA: Diagnosis not present

## 2019-08-06 ENCOUNTER — Encounter: Payer: Self-pay | Admitting: Family Medicine

## 2019-08-06 DIAGNOSIS — N952 Postmenopausal atrophic vaginitis: Secondary | ICD-10-CM | POA: Insufficient documentation

## 2019-08-06 LAB — NOVEL CORONAVIRUS, NAA: SARS-CoV-2, NAA: NOT DETECTED

## 2019-08-06 NOTE — Assessment & Plan Note (Signed)
Vitamin D and calcium recommended.  Continue exercise.  Followed by Dr. Waldemar Dickens, rheumatology.  Looks like a repeat DEXA has already been ordered.  We reviewed the importance of continued mobility and fall precautions going forward I did recommend PT Pilates in town for strengthening.

## 2019-08-06 NOTE — Assessment & Plan Note (Signed)
Strong family history of coronary artery disease.  Personal history of palpitations.  Echo in 2018 reviewed and was essentially normal.  Non-smoker by history of long-term smoke exposure.  We discussed this at length.  Will maximize risk reduction.  Patient will schedule a physical in January and we can check lipids at that time.  In the meantime, will add cardiac CT.  Patient is aware that this is self-pay.

## 2019-08-06 NOTE — Assessment & Plan Note (Signed)
Previous evaluation by sleep medicine.  Thought to be due to psychophysiologic insomnia, however also consistent with TMJ and anxiety.  CBT was recommended as well as follow-up with dentist.

## 2019-08-06 NOTE — Assessment & Plan Note (Signed)
Doing well. Continue current treatment

## 2019-08-18 ENCOUNTER — Other Ambulatory Visit (HOSPITAL_COMMUNITY): Payer: BC Managed Care – PPO

## 2019-08-21 ENCOUNTER — Encounter: Payer: BC Managed Care – PPO | Admitting: Family Medicine

## 2019-09-15 ENCOUNTER — Other Ambulatory Visit (HOSPITAL_COMMUNITY): Payer: BC Managed Care – PPO

## 2019-09-15 ENCOUNTER — Inpatient Hospital Stay: Admission: RE | Admit: 2019-09-15 | Payer: BC Managed Care – PPO | Source: Ambulatory Visit

## 2019-09-15 ENCOUNTER — Encounter: Payer: Self-pay | Admitting: Family Medicine

## 2019-09-15 ENCOUNTER — Other Ambulatory Visit: Payer: Self-pay

## 2019-09-15 ENCOUNTER — Ambulatory Visit (INDEPENDENT_AMBULATORY_CARE_PROVIDER_SITE_OTHER): Payer: BC Managed Care – PPO

## 2019-09-15 DIAGNOSIS — Z23 Encounter for immunization: Secondary | ICD-10-CM | POA: Diagnosis not present

## 2019-09-18 ENCOUNTER — Encounter: Payer: Self-pay | Admitting: Family Medicine

## 2019-09-22 ENCOUNTER — Ambulatory Visit (HOSPITAL_COMMUNITY): Payer: BC Managed Care – PPO | Attending: Cardiology

## 2019-09-22 ENCOUNTER — Other Ambulatory Visit: Payer: Self-pay

## 2019-09-22 ENCOUNTER — Ambulatory Visit (INDEPENDENT_AMBULATORY_CARE_PROVIDER_SITE_OTHER)
Admission: RE | Admit: 2019-09-22 | Discharge: 2019-09-22 | Disposition: A | Discharge status: Home / Self Care | Payer: BC Managed Care – PPO | Source: Ambulatory Visit | Attending: Family Medicine | Admitting: Family Medicine

## 2019-09-22 DIAGNOSIS — Z8249 Family history of ischemic heart disease and other diseases of the circulatory system: Secondary | ICD-10-CM

## 2019-09-22 DIAGNOSIS — Z87898 Personal history of other specified conditions: Secondary | ICD-10-CM | POA: Diagnosis not present

## 2019-09-22 DIAGNOSIS — L245 Irritant contact dermatitis due to other chemical products: Secondary | ICD-10-CM | POA: Diagnosis not present

## 2019-10-20 DIAGNOSIS — Z681 Body mass index (BMI) 19 or less, adult: Secondary | ICD-10-CM | POA: Diagnosis not present

## 2019-10-20 DIAGNOSIS — Z01419 Encounter for gynecological examination (general) (routine) without abnormal findings: Secondary | ICD-10-CM | POA: Diagnosis not present

## 2019-10-20 DIAGNOSIS — Z1231 Encounter for screening mammogram for malignant neoplasm of breast: Secondary | ICD-10-CM | POA: Diagnosis not present

## 2019-10-20 DIAGNOSIS — Z124 Encounter for screening for malignant neoplasm of cervix: Secondary | ICD-10-CM | POA: Diagnosis not present

## 2019-10-20 DIAGNOSIS — Z130 Encounter for screening for diseases of the blood and blood-forming organs and certain disorders involving the immune mechanism: Secondary | ICD-10-CM | POA: Diagnosis not present

## 2019-10-24 NOTE — Progress Notes (Signed)
Office Visit Note  Patient: Christine Ford             Date of Birth: 1961-12-16           MRN: [id number]             PCP: Briscoe Deutscher, DO Referring: Lance Sell, NP Visit Date: 11/06/2019 Occupation: '@GUAROCC'$ @  Subjective:  Neck pain    History of Present Illness: Christine Ford is a 58 y.o. female with history of osteoporosis and DDD.  She is taking a calcium and vitamin D 1,000 units daily.  She denies any recent falls or fractures.   She has been having increased neck pain and right sided radiculopathy for the past several months.  She has noticed weakness in the right upper extremity. She had a MRI of the C-spine on 04/18/18.  She has her first PT visit today.  She has been performing piliates 2-3 times per week.  She has been unable to go to her body pump classes due to the gyms being closed during covid.   Activities of Daily Living:  Patient reports morning stiffness for 30 minutes.   Patient Denies nocturnal pain.  Difficulty dressing/grooming: Denies Difficulty climbing stairs: Denies Difficulty getting out of chair: Denies Difficulty using hands for taps, buttons, cutlery, and/or writing: Reports  Review of Systems  Constitutional: Negative for fatigue.  HENT: Positive for mouth dryness. Negative for mouth sores and nose dryness.   Eyes: Positive for dryness. Negative for itching.  Respiratory: Negative for shortness of breath, wheezing and difficulty breathing.   Cardiovascular: Negative for chest pain and palpitations.  Gastrointestinal: Negative for abdominal pain, blood in stool, constipation and diarrhea.  Endocrine: Negative for increased urination.  Genitourinary: Negative for difficulty urinating and painful urination.  Musculoskeletal: Positive for arthralgias, joint pain, joint swelling and morning stiffness.  Skin: Negative for rash and hair loss.  Allergic/Immunologic: Negative for susceptible to infections.  Neurological: Negative for  dizziness, light-headedness, numbness, headaches, memory loss and weakness.  Hematological: Negative for bruising/bleeding tendency.  Psychiatric/Behavioral: Negative for confusion.    PMFS History:  Patient Active Problem List   Diagnosis Date Noted  . Atrophic vaginitis 08/06/2019  . Other idiopathic scoliosis, lumbar region 09/30/2018  . DDD (degenerative disc disease), cervical 05/06/2018  . Chronic left shoulder pain 05/06/2018  . Postmenopausal 07/29/2017  . History of palpitations with evaluation by cardiology 07/29/2017  . Osteoporosis, followed by Rheumatology Aug 01, 2017  . Family history of heart disease, father died age 67s, MGM age 53s 2017/08/01  . Family history of dementia, mother, age > 55 08-01-2017  . Primary insomnia 09/26/2008  . Anxiety, controlled with Lexapro 09/28/2007  . 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 Arthritis   . Osteopenia   . Temporomandibular joint disorder (TMJ)     Family History  Problem Relation Age of Onset  . Coronary artery disease Mother   . Hypertension Mother   . Osteoporosis Mother   . COPD Mother   . Dementia Mother   . Hypertension Father   . Heart attack Father 41  . Heart disease Father   . Glaucoma Son   . Breast cancer Other   . Lung cancer Other   . Lung cancer Maternal Aunt   . Heart disease Sister   . Lung cancer Cousin   . Breast cancer Cousin    Past Surgical History:  Procedure Laterality Date  . BREAST SURGERY    .  MOLE REMOVAL    . WISDOM TOOTH EXTRACTION     Social History   Social History Narrative   Occupation: Copywriter, advertising, 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  3 sons, ages 65, 27, and 12.  Used to exercise at the [facility] but now walks at lunch for exercise due to COVID restrictions.  Diet with no absolute restrictions but naturally decreases gluten and red meat.  History of palpitations that was worked up and found to be related to stress.  Parents  smoked and so had long secondhand smoke exposure growing up.      Immunization History  Administered Date(s) Administered  . Influenza,inj,Quad PF,6+ Mos 11/22/2018, 09/15/2019  . PPD Test 10/22/2014, 11/05/2014  . Td 12/03/2006  . Tdap 12/19/2016     Objective: Vital Signs: BP 131/73 (BP Location: Left Arm, Patient Position: Sitting, Cuff Size: Normal)   Pulse 70   Resp 12   Ht _0  (1.626 m)   Wt 114 lb (51.7 kg)   BMI 19.57 kg/m    Physical Exam Vitals signs and nursing note reviewed.  Constitutional:      Appearance: She is well-developed.  HENT:     Head: Normocephalic and atraumatic.  Eyes:     Conjunctiva/sclera: Conjunctivae normal.  Neck:     Musculoskeletal: Normal range of motion.  Cardiovascular:     Rate and Rhythm: Normal rate and regular rhythm.     Heart sounds: Normal heart sounds.  Pulmonary:     Effort: Pulmonary effort is normal.     Breath sounds: Normal breath sounds.  Abdominal:     General: Bowel sounds are normal.     Palpations: Abdomen is soft.  Lymphadenopathy:     Cervical: No cervical adenopathy.  Skin:    General: Skin is warm and dry.     Capillary Refill: Capillary refill takes less than 2 seconds.  Neurological:     Mental Status: She is alert and oriented to person, place, and time.  Psychiatric:        Behavior: Behavior normal.      Musculoskeletal Exam: C-spine limited ROM with discomfort.  Thoracic and lumbar spine good ROM.  No midline spinal tenderness.  No SI joint tenderness.  Shoulder joints, elbow joints, wrist joints, MCPs, PIPs, and DIPs good ROM with no synovitis. Complete fist formation bilaterally.  Hip joints, knee joints, ankle joints, MTPs, PIPs, and DIPs good ROM with no synovitis.  No warmth or effusion of knee joints.  No tenderness or swelling of ankle joints.  Tenderness of the right 1st MTP joint.    CDAI Exam: CDAI Score: - Patient Global: -; Provider Global: - Swollen: -; Tender: - Joint Exam   No  joint exam has been documented for this visit   There is currently no information documented on the homunculus. Go to the Rheumatology activity and complete the homunculus joint exam.  Investigation: No additional findings.  Imaging: No results found.  Recent Labs: Lab Results  Component Value Date   WBC 3.4 (L) 02/24/2019   HGB 14.0 02/24/2019   PLT 221.0 02/24/2019   NA 141 02/24/2019   K 4.1 02/24/2019   CL 104 02/24/2019   CO2 30 02/24/2019   GLUCOSE 86 02/24/2019   BUN 18 02/24/2019   CREATININE 0.82 02/24/2019   BILITOT 0.8 02/24/2019   ALKPHOS 47 02/24/2019   AST 23 02/24/2019   ALT 13 02/24/2019   PROT 6.9 02/24/2019   ALBUMIN 4.5  02/24/2019   CALCIUM 9.8 02/24/2019   GFRAA 98 04/04/2018    Speciality Comments: No specialty comments available.  Procedures:  No procedures performed Allergies: Patient has no known allergies.        Assessment / Plan:     Visit Diagnoses: Other osteoporosis without current pathological fracture - DEXA 11/07/18 Left femoral neck -2.6, -0.9% change from previous BMD. Lumbar spine -2.3, -3.1% change in BMD since last DEXA:She is not on any treatment for osteoporosis at this time.  She is taking a calcium and vitamin D supplement.  We discussed trying to obtain 1200 mg of calcium daily and vitamin D 1,000 units daily.  We will check vitamin D today.  She has not had any recent falls or fractures.  We discussed the importance of performing resistive exercises.  She has been going to piliates 2-3 times per week.  Treatment options of osteoporosis were discussed in detail today. She does not want to start on Fosamax at this time.  She was given a handout of information today. She will be due to update DEXA in November 2021.   - Plan: VITAMIN D 25  DDD (degenerative disc disease), cervical: She has limited ROM with discomfort.  She has been experiencing progressive right sided radiculopathy and right upper extremity weakness.  She had a  MRI of the C-spine on 04/19/18, which revealed multilevel degenerative disc disease and facet arthorsis.  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  She is starting physical therapy today.  She has seen Dr. Vertell Limber in the past.  She was advised to follow up with Dr. Vertell Limber if her symptoms persist or worsen.   Other idiopathic scoliosis, lumbar region: She has intermittent discomfort in her lower back.  She has been going to piliates 2-3 times weekly, which has been helping with her discomfort.   Pain in both hands - She has intermittent pain and swelling in both hands.  She has PIP and DIP synovial thickening consistent with mild osteoarthritis.  She has no synovitis on exam.  She declined x-rays of the hands today.  The following lab work will be drawn today. Plan: Ro, La, RF, 14-3-3 eta Protein, CCP, ESR, ANA, Uric acid  Pain in both feet -She has intermittent pain and swelling in both feet. She has tenderness and mild inflammation of the right 1st MTP joint.  She declined x-rays of both feet.  The following lab work will be drawn today. Plan: Ro, La, RF, 14-3-3 eta Protein, CCP, ESR, ANA, Uric acid  Sicca syndrome (HCC) -She has chronic sicca symptoms.  Her mother has history of Sjogren's syndrome.  Ro, La, and ANA will be drawn today.  Plan: Ro, La, ANA  Orders: Orders Placed This Encounter  Procedures  . Ro  . La  . RF  . 14-3-3 eta Protein  . CCP  . VITAMIN D 25  . ESR  . ANA  . Uric acid   No orders of the defined types were placed in this encounter.   Face-to-face time spent with patient was 30 minutes. Greater than 50% of time was spent in counseling and coordination of care.  Follow-Up Instructions: Return in about 1 year (around 11/05/2020) for Osteoporosis, DDD.   Ofilia Neas, PA-C   I examined and evaluated the patient with Hazel Sams PA.  We had detailed discussion with the patient regarding different treatment options for osteoporosis.  Patient took  information on the treatment options.  She is undecided which  medication she will start at this point.  She has been also experiencing some arthralgias.  She states some of the arthralgias happened after she drinks wine.  We will check labs as mentioned above.  She has been also experiencing sicca symptoms and there is family history of Sjogren's.  The plan of care was discussed as noted above.  Bo Merino, MD  Note - This record has been created using Editor, commissioning.  Chart creation errors have been sought, but may not always  have been located. Such creation errors do not reflect on  the standard of medical care.

## 2019-10-28 LAB — HM PAP SMEAR

## 2019-11-03 ENCOUNTER — Ambulatory Visit: Payer: Self-pay | Admitting: Rheumatology

## 2019-11-06 ENCOUNTER — Ambulatory Visit: Payer: BLUE CROSS/BLUE SHIELD | Admitting: Rheumatology

## 2019-11-06 ENCOUNTER — Encounter: Payer: Self-pay | Admitting: Rheumatology

## 2019-11-06 ENCOUNTER — Other Ambulatory Visit: Payer: Self-pay

## 2019-11-06 VITALS — BP 131/73 | HR 70 | Resp 12 | Ht 64.0 in | Wt 114.0 lb

## 2019-11-06 DIAGNOSIS — M4126 Other idiopathic scoliosis, lumbar region: Secondary | ICD-10-CM

## 2019-11-06 DIAGNOSIS — M79671 Pain in right foot: Secondary | ICD-10-CM | POA: Diagnosis not present

## 2019-11-06 DIAGNOSIS — M818 Other osteoporosis without current pathological fracture: Secondary | ICD-10-CM | POA: Diagnosis not present

## 2019-11-06 DIAGNOSIS — M79672 Pain in left foot: Secondary | ICD-10-CM

## 2019-11-06 DIAGNOSIS — M79642 Pain in left hand: Secondary | ICD-10-CM | POA: Diagnosis not present

## 2019-11-06 DIAGNOSIS — M419 Scoliosis, unspecified: Secondary | ICD-10-CM | POA: Diagnosis not present

## 2019-11-06 DIAGNOSIS — M5030 Other cervical disc degeneration, unspecified cervical region: Secondary | ICD-10-CM

## 2019-11-06 DIAGNOSIS — M3500 Sicca syndrome, unspecified: Secondary | ICD-10-CM

## 2019-11-06 DIAGNOSIS — M79641 Pain in right hand: Secondary | ICD-10-CM

## 2019-11-06 NOTE — Progress Notes (Signed)
Pharmacy Note  Subjective: Patient presents today to the [facility] to see Dr. Estanislado Pandy.  Patient seen by pharmacist to discuss different treatment options for osteoporosis. She is taking a calcium and vitamin D 1,000 units daily.  She denies any recent falls or fractures.  Last DEXA on 11/07/18 showed T score of -2.6 left femoral neck -2.6 and -0.9% change from previous BMD. Lumbar spine t score of -2.3 with -3.1% change in BMD since last DEXA.  Objective: T-score: -2.6 at [facility] 11/07/2018  Lab Results  Component Value Date   VD25OH 62.04 02/24/2019   CMP     Component Value Date/Time   NA 141 02/24/2019 0911   K 4.1 02/24/2019 0911   CL 104 02/24/2019 0911   CO2 30 02/24/2019 0911   GLUCOSE 86 02/24/2019 0911   GLUCOSE 80 11/24/2006 0857   BUN 18 02/24/2019 0911   CREATININE 0.82 02/24/2019 0911   CREATININE 0.78 04/04/2018 1003   CALCIUM 9.8 02/24/2019 0911   PROT 6.9 02/24/2019 0911   ALBUMIN 4.5 02/24/2019 0911   AST 23 02/24/2019 0911   ALT 13 02/24/2019 0911   ALKPHOS 47 02/24/2019 0911   BILITOT 0.8 02/24/2019 0911   GFRNONAA 85 04/04/2018 1003   GFRAA 98 04/04/2018 1003   Assessment/Plan: Discussed osteoporosis treatment options including bisphosphonates, Prolia, and PTH analog.  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  Advised patient that she would not be eligible for Forteo or Tymlos at this time.  PTH analog is indicated for severe osteoporosis or who fail treatment with bisphosphonate or Prolia.  Patient verbalized understanding.  Counseled patient that bisphosphonates reduce bone turnover by inhibiting osteoclasts that chew up bone.  Counseled patient on purpose, proper use, and adverse effects of bisphosphonate's.  Reviewed adverse events of  including risk of nausea & diarrhea, headache, and muscle & bone pain.  Reviewed rare adverse effect of osteonecrosis of the jaw which more common in patients  who are on long term treatment  or for the treatment of cancer.  Patient verbalized understanding.  Counseled patient on purpose, proper use, and adverse effects of Prolia.  Counseled patient that Prolia is a medication that must be injected every 6 months by a healthcare professional.  Reviewed the most common adverse effects of Prolia including risk of infection, osteonecrosis of the jaw, rash, and muscle/bone pain. Also advised that Prolia currently only has 10-12 years worth of data and we typically reserve for older patients.  Patient verbalized understanding.   Advised patient to take calcium 1200 mg daily and vitamin D 800 units daily. Patient verbalized understanding.  She is not ready to decide on treatment at this time.  She will notify the office when she has made a decision.  All questions encouraged and answered.  Instructed patient to call with any questions or concerns.  Mariella Saa, PharmD, [place], [place] Clinical Specialty Pharmacist [phone]  11/06/2019 12:09 PM

## 2019-11-09 LAB — URIC ACID: Uric Acid, Serum: 3.7 mg/dL (ref 2.5–7.0)

## 2019-11-09 LAB — SEDIMENTATION RATE: Sed Rate: 2 mm/h (ref 0–30)

## 2019-11-09 LAB — ANA: Anti Nuclear Antibody (ANA): POSITIVE — AB

## 2019-11-09 LAB — ANTI-NUCLEAR AB-TITER (ANA TITER): ANA Titer 1: 1:160 {titer} — ABNORMAL HIGH

## 2019-11-09 LAB — VITAMIN D 25 HYDROXY (VIT D DEFICIENCY, FRACTURES): Vit D, 25-Hydroxy: 44 ng/mL (ref 30–100)

## 2019-11-09 LAB — SJOGRENS SYNDROME-B EXTRACTABLE NUCLEAR ANTIBODY: SSB (La) (ENA) Antibody, IgG: <1 AI

## 2019-11-09 LAB — SJOGRENS SYNDROME-A EXTRACTABLE NUCLEAR ANTIBODY: SSA (Ro) (ENA) Antibody, IgG: <1 AI

## 2019-11-09 LAB — 14-3-3 ETA PROTEIN: 14-3-3 eta Protein: <0.2 ng/mL (ref <0.2)

## 2019-11-09 LAB — RHEUMATOID FACTOR: Rhuematoid fact SerPl-aCnc: <14 IU/mL (ref <14)

## 2019-11-09 LAB — CYCLIC CITRUL PEPTIDE ANTIBODY, IGG: Cyclic Citrullin Peptide Ab: <16 UNITS

## 2019-11-10 NOTE — Progress Notes (Signed)
Ro and La negative.  RF, 14-3-3 eta, and anti-CCP negative.  ESR WNL.   Vitamin D within the desirable range but has trended down slightly. Please advise patient to continue taking a maintenance dose of vitamin D.  Uric acid is WNL  ANA 1:160 nuclear, dense fine speckled pattern.  Low titer-non specific.

## 2019-11-17 DIAGNOSIS — M5030 Other cervical disc degeneration, unspecified cervical region: Secondary | ICD-10-CM | POA: Diagnosis not present

## 2019-11-17 DIAGNOSIS — M419 Scoliosis, unspecified: Secondary | ICD-10-CM | POA: Diagnosis not present

## 2019-11-29 DIAGNOSIS — Z20828 Contact with and (suspected) exposure to other viral communicable diseases: Secondary | ICD-10-CM | POA: Diagnosis not present

## 2020-01-08 DIAGNOSIS — M419 Scoliosis, unspecified: Secondary | ICD-10-CM | POA: Diagnosis not present

## 2020-01-08 DIAGNOSIS — M5030 Other cervical disc degeneration, unspecified cervical region: Secondary | ICD-10-CM | POA: Diagnosis not present

## 2020-01-14 DIAGNOSIS — Z20828 Contact with and (suspected) exposure to other viral communicable diseases: Secondary | ICD-10-CM | POA: Diagnosis not present

## 2020-01-15 ENCOUNTER — Other Ambulatory Visit: Payer: BC Managed Care – PPO

## 2020-01-22 ENCOUNTER — Encounter: Payer: BC Managed Care – PPO | Admitting: Family Medicine

## 2020-02-23 ENCOUNTER — Telehealth: Payer: Self-pay | Admitting: Family Medicine

## 2020-02-23 NOTE — Telephone Encounter (Signed)
My thursdays are now open so maybe you can switch a person's appointment on Monday or Friday so we can get her in?   Orland Mustard, MD [facility]

## 2020-02-23 NOTE — Telephone Encounter (Signed)
Our office has had to cancel patient's TOC appointments 3 times with patient.  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  She would like to be worked in before May if at all possible.  Please advise.

## 2020-02-23 NOTE — Telephone Encounter (Signed)
Called patient and have got her scheduled with Dr.Andy on Monday @ 1pm.

## 2020-02-26 ENCOUNTER — Encounter: Payer: Self-pay | Admitting: Family Medicine

## 2020-02-26 ENCOUNTER — Other Ambulatory Visit: Payer: Self-pay

## 2020-02-26 ENCOUNTER — Ambulatory Visit: Payer: BC Managed Care – PPO | Admitting: Family Medicine

## 2020-02-26 VITALS — BP 120/80 | Temp 97.6°F | Ht 64.0 in | Wt 115.6 lb

## 2020-02-26 DIAGNOSIS — Z0000 Encounter for general adult medical examination without abnormal findings: Secondary | ICD-10-CM | POA: Diagnosis not present

## 2020-02-26 DIAGNOSIS — M5030 Other cervical disc degeneration, unspecified cervical region: Secondary | ICD-10-CM

## 2020-02-26 DIAGNOSIS — F411 Generalized anxiety disorder: Secondary | ICD-10-CM | POA: Diagnosis not present

## 2020-02-26 DIAGNOSIS — Z8249 Family history of ischemic heart disease and other diseases of the circulatory system: Secondary | ICD-10-CM | POA: Diagnosis not present

## 2020-02-26 DIAGNOSIS — M818 Other osteoporosis without current pathological fracture: Secondary | ICD-10-CM | POA: Diagnosis not present

## 2020-02-26 DIAGNOSIS — F5101 Primary insomnia: Secondary | ICD-10-CM

## 2020-02-26 DIAGNOSIS — B182 Chronic viral hepatitis C: Secondary | ICD-10-CM | POA: Diagnosis not present

## 2020-02-26 LAB — COMPREHENSIVE METABOLIC PANEL
ALT: 15 U/L (ref 0–35)
AST: 23 U/L (ref 0–37)
Albumin: 4.3 g/dL (ref 3.5–5.2)
Alkaline Phosphatase: 52 U/L (ref 39–117)
BUN: 17 mg/dL (ref 6–23)
CO2: 34 mEq/L — ABNORMAL HIGH (ref 19–32)
Calcium: 10 mg/dL (ref 8.4–10.5)
Chloride: 100 mEq/L (ref 96–112)
Creatinine, Ser: 0.79 mg/dL (ref 0.40–1.20)
GFR: 74.6 mL/min (ref >60.00)
Glucose, Bld: 78 mg/dL (ref 70–99)
Potassium: 4.2 mEq/L (ref 3.5–5.1)
Sodium: 139 mEq/L (ref 135–145)
Total Bilirubin: 0.6 mg/dL (ref 0.2–1.2)
Total Protein: 7 g/dL (ref 6.0–8.3)

## 2020-02-26 LAB — CBC WITH DIFFERENTIAL/PLATELET
Basophils Absolute: 0 10*3/uL (ref 0.0–0.1)
Basophils Relative: 0.6 % (ref 0.0–3.0)
Eosinophils Absolute: 0.1 10*3/uL (ref 0.0–0.7)
Eosinophils Relative: 1.4 % (ref 0.0–5.0)
HCT: 37.9 % (ref 36.0–46.0)
Hemoglobin: 13.1 g/dL (ref 12.0–15.0)
Lymphocytes Relative: 32.5 % (ref 12.0–46.0)
Lymphs Abs: 1.7 10*3/uL (ref 0.7–4.0)
MCHC: 34.5 g/dL (ref 30.0–36.0)
MCV: 94.2 fl (ref 78.0–100.0)
Monocytes Absolute: 0.5 10*3/uL (ref 0.1–1.0)
Monocytes Relative: 10.5 % (ref 3.0–12.0)
Neutro Abs: 2.9 10*3/uL (ref 1.4–7.7)
Neutrophils Relative %: 55 % (ref 43.0–77.0)
Platelets: 240 10*3/uL (ref 150.0–400.0)
RBC: 4.03 Mil/uL (ref 3.87–5.11)
RDW: 13.9 % (ref 11.5–15.5)
WBC: 5.2 10*3/uL (ref 4.0–10.5)

## 2020-02-26 LAB — LIPID PANEL
Cholesterol: 183 mg/dL (ref 0–200)
HDL: 68.5 mg/dL (ref >39.00)
LDL Cholesterol: 98 mg/dL (ref 0–99)
NonHDL: 114.72
Total CHOL/HDL Ratio: 3
Triglycerides: 82 mg/dL (ref 0.0–149.0)
VLDL: 16.4 mg/dL (ref 0.0–40.0)

## 2020-02-26 LAB — VITAMIN D 25 HYDROXY (VIT D DEFICIENCY, FRACTURES): VITD: 50.75 ng/mL (ref 30.00–100.00)

## 2020-02-26 LAB — TSH: TSH: 2.22 u[IU]/mL (ref 0.35–4.50)

## 2020-02-26 NOTE — Patient Instructions (Signed)
Please return in 12 months for your annual complete physical; please come fasting.  I will release your lab results to you on your MyChart account with further instructions. Please reply with any questions.    It was a pleasure meeting you today! Thank you for choosing [place] to meet your healthcare needs! I truly look forward to working with you. If you have any questions or concerns, please send me a message via Mychart or call the office at [phone].   Preventive Care 20-64 Years Old, Female Preventive care refers to visits with your health care provider and lifestyle choices that can promote health and wellness. This includes:  A yearly physical exam. This may also be called an annual well check.  Regular dental visits and eye exams.  Immunizations.  Screening for certain conditions.  Healthy lifestyle choices, such as eating a healthy diet, getting regular exercise, not using drugs or products that contain nicotine and tobacco, and limiting alcohol use. What can I expect for my preventive care visit? Physical exam Your health care provider will check your:  Height and weight. This may be used to calculate body mass index (BMI), which tells if you are at a healthy weight.  Heart rate and blood pressure.  Skin for abnormal spots. Counseling Your health care provider may ask you questions about your:  Alcohol, tobacco, and drug use.  Emotional well-being.  Home and relationship well-being.  Sexual activity.  Eating habits.  Work and work Statistician.  Method of birth control.  Menstrual cycle.  Pregnancy history. What immunizations do I need?  Influenza (flu) vaccine  This is recommended every year. Tetanus, diphtheria, and pertussis (Tdap) vaccine  You may need a Td booster every 10 years. Varicella (chickenpox) vaccine  You may need this if you have not been vaccinated. Zoster (shingles) vaccine  You may need this after age 73. Measles, mumps, and  rubella (MMR) vaccine  You may need at least one dose of MMR if you were born in 1957 or later. You may also need a second dose. Pneumococcal conjugate (PCV13) vaccine  You may need this if you have certain conditions and were not previously vaccinated. Pneumococcal polysaccharide (PPSV23) vaccine  You may need one or two doses if you smoke cigarettes or if you have certain conditions. Meningococcal conjugate (MenACWY) vaccine  You may need this if you have certain conditions. Hepatitis A vaccine  You may need this if you have certain conditions or if you travel or work in places where you may be exposed to hepatitis A. Hepatitis B vaccine  You may need this if you have certain conditions or if you travel or work in places where you may be exposed to hepatitis B. Haemophilus influenzae type b (Hib) vaccine  You may need this if you have certain conditions. Human papillomavirus (HPV) vaccine  If recommended by your health care provider, you may need three doses over 6 months. You may receive vaccines as individual doses or as more than one vaccine together in one shot (combination vaccines). Talk with your health care provider about the risks and benefits of combination vaccines. What tests do I need? Blood tests  Lipid and cholesterol levels. These may be checked every 5 years, or more frequently if you are over 37 years old.  Hepatitis C test.  Hepatitis B test. Screening  Lung cancer screening. You may have this screening every year starting at age 48 if you have a 30-pack-year history of smoking and currently smoke or  have quit within the past 15 years.  Colorectal cancer screening. All adults should have this screening starting at age 35 and continuing until age 66. Your health care provider may recommend screening at age 53 if you are at increased risk. You will have tests every 1-10 years, depending on your results and the type of screening test.  Diabetes screening. This  is done by checking your blood sugar (glucose) after you have not eaten for a while (fasting). You may have this done every 1-3 years.  Mammogram. This may be done every 1-2 years. Talk with your health care provider about when you should start having regular mammograms. This may depend on whether you have a family history of breast cancer.  BRCA-related cancer screening. This may be done if you have a family history of breast, ovarian, tubal, or peritoneal cancers.  Pelvic exam and Pap test. This may be done every 3 years starting at age 51. Starting at age 71, this may be done every 5 years if you have a Pap test in combination with an HPV test. Other tests  Sexually transmitted disease (STD) testing.  Bone density scan. This is done to screen for osteoporosis. You may have this scan if you are at high risk for osteoporosis. Follow these instructions at home: Eating and drinking  Eat a diet that includes fresh fruits and vegetables, whole grains, lean protein, and low-fat dairy.  Take vitamin and mineral supplements as recommended by your health care provider.  Do not drink alcohol if: ? Your health care provider tells you not to drink. ? You are pregnant, may be pregnant, or are planning to become pregnant.  If you drink alcohol: ? Limit how much you have to 0-1 drink a day. ? Be aware of how much alcohol is in your drink. In the [place] one drink equals one 12 oz bottle of beer (355 mL), one 5 oz glass of wine (148 mL), or one 1 oz glass of hard liquor (44 mL). Lifestyle  Take daily care of your teeth and gums.  Stay active. Exercise for at least 30 minutes on 5 or more days each week.  Do not use any products that contain nicotine or tobacco, such as cigarettes, e-cigarettes, and chewing tobacco. If you need help quitting, ask your health care provider.  If you are sexually active, practice safe sex. Use a condom or other form of birth control (contraception) in order to prevent  pregnancy and STIs (sexually transmitted infections).  If told by your health care provider, take low-dose aspirin daily starting at age 61. What's next?  Visit your health care provider once a year for a well check visit.  Ask your health care provider how often you should have your eyes and teeth checked.  Stay up to date on all vaccines. This information is not intended to replace advice given to you by your health care provider. Make sure you discuss any questions you have with your health care provider. Document Revised: 08/25/2018 Document Reviewed: 08/25/2018 Elsevier Patient Education  2020 [employer].

## 2020-02-26 NOTE — Progress Notes (Signed)
Subjective  Chief Complaint  Patient presents with  . Transitions Of Care    from [place]   . Anxiety    on lexapro   . Osteoporosis    would like to talk about options for treatment     HPI: Christine Ford is a 59 y.o. female who presents to [facility] [place] [facility] today for a Female Wellness Visit. She also has the concerns and/or needs as listed above in the chief complaint. These will be addressed in addition to the Health Maintenance Visit.  I reviewed chart and prior pcp notes/labs and rheum evals. Echocardiogram reports and cards evaluation.  Wellness Visit: annual visit with health maintenance review and exam without Pap   HM: saw Gyn for female wellness visit last month; reports nl mammogram at that time. Lives healthy lifestyle.   Has osteoporosis: monitoring and going to go to [facility]. Dr. Corliss Skains is managing. I reviewed her most recent notes and labs Chronic disease f/u and/or acute problem visit: (deemed necessary to be done in addition to the wellness visit):  GAD: well controlled x 1 year on lexapro. No AEs.   Premature FH of CAD. Eats healthy. No h/o htn, HLD or diabetes. Nonsmoking. Nl echo 2018.   Insomnia is stable.   Social: married, 3 sons, youngest is 47. parttime Armed forces operational officer.   Assessment  1. Annual physical exam   2. GAD (generalized anxiety disorder)   3. Other osteoporosis without current pathological fracture   4. DDD (degenerative disc disease), cervical   5. Primary insomnia   6. Family history of premature CAD      Plan  Female Wellness Visit:  Age appropriate Health Maintenance and Prevention measures were discussed with patient. Included topics are cancer screening recommendations, ways to keep healthy (see AVS) including dietary and exercise recommendations, regular eye and dental care, use of seat belts, and avoidance of moderate alcohol use and tobacco use. Screens up to date  BMI: discussed patient's BMI and  encouraged positive lifestyle modifications to help get to or maintain a target BMI.  HM needs and immunizations were addressed and ordered. See below for orders. See HM and immunization section for updates. UTD  Routine labs and screening tests ordered including cmp, cbc and lipids where appropriate.  Discussed recommendations regarding Vit D and calcium supplementation (see AVS)  Chronic disease management visit and/or acute problem visit:  Osteoporosis on surveillance w/o meds at this time. Check vit D. rec weight bearing activity.   GAD is well controlled. Continue lexapro at current dosing.   nonfasting lab work today.   Follow up: 12 months for cpe.   Orders Placed This Encounter  Procedures  . HIV Antibody (routine testing w rflx)  . CBC with Differential/Platelet  . Comprehensive metabolic panel  . Lipid panel  . TSH  . VITAMIN D 25 Hydroxy (Vit-D Deficiency, Fractures)   No orders of the defined types were placed in this encounter.     Lifestyle: Body mass index is 19.84 kg/m. Wt Readings from Last 3 Encounters:  02/26/20 115 lb 9.6 oz (52.4 kg)  11/06/19 114 lb (51.7 kg)  07/28/19 113 lb (51.3 kg)    Patient Active Problem List   Diagnosis Date Noted  . DDD (degenerative disc disease), cervical 05/06/2018    Priority: High    Multilevel spondylosis was noted with C4-5, C5-6, C6-7 narrowing.  Anterior spurring and posterior spurring was noted. Facet joint arthropathy was noted.     Marland Kitchen  Osteoporosis 07/28/2017    Priority: High  . Primary insomnia 09/26/2008    Priority: High    Previous evaluation by sleep medicine.  Thought to be due to psychophysiologic insomnia, however also consistent with TMJ and anxiety.  CBT was recommended as well as follow-up with dentist.   . GAD (generalized anxiety disorder) 09/28/2007    Priority: High  . Chronic left shoulder pain 05/06/2018    Priority: Medium  . Family history of premature CAD 07/28/2017    Priority:  Medium    Dad passed age    . Atrophic vaginitis 08/06/2019    Priority: Low  . Other idiopathic scoliosis, lumbar region 09/30/2018    Priority: Low  . History of palpitations 07/29/2017    Priority: Low    Negative cardiac evaluation: Normal 2018 ECHO    . 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 HIV Screening  07/31/1976  . MAMMOGRAM  12/28/2020  . COLONOSCOPY  04/11/2024  . PAP SMEAR-Modifier  10/19/2024  . TETANUS/TDAP  12/19/2026  . INFLUENZA VACCINE  Completed  . Hepatitis C Screening  Completed   Immunization History  Administered Date(s) Administered  . Influenza,inj,Quad PF,6+ Mos 11/22/2018, 09/15/2019  . PPD Test 10/22/2014, 11/05/2014  . Td 12/03/2006  . Tdap 12/19/2016   We updated and reviewed the patient's past history in detail and it is documented below. Allergies: Patient has No Known Allergies. Past Medical History Patient  has a past medical history of Anxiety, Arthritis, Family history of dementia, mother, age > 68 (07/28/2017), Osteopenia, and Temporomandibular joint disorder (TMJ). Past Surgical History Patient  has a past surgical history that includes Breast surgery and Wisdom tooth extraction. Family History: Patient family history includes Alzheimer's disease in her mother; Breast cancer in her cousin and another family member; COPD in her mother; Coronary artery disease (age of onset: 33) in her mother; Glaucoma in her son; Healthy in her son and son; Heart attack (age of onset: 48) in her father; Heart disease in her father and sister; Hypertension in her father and mother; Lung cancer in her cousin, maternal aunt, and another family member; Osteoporosis in her mother. Social History:  Patient  reports that she has never smoked. She has never used smokeless tobacco. She reports current alcohol use. She reports that she does not use drugs.  Review of Systems: Constitutional: negative for fever or  malaise Ophthalmic: negative for photophobia, double vision or loss of vision Cardiovascular: negative for chest pain, dyspnea on exertion, or new LE swelling Respiratory: negative for SOB or persistent cough Gastrointestinal: negative for abdominal pain, change in bowel habits or melena Genitourinary: negative for dysuria or gross hematuria, no abnormal uterine bleeding or disharge Musculoskeletal: negative for new gait disturbance or muscular weakness Integumentary: negative for new or persistent rashes, no breast lumps Neurological: negative for TIA or stroke symptoms Psychiatric: negative for SI or delusions Allergic/Immunologic: negative for hives  Patient Care Team    Relationship Specialty Notifications Start End  Leamon Arnt, MD PCP - General Family Medicine  02/26/20   Paula Compton, MD Consulting Physician Obstetrics and Gynecology  11/23/11   Bo Merino, MD Consulting Physician Rheumatology  07/28/19     Objective  Vitals: BP 120/80   Temp 97.6 F (36.4 C) (Temporal)   Ht 5\' 4"  (1.626 m)   Wt 115 lb 9.6 oz (52.4 kg)   BMI 19.84 kg/m  General:  Well developed, well nourished,  no acute distress  Psych:  Alert and orientedx3,normal mood and affect HEENT:  Normocephalic, atraumatic, non-icteric sclera, PERRL,supple neck without adenopathy, mass or thyromegaly Cardiovascular:  Normal S1, S2, RRR without gallop, rub or murmur,  Respiratory:  Good breath sounds bilaterally, CTAB with normal respiratory effort Gastrointestinal: normal bowel sounds, soft, non-tender, no noted masses. No HSM MSK: no deformities, contusions. Joints are without erythema or swelling. Spine and CVA region are nontender Skin:  Warm, no rashes or suspicious lesions noted Neurologic:    Mental status is normal. Gross motor and sensory exams are normal. Normal gait. No tremor    Commons side effects, risks, benefits, and alternatives for medications and treatment plan prescribed today were  discussed, and the patient expressed understanding of the given instructions. Patient is instructed to call or message via MyChart if he/she has any questions or concerns regarding our treatment plan. No barriers to understanding were identified. We discussed Red Flag symptoms and signs in detail. Patient expressed understanding regarding what to do in case of urgent or emergency type symptoms.   Medication list was reconciled, printed and provided to the patient in AVS. Patient instructions and summary information was reviewed with the patient as documented in the AVS. This note was prepared with assistance of Dragon voice recognition software. Occasional wrong-word or sound-a-like substitutions may have occurred due to the inherent limitations of voice recognition software  This visit occurred during the SARS-CoV-2 public health emergency.  Safety protocols were in place, including screening questions prior to the visit, additional usage of staff PPE, and extensive cleaning of exam room while observing appropriate contact time as indicated for disinfecting solutions.

## 2020-02-27 LAB — HEPATITIS C ANTIBODY
Hepatitis C Ab: NONREACTIVE
SIGNAL TO CUT-OFF: 0.03 (ref <1.00)

## 2020-02-27 LAB — HIV ANTIBODY (ROUTINE TESTING W REFLEX): HIV 1&2 Ab, 4th Generation: NONREACTIVE

## 2020-03-11 ENCOUNTER — Encounter: Payer: BC Managed Care – PPO | Admitting: Family Medicine

## 2020-07-15 DIAGNOSIS — M419 Scoliosis, unspecified: Secondary | ICD-10-CM | POA: Diagnosis not present

## 2020-07-15 DIAGNOSIS — M5030 Other cervical disc degeneration, unspecified cervical region: Secondary | ICD-10-CM | POA: Diagnosis not present

## 2020-08-14 ENCOUNTER — Other Ambulatory Visit: Payer: Self-pay | Admitting: Family Medicine

## 2020-08-14 DIAGNOSIS — F419 Anxiety disorder, unspecified: Secondary | ICD-10-CM

## 2020-09-20 ENCOUNTER — Ambulatory Visit: Payer: BC Managed Care – PPO | Admitting: Family Medicine

## 2020-09-20 ENCOUNTER — Other Ambulatory Visit: Payer: Self-pay

## 2020-09-20 ENCOUNTER — Encounter: Payer: Self-pay | Admitting: Family Medicine

## 2020-09-20 VITALS — BP 120/80 | HR 69 | Temp 98.9°F | Wt 116.2 lb

## 2020-09-20 DIAGNOSIS — R109 Unspecified abdominal pain: Secondary | ICD-10-CM

## 2020-09-20 DIAGNOSIS — R300 Dysuria: Secondary | ICD-10-CM | POA: Diagnosis not present

## 2020-09-20 DIAGNOSIS — R81 Glycosuria: Secondary | ICD-10-CM | POA: Diagnosis not present

## 2020-09-20 DIAGNOSIS — T50Z95A Adverse effect of other vaccines and biological substances, initial encounter: Secondary | ICD-10-CM | POA: Diagnosis not present

## 2020-09-20 LAB — POCT URINALYSIS DIPSTICK
Bilirubin, UA: NEGATIVE
Blood, UA: NEGATIVE
Glucose, UA: POSITIVE — AB
Ketones, UA: NEGATIVE
Leukocytes, UA: NEGATIVE
Nitrite, UA: NEGATIVE
Protein, UA: NEGATIVE
Spec Grav, UA: 1.01 (ref 1.010–1.025)
Urobilinogen, UA: 0.2 E.U./dL
pH, UA: 6 (ref 5.0–8.0)

## 2020-09-20 LAB — POCT GLYCOSYLATED HEMOGLOBIN (HGB A1C): Hemoglobin A1C: 5 % (ref 4.0–5.6)

## 2020-09-20 NOTE — Patient Instructions (Addendum)
Please return in jan - march 2022 for your annual complete physical; please come fasting.  I believe your symptoms were related to the flu vaccination - side effects of the vaccine that mimic a viral syndrome are not too uncommon, although your fatigue lasted longer than most. Keep an eye on yourself. You should continue to feel back to your self over the next week. Return if you don't.   You do not have a bladder or kidney infection.  Stay hydrate, eat and rest.   If you have any questions or concerns, please don't hesitate to send me a message via MyChart or call the office at [phone]. Thank you for visiting with [place] today! It's our pleasure caring for you.

## 2020-09-20 NOTE — Progress Notes (Signed)
Subjective   CC:  Chief Complaint  Patient presents with   Cystitis    symptoms started 9/19, lower back pain, fatigue    HPI: Christine Ford is a 59 y.o. female who presents to the office today to address the problems listed above in the chief complaint. Patient reports she started to feel badly about 5 days ago with mild malaise.  Symptoms progressed to significant lower back pain" kidney".  She also has significant fatigue.  Ms. Herbie Baltimore go to work today and she could not complete an exercise routine that she does often.  She thought maybe she had a kidney infection.  Yesterday today she is feeling much better.  She denies fevers, URI symptoms, sore throat, cough, shortness of breath, chest pain, nausea, vomiting, diarrhea, rash, arthralgias.  She has had Covid earlier this year and also has been fully vaccinated.  She has no known exposures.  Of note, she received the influenza vaccination 2 to 3 days prior to her symptoms.  Assessment    1. Vaccination side effects, initial encounter   2. Dysuria   3. Flank pain   4. Glucose found in urine on examination      Plan   Suspect vaccination side effects, reaction: Reassured improving.  No signs or symptoms of significant infection.  Will monitor symptoms over the weekend and follow-up if she is not improving.  Advil if needed hydration and rest.  Follow up: March for complete physical Orders Placed This Encounter  Procedures   Urine Culture   POCT Urinalysis Dipstick   POCT HgB A1C   No orders of the defined types were placed in this encounter.     I reviewed the patients updated PMH, FH, and SocHx.    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      Review of Systems: Cardiovascular: negative for chest pain Respiratory: negative for SOB or persistent cough Gastrointestinal: negative for abdominal pain Constitutional: Negative for fever malaise or anorexia  Objective  Vitals: BP 120/80    Pulse 69    Temp 98.9 F (37.2 C) (Temporal)    Wt 116 lb 3.2 oz (52.7 kg)    SpO2 98%    BMI 19.95 kg/m  General: no acute distress  Psych:  Alert and oriented, normal mood and affect Cardiovascular:  RRR without murmur or gallop. no peripheral edema Respiratory:  Good breath sounds bilaterally, CTAB with normal respiratory effort Gastrointestinal: soft, flat abdomen, normal active bowel sounds, no palpable masses, no hepatosplenomegaly, no appreciated hernias, NO CVAT, no suprapubic,  Back: Nontender Skin:  Warm, no rashes Neurologic:   Mental status is normal. normal gait Office Visit  on 09/20/2020  Component Date Value Ref Range Status   Color, UA 09/20/2020 yellow   Final   Clarity, UA 09/20/2020 clear   Final   Glucose, UA 09/20/2020 Positive* Negative Final   Bilirubin, UA 09/20/2020 negative   Final   Ketones, UA 09/20/2020 negative   Final   Spec Grav, UA 09/20/2020 1.010  1.010 - 1.025 Final   Blood, UA 09/20/2020 negative   Final   pH, UA 09/20/2020 6.0  5.0 - 8.0 Final   Protein, UA 09/20/2020 Negative  Negative Final   Urobilinogen, UA 09/20/2020 0.2  0.2 or 1.0 E.U./dL Final   Nitrite, UA 59/56/3875 negative   Final    Leukocytes, UA 09/20/2020 Negative  Negative Final   Hemoglobin A1C 09/20/2020 5.0  4.0 - 5.6 % Final    Commons side effects, risks, benefits, and alternatives for medications and treatment plan prescribed today were discussed, and the patient expressed understanding of the given instructions. Patient is instructed to call or message via MyChart if he/she has any questions or concerns regarding our treatment plan. No barriers to understanding were identified. We discussed Red Flag symptoms and signs in detail. Patient expressed understanding regarding what to do in case of urgent or emergency type symptoms.   Medication list was reconciled, printed and provided to the patient in AVS. Patient instructions and summary information was reviewed with the patient as documented in the AVS. This note was prepared with assistance of Dragon voice recognition software. Occasional wrong-word or sound-a-like substitutions may have occurred due to the inherent limitations of voice recognition software

## 2020-09-22 LAB — URINE CULTURE
MICRO NUMBER:: 10993457
Result:: NO GROWTH
SPECIMEN QUALITY:: ADEQUATE

## 2020-09-23 ENCOUNTER — Encounter: Payer: Self-pay | Admitting: Family Medicine

## 2020-10-14 ENCOUNTER — Ambulatory Visit: Payer: BC Managed Care – PPO | Admitting: Family Medicine

## 2020-10-25 DIAGNOSIS — Z1231 Encounter for screening mammogram for malignant neoplasm of breast: Secondary | ICD-10-CM | POA: Diagnosis not present

## 2020-11-01 DIAGNOSIS — H938X1 Other specified disorders of right ear: Secondary | ICD-10-CM | POA: Diagnosis not present

## 2020-11-01 DIAGNOSIS — L309 Dermatitis, unspecified: Secondary | ICD-10-CM | POA: Diagnosis not present

## 2020-11-01 DIAGNOSIS — H608X3 Other otitis externa, bilateral: Secondary | ICD-10-CM | POA: Insufficient documentation

## 2020-11-01 DIAGNOSIS — R29898 Other symptoms and signs involving the musculoskeletal system: Secondary | ICD-10-CM | POA: Diagnosis not present

## 2020-11-01 DIAGNOSIS — H9201 Otalgia, right ear: Secondary | ICD-10-CM | POA: Diagnosis not present

## 2020-11-01 DIAGNOSIS — M26641 Arthritis of right temporomandibular joint: Secondary | ICD-10-CM | POA: Diagnosis not present

## 2020-12-02 NOTE — Progress Notes (Deleted)
Office Visit Note  Patient: Christine Ford             Date of Birth: 1961/09/24           MRN: [id number]             PCP: Willow Ora, MD Referring: Willow Ora, MD Visit Date: 12/13/2020 Occupation: @GUAROCC @  Subjective:  No chief complaint on file.   History of Present Illness: Christine Ford is a 59 y.o. female ***   Activities of Daily Living:  Patient reports morning stiffness for *** {minute/hour:19697}.   Patient {ACTIONS;DENIES/REPORTS:21021675::"Denies"} nocturnal pain.  Difficulty dressing/grooming: {ACTIONS;DENIES/REPORTS:21021675::"Denies"} Difficulty climbing stairs: {ACTIONS;DENIES/REPORTS:21021675::"Denies"} Difficulty getting out of chair: {ACTIONS;DENIES/REPORTS:21021675::"Denies"} Difficulty using hands for taps, buttons, cutlery, and/or writing: {ACTIONS;DENIES/REPORTS:21021675::"Denies"}  No Rheumatology ROS completed.   PMFS History:  Patient Active Problem List   Diagnosis Date Noted  . Other idiopathic scoliosis, lumbar region 09/30/2018  . DDD (degenerative disc disease), cervical 05/06/2018  . Chronic left shoulder pain 05/06/2018  . History of palpitations 07/29/2017  . Osteoporosis 07/28/2017  . Family history of premature CAD 07/28/2017  . Primary insomnia 09/26/2008  . GAD (generalized anxiety disorder) 09/28/2007  . 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 Anxiety   . Arthritis   . Family history of dementia, mother, age > 60 07/28/2017  . Osteopenia   . Temporomandibular joint disorder (TMJ)     Family History  Problem Relation Age of Onset  . Coronary artery disease Mother 44  . Hypertension Mother   . Osteoporosis Mother   . COPD Mother   . Alzheimer's disease Mother   . Hypertension Father   . Heart attack Father 78  . Heart disease Father   . Glaucoma Son   . Healthy Son   . Breast cancer Other   . Lung cancer Other   . Lung cancer Maternal Aunt   . Heart disease Sister   . Healthy  Son   . Lung cancer Cousin   . Breast cancer Cousin    Past Surgical History:  Procedure Laterality Date  . BREAST SURGERY    . WISDOM TOOTH EXTRACTION     Social History   Social History Narrative   Occupation: 57, 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  3 sons, ages 47, 20, and 42.  Used to exercise at the [facility] but now walks at lunch for exercise due to COVID restrictions.  Diet with no absolute restrictions but naturally decreases gluten and red meat.  History of palpitations that was worked up and found to be related to stress.  Parents smoked and so had long secondhand smoke exposure growing up.      Immunization History  Administered Date(s) Administered  . Influenza,inj,Quad PF,6+ Mos 11/22/2018, 09/15/2019  . PPD Test 10/22/2014, 11/05/2014  . Td 12/03/2006  . Tdap 12/19/2016     Objective: Vital Signs: There were no vitals taken for this visit.   Physical Exam   Musculoskeletal Exam: ***  CDAI Exam: CDAI Score: - Patient Global: -; Provider Global: - Swollen: -; Tender: - Joint Exam 12/13/2020   No joint exam has been documented for this visit   There is currently no information documented on the homunculus. Go to the Rheumatology activity and complete the homunculus joint exam.  Investigation: No additional findings.  Imaging: No results found.  Recent Labs: Lab Results  Component Value Date   WBC 5.2 02/26/2020  HGB 13.1 02/26/2020   PLT 240.0 02/26/2020   NA 139 02/26/2020   K 4.2 02/26/2020   CL 100 02/26/2020   CO2 34 (H) 02/26/2020   GLUCOSE 78 02/26/2020   BUN 17 02/26/2020   CREATININE 0.79 02/26/2020   BILITOT 0.6 02/26/2020   ALKPHOS 52 02/26/2020   AST 23 02/26/2020   ALT 15 02/26/2020   PROT 7.0 02/26/2020   ALBUMIN 4.3 02/26/2020   CALCIUM 10.0 02/26/2020   GFRAA 98 04/04/2018    Speciality Comments: No specialty comments available.  Procedures:  No procedures performed Allergies: Patient has no  known allergies.   Assessment / Plan:     Visit Diagnoses: No diagnosis found.  Orders: No orders of the defined types were placed in this encounter.  No orders of the defined types were placed in this encounter.   Face-to-face time spent with patient was *** minutes. Greater than 50% of time was spent in counseling and coordination of care.  Follow-Up Instructions: No follow-ups on file.   Ellen Henri, CMA  Note - This record has been created using Animal nutritionist.  Chart creation errors have been sought, but may not always  have been located. Such creation errors do not reflect on  the standard of medical care.

## 2020-12-13 ENCOUNTER — Ambulatory Visit: Payer: BC Managed Care – PPO | Admitting: Rheumatology

## 2020-12-13 DIAGNOSIS — M3500 Sicca syndrome, unspecified: Secondary | ICD-10-CM

## 2020-12-13 DIAGNOSIS — M818 Other osteoporosis without current pathological fracture: Secondary | ICD-10-CM

## 2020-12-13 DIAGNOSIS — M79671 Pain in right foot: Secondary | ICD-10-CM

## 2020-12-13 DIAGNOSIS — M79641 Pain in right hand: Secondary | ICD-10-CM

## 2020-12-13 DIAGNOSIS — M4126 Other idiopathic scoliosis, lumbar region: Secondary | ICD-10-CM

## 2020-12-13 DIAGNOSIS — M5030 Other cervical disc degeneration, unspecified cervical region: Secondary | ICD-10-CM

## 2020-12-28 HISTORY — PX: OTHER SURGICAL HISTORY: SHX169

## 2021-01-14 NOTE — Progress Notes (Signed)
Office Visit Note  Patient: Christine Ford             Date of Birth: 12/13/1961           MRN: [id number]             PCP: Patient, No Pcp Per Referring: Willow Ora, MD Visit Date: 01/27/2021 Occupation: @GUAROCC @  Subjective:  Neck pain, dry mouth and dry eyes   History of Present Illness: Gerrie Castiglia is a 60 y.o. female with history of sicca syndrome, degenerative disc disease and osteoporosis.  She states she has been taking calcium and vitamin D on a regular basis.  She has been also exercising.  We will schedule repeat DEXA scan.  She has been experiencing pain in her cervical spine.  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  She complains of trapezius spasm.  She also has some lower back pain.  She states her cervical pain flared about 2 weeks ago and was radiating to her left trapezius which has eased off now.  She has an appointment coming up with physical therapy.  She continues to have dry mouth and dry eyes.  She states the symptoms are manageable with over-the-counter products.  She denies any history of oral ulcers, nasal ulcers, malar rash, photosensitivity, inflammatory arthritis, Raynaud's phenomenon or lymphadenopathy.  Activities of Daily Living:  Patient reports morning stiffness for 1-2  hours.   Patient Reports nocturnal pain.  Difficulty dressing/grooming: Denies Difficulty climbing stairs: Denies Difficulty getting out of chair: Denies Difficulty using hands for taps, buttons, cutlery, and/or writing: Denies  Review of Systems  Constitutional: Negative for fatigue.  HENT: Positive for mouth dryness and nose dryness. Negative for mouth sores.   Eyes: Positive for dryness. Negative for pain and itching.  Respiratory: Negative for shortness of breath and difficulty breathing.   Cardiovascular: Negative for chest pain, palpitations and swelling in legs/feet.  Gastrointestinal: Negative for blood in stool, constipation  and diarrhea.  Endocrine: Negative for increased urination.  Genitourinary: Negative for difficulty urinating.  Musculoskeletal: Positive for arthralgias, joint pain and morning stiffness. Negative for joint swelling, myalgias, muscle tenderness and myalgias.  Skin: Negative for color change, rash and redness.  Allergic/Immunologic: Negative for susceptible to infections.  Neurological: Negative for dizziness, numbness, headaches, memory loss and weakness.  Hematological: Negative for bruising/bleeding tendency.  Psychiatric/Behavioral: Negative for confusion. The patient is nervous/anxious.     PMFS History:  Patient Active Problem List   Diagnosis Date Noted  . Sicca syndrome (HCC) 01/27/2021  . Other idiopathic scoliosis, lumbar region 09/30/2018  . DDD (degenerative disc disease), cervical 05/06/2018  . Chronic left shoulder pain 05/06/2018  . History of palpitations 07/29/2017  . Osteoporosis 07/28/2017  . Family history of premature CAD 07/28/2017  . Primary insomnia 09/26/2008  . GAD (generalized anxiety disorder) 09/28/2007  . 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 Anxiety   . Arthritis   . Family history of dementia, mother, age > 42 07/28/2017  . Osteopenia   . Temporomandibular joint disorder (TMJ)     Family History  Problem Relation Age of Onset  . Coronary artery disease Mother 88  . Hypertension Mother   . Osteoporosis Mother   . COPD Mother   . Alzheimer's disease Mother   . Hypertension Father   . Heart attack Father 66  . Heart disease Father   . Glaucoma Son   .  Healthy Son   . Breast cancer Other   . Lung cancer Other   . Lung cancer Maternal Aunt   . Heart disease Sister   . Healthy Son   . Lung cancer Cousin   . Breast cancer Cousin    Past Surgical History:  Procedure Laterality Date  . BREAST SURGERY    . 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  3 sons, ages 67, 62, and 66.  Used to exercise at the [facility] but now walks at lunch for exercise due to COVID restrictions.  Diet with no absolute restrictions but naturally decreases gluten and red meat.  History of palpitations that was worked up and found to be related to stress.  Parents smoked and so had long secondhand smoke exposure growing up.      Immunization History  Administered Date(s) Administered  . Influenza,inj,Quad PF,6+ Mos 11/22/2018, 09/15/2019  . PFIZER(Purple Top)SARS-COV-2 Vaccination 03/02/2020, 03/23/2020  . PPD Test 10/22/2014, 11/05/2014  . Td 12/03/2006  . Tdap 12/19/2016     Objective: Vital Signs: BP 126/85 (BP Location: Left Arm, Patient Position: Sitting, Cuff Size: Normal)   Pulse 71   Resp 14   Ht 5\' 4"  (1.626 m)   Wt 111 lb 12.8 oz (50.7 kg)   BMI 19.19 kg/m    Physical Exam Vitals and nursing note reviewed.  Constitutional:      Appearance: She is well-developed and well-nourished.  HENT:     Head: Normocephalic and atraumatic.  Eyes:     Extraocular Movements: EOM normal.     Conjunctiva/sclera: Conjunctivae normal.  Cardiovascular:     Rate and Rhythm: Normal rate and regular rhythm.     Pulses: Intact distal pulses.     Heart sounds: Normal heart sounds.  Pulmonary:     Effort: Pulmonary effort is normal.     Breath sounds: Normal breath sounds.  Abdominal:     General: Bowel sounds are normal.     Palpations: Abdomen is soft.  Musculoskeletal:     Cervical back: Normal range of motion.  Lymphadenopathy:     Cervical: No cervical adenopathy.  Skin:    General: Skin is warm and dry.     Capillary Refill: Capillary refill takes less than 2 seconds.  Neurological:     Mental Status: She is alert and oriented to person, place, and time.  Psychiatric:        Mood and Affect: Mood and affect normal.        Behavior: Behavior normal.      Musculoskeletal Exam: C-spine was in good  range of motion.  She has some discomfort with left lateral rotation.  Lumbar spine with good range of motion with mild scoliosis.  Shoulder joints, elbow joints, wrist joints, MCPs PIPs and DIPs with good range of motion with no synovitis.  Hip joints, knee joints, ankles, MTPs and PIPs with good range of motion with no synovitis.  CDAI Exam: CDAI Score: -- Patient Global: --; Provider Global: -- Swollen: --; Tender: -- Joint Exam 01/27/2021   No joint exam has been documented for this visit   There is currently no information documented on the homunculus. Go to the Rheumatology activity and complete the homunculus joint exam.  Investigation: No additional findings.  Imaging: No results found.  Recent Labs: Lab Results  Component Value Date   WBC  5.2 02/26/2020   HGB 13.1 02/26/2020   PLT 240.0 02/26/2020   NA 139 02/26/2020   K 4.2 02/26/2020   CL 100 02/26/2020   CO2 34 (H) 02/26/2020   GLUCOSE 78 02/26/2020   BUN 17 02/26/2020   CREATININE 0.79 02/26/2020   BILITOT 0.6 02/26/2020   ALKPHOS 52 02/26/2020   AST 23 02/26/2020   ALT 15 02/26/2020   PROT 7.0 02/26/2020   ALBUMIN 4.3 02/26/2020   CALCIUM 10.0 02/26/2020   GFRAA 98 04/04/2018    Speciality Comments: No specialty comments available.  Procedures:  No procedures performed Allergies: Patient has no known allergies.   Assessment / Plan:     Visit Diagnoses: Other osteoporosis without current pathological fracture - DEXA 11/07/18 Left femoral neck -2.6, -0.9% change from previous BMD. Lumbar spine -2.3, -3.1% change in BMD since last DEXA.  Patient has been trying calcium, vitamin D and resistive exercises.  We will schedule repeat bone density.  Will discuss findings of DEXA scan once the results are available.  DDD (degenerative disc disease), cervical - MRI of the C-spine on 04/19/18, which revealed multilevel degenerative disc disease and facet arthorsis.  She had C7-T1 right-sided foraminal narrowing.   Her symptoms currently are on the left side.  She had bilateral trapezius spasm.  She has an appointment coming for physical therapy.  Have given her a handout on cervical spine exercises.  Some of the exercises were demonstrated in the office.  Other idiopathic scoliosis, lumbar region-she has lumbar scoliosis and she continues to have some lumbar spine discomfort.  She is going for physical therapy.  If she has persistent symptoms we will obtain x-rays at the follow-up visit.  Sicca syndrome (HCC) - She has chronic sicca symptoms. +ANA 1:160 NS, -Ro,-LA, -RF. Her mother has history of Sjogren's syndrome.  I discussed with her that she will meet the criteria for seronegative Sjogren's with a negative serology and sicca symptoms.  She has been using over-the-counter products which has been helpful.  I also offered pilocarpine.  Indications side effects contraindications were discussed but she declined.  She will be establishing with a new PCP and will be getting lab work.  Have advised her to forward labs to [place].  Orders: No orders of the defined types were placed in this encounter.  No orders of the defined types were placed in this encounter.     Follow-Up Instructions: Return in about 6 months (around 07/27/2021) for Osteoporosis, Osteoarthritis.   Pollyann Savoy, MD  Note - This record has been created using Animal nutritionist.  Chart creation errors have been sought, but may not always  have been located. Such creation errors do not reflect on  the standard of medical care.

## 2021-01-24 ENCOUNTER — Encounter: Payer: BC Managed Care – PPO | Admitting: Family Medicine

## 2021-01-27 ENCOUNTER — Other Ambulatory Visit: Payer: Self-pay

## 2021-01-27 ENCOUNTER — Ambulatory Visit: Payer: BC Managed Care – PPO | Admitting: Rheumatology

## 2021-01-27 ENCOUNTER — Telehealth: Payer: Self-pay | Admitting: Rheumatology

## 2021-01-27 ENCOUNTER — Encounter: Payer: Self-pay | Admitting: Rheumatology

## 2021-01-27 VITALS — BP 126/85 | HR 71 | Resp 14 | Ht 64.0 in | Wt 111.8 lb

## 2021-01-27 DIAGNOSIS — M3500 Sicca syndrome, unspecified: Secondary | ICD-10-CM | POA: Insufficient documentation

## 2021-01-27 DIAGNOSIS — M818 Other osteoporosis without current pathological fracture: Secondary | ICD-10-CM

## 2021-01-27 DIAGNOSIS — M79671 Pain in right foot: Secondary | ICD-10-CM

## 2021-01-27 DIAGNOSIS — M4126 Other idiopathic scoliosis, lumbar region: Secondary | ICD-10-CM

## 2021-01-27 DIAGNOSIS — M5030 Other cervical disc degeneration, unspecified cervical region: Secondary | ICD-10-CM

## 2021-01-27 DIAGNOSIS — M79642 Pain in left hand: Secondary | ICD-10-CM

## 2021-01-27 NOTE — Telephone Encounter (Signed)
Patient went to a Physical Therapist in the past, but he is not always available. Patient would like to know whom Dr. Corliss Skains would recommend, and would she send in a referral? Please call to advise.

## 2021-01-27 NOTE — Progress Notes (Signed)
Verbal order provided for DEXA scan by Dr. Corliss Skains. Order placed.

## 2021-01-27 NOTE — Patient Instructions (Signed)
Back Exercises The following exercises strengthen the muscles that help to support the trunk and back. They also help to keep the lower back flexible. Doing these exercises can help to prevent back pain or lessen existing pain.  If you have back pain or discomfort, try doing these exercises 2-3 times each day or as told by your health care provider.  As your pain improves, do them once each day, but increase the number of times that you repeat the steps for each exercise (do more repetitions).  To prevent the recurrence of back pain, continue to do these exercises once each day or as told by your health care provider. Do exercises exactly as told by your health care provider and adjust them as directed. It is normal to feel mild stretching, pulling, tightness, or discomfort as you do these exercises, but you should stop right away if you feel sudden pain or your pain gets worse. Exercises Single knee to chest Repeat these steps 3-5 times for each leg: 1. Lie on your back on a firm bed or the floor with your legs extended. 2. Bring one knee to your chest. Your other leg should stay extended and in contact with the floor. 3. Hold your knee in place by grabbing your knee or thigh with both hands and hold. 4. Pull on your knee until you feel a gentle stretch in your lower back or buttocks. 5. Hold the stretch for 10-30 seconds. 6. Slowly release and straighten your leg. Pelvic tilt Repeat these steps 5-10 times: 1. Lie on your back on a firm bed or the floor with your legs extended. 2. Bend your knees so they are pointing toward the ceiling and your feet are flat on the floor. 3. Tighten your lower abdominal muscles to press your lower back against the floor. This motion will tilt your pelvis so your tailbone points up toward the ceiling instead of pointing to your feet or the floor. 4. With gentle tension and even breathing, hold this position for 5-10 seconds. Cat-cow Repeat these steps until  your lower back becomes more flexible: 1. Get into a hands-and-knees position on a firm surface. Keep your hands under your shoulders, and keep your knees under your hips. You may place padding under your knees for comfort. 2. Let your head hang down toward your chest. Contract your abdominal muscles and point your tailbone toward the floor so your lower back becomes rounded like the back of a cat. 3. Hold this position for 5 seconds. 4. Slowly lift your head, let your abdominal muscles relax and point your tailbone up toward the ceiling so your back forms a sagging arch like the back of a cow. 5. Hold this position for 5 seconds.   Press-ups Repeat these steps 5-10 times: 1. Lie on your abdomen (face-down) on the floor. 2. Place your palms near your head, about shoulder-width apart. 3. Keeping your back as relaxed as possible and keeping your hips on the floor, slowly straighten your arms to raise the top half of your body and lift your shoulders. Do not use your back muscles to raise your upper torso. You may adjust the placement of your hands to make yourself more comfortable. 4. Hold this position for 5 seconds while you keep your back relaxed. 5. Slowly return to lying flat on the floor.   Bridges Repeat these steps 10 times: 1. Lie on your back on a firm surface. 2. Bend your knees so they are pointing toward the   ceiling and your feet are flat on the floor. Your arms should be flat at your sides, next to your body. 3. Tighten your buttocks muscles and lift your buttocks off the floor until your waist is at almost the same height as your knees. You should feel the muscles working in your buttocks and the back of your thighs. If you do not feel these muscles, slide your feet 1-2 inches farther away from your buttocks. 4. Hold this position for 3-5 seconds. 5. Slowly lower your hips to the starting position, and allow your buttocks muscles to relax completely. If this exercise is too easy, try  doing it with your arms crossed over your chest.   Abdominal crunches Repeat these steps 5-10 times: 1. Lie on your back on a firm bed or the floor with your legs extended. 2. Bend your knees so they are pointing toward the ceiling and your feet are flat on the floor. 3. Cross your arms over your chest. 4. Tip your chin slightly toward your chest without bending your neck. 5. Tighten your abdominal muscles and slowly raise your trunk (torso) high enough to lift your shoulder blades a tiny bit off the floor. Avoid raising your torso higher than that because it can put too much stress on your low back and does not help to strengthen your abdominal muscles. 6. Slowly return to your starting position. Back lifts Repeat these steps 5-10 times: 1. Lie on your abdomen (face-down) with your arms at your sides, and rest your forehead on the floor. 2. Tighten the muscles in your legs and your buttocks. 3. Slowly lift your chest off the floor while you keep your hips pressed to the floor. Keep the back of your head in line with the curve in your back. Your eyes should be looking at the floor. 4. Hold this position for 3-5 seconds. 5. Slowly return to your starting position. Contact a health care provider if:  Your back pain or discomfort gets much worse when you do an exercise.  Your worsening back pain or discomfort does not lessen within 2 hours after you exercise. If you have any of these problems, stop doing these exercises right away. Do not do them again unless your health care provider says that you can. Get help right away if:  You develop sudden, severe back pain. If this happens, stop doing the exercises right away. Do not do them again unless your health care provider says that you can. This information is not intended to replace advice given to you by your health care provider. Make sure you discuss any questions you have with your health care provider. Document Revised: 04/20/2019  Document Reviewed: 09/15/2018 Elsevier Patient Education  2021 [employer] Cervical Strain and Sprain Rehab Ask your health care provider which exercises are safe for you. Do exercises exactly as told by your health care provider and adjust them as directed. It is normal to feel mild stretching, pulling, tightness, or discomfort as you do these exercises. Stop right away if you feel sudden pain or your pain gets worse. Do not begin these exercises until told by your health care provider. Stretching and range-of-motion exercises Cervical side bending 1. Using good posture, sit on a stable chair or stand up. 2. Without moving your shoulders, slowly tilt your left / right ear to your shoulder until you feel a stretch in the opposite side neck muscles. You should be looking straight ahead. 3. Hold for __________ seconds. 4. Repeat with   the other side of your neck. Repeat __________ times. Complete this exercise __________ times a day.   Cervical rotation 7. Using good posture, sit on a stable chair or stand up. 8. Slowly turn your head to the side as if you are looking over your left / right shoulder. ? Keep your eyes level with the ground. ? Stop when you feel a stretch along the side and the back of your neck. 9. Hold for __________ seconds. 10. Repeat this by turning to your other side. Repeat __________ times. Complete this exercise __________ times a day.   Thoracic extension and pectoral stretch 5. Roll a towel or a small blanket so it is about 4 inches (10 cm) in diameter. 6. Lie down on your back on a firm surface. 7. Put the towel lengthwise, under your spine in the middle of your back. It should not be under your shoulder blades. The towel should line up with your spine from your middle back to your lower back. 8. Put your hands behind your head and let your elbows fall out to your sides. 9. Hold for __________ seconds. Repeat __________ times. Complete this exercise __________  times a day. Strengthening exercises Isometric upper cervical flexion 6. Lie on your back with a thin pillow behind your head and a small rolled-up towel under your neck. 7. Gently tuck your chin toward your chest and nod your head down to look toward your feet. Do not lift your head off the pillow. 8. Hold for __________ seconds. 9. Release the tension slowly. Relax your neck muscles completely before you repeat this exercise. Repeat __________ times. Complete this exercise __________ times a day. Isometric cervical extension 6. Stand about 6 inches (15 cm) away from a wall, with your back facing the wall. 7. Place a soft object, about 6-8 inches (15-20 cm) in diameter, between the back of your head and the wall. A soft object could be a small pillow, a ball, or a folded towel. 8. Gently tilt your head back and press into the soft object. Keep your jaw and forehead relaxed. 9. Hold for __________ seconds. 10. Release the tension slowly. Relax your neck muscles completely before you repeat this exercise. Repeat __________ times. Complete this exercise __________ times a day.   Posture and body mechanics Body mechanics refers to the movements and positions of your body while you do your daily activities. Posture is part of body mechanics. Good posture and healthy body mechanics can help to relieve stress in your body's tissues and joints. Good posture means that your spine is in its natural S-curve position (your spine is neutral), your shoulders are pulled back slightly, and your head is not tipped forward. The following are general guidelines for applying improved posture and body mechanics to your everyday activities. Sitting 6. When sitting, keep your spine neutral and keep your feet flat on the floor. Use a footrest, if necessary, and keep your thighs parallel to the floor. Avoid rounding your shoulders, and avoid tilting your head forward. 7. When working at a desk or a computer, keep your  desk at a height where your hands are slightly lower than your elbows. Slide your chair under your desk so you are close enough to maintain good posture. 8. When working at a computer, place your monitor at a height where you are looking straight ahead and you do not have to tilt your head forward or downward to look at the screen.   Standing  When standing,   keep your spine neutral and keep your feet about hip-width apart. Keep a slight bend in your knees. Your ears, shoulders, and hips should line up.  When you do a task in which you stand in one place for a long time, place one foot up on a stable object that is 2-4 inches (5-10 cm) high, such as a footstool. This helps keep your spine neutral.   Resting When lying down and resting, avoid positions that are most painful for you. Try to support your neck in a neutral position. You can use a contour pillow or a small rolled-up towel. Your pillow should support your neck but not push on it. This information is not intended to replace advice given to you by your health care provider. Make sure you discuss any questions you have with your health care provider. Document Revised: 04/05/2019 Document Reviewed: 09/14/2018 Elsevier Patient Education  2021 Elsevier Inc.  

## 2021-01-27 NOTE — Telephone Encounter (Signed)
Please call patient and advise. Thank you.

## 2021-01-27 NOTE — Telephone Encounter (Signed)
Please asked patient what would be her preferred site for physical therapy and refer her there.

## 2021-01-27 NOTE — Addendum Note (Signed)
Addended by: Ellen Henri on: 01/27/2021 09:07 AM   Modules accepted: Orders

## 2021-01-27 NOTE — Telephone Encounter (Signed)
Spoke with patient and she states she has seen Dr. Ellamae Sia. Patient states he is good for a PRN PT appointment  But not for regular visit. Patient states there is no goal there. Patient prefers to be referred somewhere is [place] as she lives in [place].

## 2021-02-03 DIAGNOSIS — Z130 Encounter for screening for diseases of the blood and blood-forming organs and certain disorders involving the immune mechanism: Secondary | ICD-10-CM | POA: Diagnosis not present

## 2021-02-03 DIAGNOSIS — Z1389 Encounter for screening for other disorder: Secondary | ICD-10-CM | POA: Diagnosis not present

## 2021-02-03 DIAGNOSIS — M419 Scoliosis, unspecified: Secondary | ICD-10-CM | POA: Diagnosis not present

## 2021-02-03 DIAGNOSIS — Z6820 Body mass index (BMI) 20.0-20.9, adult: Secondary | ICD-10-CM | POA: Diagnosis not present

## 2021-02-03 DIAGNOSIS — M5030 Other cervical disc degeneration, unspecified cervical region: Secondary | ICD-10-CM | POA: Diagnosis not present

## 2021-02-03 DIAGNOSIS — Z01419 Encounter for gynecological examination (general) (routine) without abnormal findings: Secondary | ICD-10-CM | POA: Diagnosis not present

## 2021-02-17 ENCOUNTER — Other Ambulatory Visit: Payer: Self-pay

## 2021-02-17 ENCOUNTER — Ambulatory Visit (HOSPITAL_BASED_OUTPATIENT_CLINIC_OR_DEPARTMENT_OTHER): Payer: BC Managed Care – PPO | Attending: Rheumatology | Admitting: Physical Therapy

## 2021-02-17 DIAGNOSIS — R293 Abnormal posture: Secondary | ICD-10-CM

## 2021-02-17 DIAGNOSIS — M5030 Other cervical disc degeneration, unspecified cervical region: Secondary | ICD-10-CM | POA: Diagnosis not present

## 2021-02-17 DIAGNOSIS — M542 Cervicalgia: Secondary | ICD-10-CM

## 2021-02-17 DIAGNOSIS — M25551 Pain in right hip: Secondary | ICD-10-CM

## 2021-02-17 DIAGNOSIS — M6281 Muscle weakness (generalized): Secondary | ICD-10-CM

## 2021-02-17 NOTE — Therapy (Signed)
[place], [place], [address] Phone: [phone]   Fax:  [phone]  Physical Therapy Evaluation  Patient Details  Name: Christine Ford MRN: [id number] Date of Birth: 1961-05-16 Referring Provider (PT): Bo Merino, MD   Encounter Date: 02/17/2021   PT End of Session - 02/17/21 0940    Visit Number 1    Number of Visits 13    Date for PT Re-Evaluation 03/31/21    Authorization Type [employer]    PT Start Time 0940    PT Stop Time 1025    PT Time Calculation (min) 45 min    Activity Tolerance Patient tolerated treatment well    Behavior During Therapy [facility] for tasks assessed/performed           Past Medical History:  Diagnosis Date  . Anxiety   . Arthritis   . Family history of dementia, mother, age > 31 07/28/2017  . Osteopenia   . Temporomandibular joint disorder (TMJ)     Past Surgical History:  Procedure Laterality Date  . BREAST SURGERY    . WISDOM TOOTH EXTRACTION      There were no vitals filed for this visit.    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 Pt notes moderate DDD T1 and C7 at the worst. Pt states it really bothered her in the last 6 months. Pt with mild scoliosis affecting R hip. Pt is still working Tues-Thurs a full 8 hours. Pt has met a PT as needed the last 2 years and was given some exercises (notes that she does not do them regularly). Pain is affecting sleep. Pt reports L side neck tingling.    Pertinent History Osteoporosis, sicca syndrome, DDD    Limitations Lifting    How long can you sit comfortably? n/a    How long can you stand comfortably? n/a    How long can you walk comfortably? n/a    Patient Stated Goals Pt would like exercises for her neck and bac to offset her work postures and improve alignment    Currently in Pain? Yes    Pain Score 7    Worse at night   Pain  Location Neck    Pain Orientation Left    Pain Descriptors / Indicators Aching    Pain Type Chronic pain    Pain Onset More than a month ago    Pain Frequency Constant    Aggravating Factors  Certain neck motions    Pain Relieving Factors Tylenol, massage/traction    Effect of Pain on Daily Activities Difficulty sleeping              [facility] PT Assessment - 02/17/21 0001      Assessment   Medical Diagnosis M50.30 (ICD-10-CM) - DDD (degenerative disc disease), cervical  sicca syndrome, degenerative disc disease and osteoporosis    Referring Provider (PT) Bo Merino, MD      Balance Screen   Has the patient fallen in the past 6 months No      [place] residence    Living Arrangements Spouse/significant other    Type of Bliss      Prior Function   Level of [place] Part time employment   Tues-Thurs 8 hrs   Community education officer    Leisure Exercise classes at  the gym      Posture/Postural Control   Posture/Postural Control Postural limitations    Postural Limitations Forward head;Rounded Shoulders      AROM   Cervical Flexion 55    Cervical Extension 30    Cervical - Right Side Bend 20    Cervical - Left Side Bend 20    Cervical - Right Rotation 45    Cervical - Left Rotation 38      PROM   Cervical Flexion WFL    Cervical Extension WFL    Cervical - Right Side Bend WFL    Cervical - Left Side Bend WFL    Cervical - Right Rotation WFL    Cervical - Left Rotation WFL      Strength   Overall Strength --   Bilat UEs grossly 5/5   Strength Assessment Site Cervical    Cervical Flexion --   Able to hold 10 sec     Palpation   Spinal mobility Hypermobile cervical spine throughout    Palpation comment No muscle TTP                      Objective measurements completed on examination: See above findings.               PT Education - 02/17/21 1036     Education Details Discussed HEP, POC and exam findings    Person(s) Educated Patient    Methods Explanation;Handout;Demonstration;Tactile cues    Comprehension Verbalized understanding;Returned demonstration;Need further instruction            PT Short Term Goals - 02/17/21 1046      PT SHORT TERM GOAL #1   Title STG = LTGs             PT Long Term Goals - 02/17/21 1047      PT LONG TERM GOAL #1   Title Pt will be independent with strengthening exercises for home and in the gym    Time 6    Period Weeks    Status New    Target Date 03/31/21      PT LONG TERM GOAL #2   Title Pt will be able to maintain deep neck flexor hold without compensation for at least 30 sec    Time 6    Period Weeks    Status New    Target Date 03/31/21      PT LONG TERM GOAL #3   Title Pt will report >/= 50% reduction of her neck pain    Baseline Constant pain with difficulty sleeping    Time 6    Period Weeks    Status New    Target Date 03/31/21      PT LONG TERM GOAL #4   Title Pt will have cervical AROM = cervical PROM for improved neck mobility    Baseline PROM WFL; cervical AROM limited in all directions except flexion/extension    Time 6    Period Weeks    Status New    Target Date 03/31/21      PT LONG TERM GOAL #5   Title PT will assess and provide goals for R hip/lumbar spine as needed                  Plan - 02/17/21 1037    Clinical Impression Statement Mrs. Christine Ford is a 60 y/o F presenting to OPPT due to complaint of L side neck and R hip pain.  Today's evaluation limited mostly to cervical assessment -- will assess R hip/lumbar spine when able. On assessment, pt demos increased forward head posture with hyperactive upper traps, decreased cervical strength and AROM with pain noted. PROM WFL and not painful; cervical mobility appears highly hypermobile. No focal points of tenderness noted. Shoulder strength grossly WFL; however, scapular muscles appear weak. Pt  would benefit from PT to improve her strength for reduced pain and comfort with work activities and improved sleep quality.    Personal Factors and Comorbidities Age;Time since onset of injury/illness/exacerbation;Profession;Past/Current Experience;Fitness    Examination-Activity Limitations Bend;Sleep    Examination-Participation Restrictions Community Activity;Occupation;Driving    Stability/Clinical Decision Making Evolving/Moderate complexity    Clinical Decision Making Moderate    Rehab Potential Good    PT Frequency 2x / week    PT Duration 6 weeks    PT Treatment/Interventions ADLs/Self Care Home Management    PT Next Visit Plan Assess response to HEP. Continue neck strengthening (consider biofeedback, isometrics, deep neck flexor strengthening). Continue scapular/midback strengthening    PT Home Exercise Plan Access Code [email or web address]    Consulted and Agree with Plan of Care Patient           Patient will benefit from skilled therapeutic intervention in order to improve the following deficits and impairments:  Decreased range of motion,Decreased coordination,Hypermobility,Pain,Decreased activity tolerance,Improper body mechanics,Decreased mobility,Decreased strength,Impaired sensation,Postural dysfunction  Visit Diagnosis: Abnormal posture  Cervicalgia  Muscle weakness (generalized)  Pain in right hip     Problem List Patient Active Problem List   Diagnosis Date Noted  . Sicca syndrome ([place]) 01/27/2021  . Other idiopathic scoliosis, lumbar region 09/30/2018  . DDD (degenerative disc disease), cervical 05/06/2018  . Chronic left shoulder pain 05/06/2018  . History of palpitations 07/29/2017  . Osteoporosis 07/28/2017  . Family history of premature CAD 07/28/2017  . Primary insomnia 09/26/2008  . GAD (generalized anxiety disorder) 09/28/2007  . 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    [facility] [address] PT, DPT 02/17/2021, 10:51 AM  [facility] [place], [place], [address] Phone: [phone]   Fax:  [phone]  Name: Christine Ford MRN: [id number] Date of Birth: 1961/11/14

## 2021-02-21 ENCOUNTER — Ambulatory Visit (HOSPITAL_BASED_OUTPATIENT_CLINIC_OR_DEPARTMENT_OTHER): Payer: BC Managed Care – PPO | Admitting: Physical Therapy

## 2021-02-24 ENCOUNTER — Ambulatory Visit (HOSPITAL_BASED_OUTPATIENT_CLINIC_OR_DEPARTMENT_OTHER): Payer: BC Managed Care – PPO | Admitting: Physical Therapy

## 2021-02-24 ENCOUNTER — Other Ambulatory Visit: Payer: Self-pay

## 2021-02-24 DIAGNOSIS — M25551 Pain in right hip: Secondary | ICD-10-CM

## 2021-02-24 DIAGNOSIS — M5030 Other cervical disc degeneration, unspecified cervical region: Secondary | ICD-10-CM | POA: Diagnosis not present

## 2021-02-24 DIAGNOSIS — M6281 Muscle weakness (generalized): Secondary | ICD-10-CM

## 2021-02-24 DIAGNOSIS — M542 Cervicalgia: Secondary | ICD-10-CM

## 2021-02-24 DIAGNOSIS — R293 Abnormal posture: Secondary | ICD-10-CM

## 2021-02-24 NOTE — Therapy (Signed)
[facility] [address] [place], [place], [address] Phone: [phone]   Fax:  [phone]  Physical Therapy Treatment  Patient Details  Name: Christine Ford MRN: [id number] Date of Birth: 1961/03/21 Referring Provider (PT): Pollyann Savoy, MD   Encounter Date: 02/24/2021   PT End of Session - 02/24/21 1518    Visit Number 2    Number of Visits 13    Date for PT Re-Evaluation 03/31/21    Authorization Type BCBS    PT Start Time 1435    PT Stop Time 1518    PT Time Calculation (min) 43 min    Activity Tolerance Patient tolerated treatment well    Behavior During Therapy [facility] for tasks assessed/performed           Past Medical History:  Diagnosis Date  . Anxiety   . Arthritis   . Family history of dementia, mother, age > 37 07/28/2017  . Osteopenia   . Temporomandibular joint disorder (TMJ)     Past Surgical History:  Procedure Laterality Date  . BREAST SURGERY    . WISDOM TOOTH EXTRACTION      There were no vitals filed for this visit.   Subjective Assessment - 02/24/21 1437    Subjective Pt reports continued tingling along L side of neck. Pt states she went to body pump Saturday. Pt states she did feel better about being back; however, was hesitant to begin barbell exercises again. Pt reports sleep is about the same. Pt states she was able to do a few exercises but lost her paper while helping her son move.    Pertinent History Osteoporosis, sicca syndrome, DDD    Limitations Lifting    How long can you sit comfortably? n/a    How long can you stand comfortably? n/a    How long can you walk comfortably? n/a    Patient Stated Goals Pt would like exercises for her neck and bac to offset her work postures and improve alignment    Currently in Pain? Yes    Pain Score 5     Pain Location Neck    Pain Orientation Left    Pain Onset More than a month ago                             [facility]  Adult PT Treatment/Exercise - 02/24/21 0001      Neck Exercises: Machines for Strengthening   UBE (Upper Arm Bike) L1 3 min forward, 3 min backward      Neck Exercises: Standing   Other Standing Exercises Horizontal abduction orange tband 2x10 with head stabilized on wall    Other Standing Exercises Standing row orange tband 2x10      Neck Exercises: Supine   Cervical Isometrics Flexion;Extension;Right lateral flexion;Left lateral flexion;Right rotation;Left rotation;5 secs   3 reps each   Neck Retraction 5 reps;10 secs    Neck Retraction Limitations with biofeedback    Capital Flexion 5 reps;5 secs    Capital Flexion Limitations with biofeedback    Lateral Flexion Right;Left;5 reps    Lateral Flexion Limitations AAROM; limited due to fatigue      Neck Exercises: Sidelying   Other Sidelying Exercise Open/close book 10 x 3 sec hold bilat      Manual Therapy   Manual Therapy Soft tissue mobilization    Soft tissue mobilization STW and TPR of bilateral upper traps (R worse than L)  PT Short Term Goals - 02/17/21 1046      PT SHORT TERM GOAL #1   Title STG = LTGs             PT Long Term Goals - 02/17/21 1047      PT LONG TERM GOAL #1   Title Pt will be independent with strengthening exercises for home and in the gym    Time 6    Period Weeks    Status New    Target Date 03/31/21      PT LONG TERM GOAL #2   Title Pt will be able to maintain deep neck flexor hold without compensation for at least 30 sec    Time 6    Period Weeks    Status New    Target Date 03/31/21      PT LONG TERM GOAL #3   Title Pt will report >/= 50% reduction of her neck pain    Baseline Constant pain with difficulty sleeping    Time 6    Period Weeks    Status New    Target Date 03/31/21      PT LONG TERM GOAL #4   Title Pt will have cervical AROM = cervical PROM for improved neck mobility    Baseline PROM WFL; cervical AROM limited in all directions except  flexion/extension    Time 6    Period Weeks    Status New    Target Date 03/31/21      PT LONG TERM GOAL #5   Title PT will assess and provide goals for R hip/lumbar spine as needed                 Plan - 02/24/21 1700    Clinical Impression Statement Treatment session focused on initiating neck strengthening exercises with focus on deep neck flexor endurance along with periscapular muscles and midback. Worked on NMED of deep neck flexor activation without additonal contraction of upper trap or low back. Pt's neck fatigues especially with side bending; AAROM performed in supine. Manual therapy applied to pt's tight upper traps.    Personal Factors and Comorbidities Age;Time since onset of injury/illness/exacerbation;Profession;Past/Current Experience;Fitness    Examination-Activity Limitations Bend;Sleep    Examination-Participation Restrictions Community Activity;Occupation;Driving    Stability/Clinical Decision Making Evolving/Moderate complexity    Rehab Potential Good    PT Frequency 2x / week    PT Duration 6 weeks    PT Treatment/Interventions ADLs/Self Care Home Management;Biofeedback;Aquatic Therapy;Electrical Stimulation;Iontophoresis 4mg /ml Dexamethasone;Traction;Therapeutic activities;Functional mobility training;Gait training;Therapeutic exercise;Balance training;Neuromuscular re-education;Patient/family education;Manual techniques;Passive range of motion;Taping    PT Next Visit Plan How were neck exercises? Educate on stretching as needed. Continue neck strengthening (i.e. biofeedback, isometrics, deep neck flexor strengthening). Continue scapular/midback strengthening.    PT Home Exercise Plan Access Code [email or web address]    Consulted and Agree with Plan of Care Patient           Patient will benefit from skilled therapeutic intervention in order to improve the following deficits and impairments:  Decreased range of motion,Decreased coordination,Hypermobility,Pain,Decreased  activity tolerance,Improper body mechanics,Decreased mobility,Decreased strength,Impaired sensation,Postural dysfunction  Visit Diagnosis: Abnormal posture  Cervicalgia  Muscle weakness (generalized)  Pain in right hip     Problem List Patient Active Problem List   Diagnosis Date Noted  . Sicca syndrome (HCC) 01/27/2021  . Other idiopathic scoliosis, lumbar region 09/30/2018  . DDD (degenerative disc disease), cervical 05/06/2018  . Chronic left shoulder pain 05/06/2018  . History of palpitations 07/29/2017  .  Osteoporosis 07/28/2017  . Family history of premature CAD 07/28/2017  . Primary insomnia 09/26/2008  . GAD (generalized anxiety disorder) 09/28/2007  . 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 [place], [place], [address] Phone: [phone]   Fax:  [phone]  Name: Christine Ford MRN: [id number] Date of Birth: July 14, 1961

## 2021-02-26 ENCOUNTER — Encounter: Payer: Self-pay | Admitting: Family Medicine

## 2021-02-27 ENCOUNTER — Other Ambulatory Visit: Payer: Self-pay

## 2021-02-27 DIAGNOSIS — F4323 Adjustment disorder with mixed anxiety and depressed mood: Secondary | ICD-10-CM | POA: Diagnosis not present

## 2021-02-27 DIAGNOSIS — M818 Other osteoporosis without current pathological fracture: Secondary | ICD-10-CM

## 2021-02-28 ENCOUNTER — Other Ambulatory Visit: Payer: Self-pay

## 2021-02-28 ENCOUNTER — Encounter: Payer: BC Managed Care – PPO | Admitting: Family Medicine

## 2021-02-28 ENCOUNTER — Ambulatory Visit (HOSPITAL_BASED_OUTPATIENT_CLINIC_OR_DEPARTMENT_OTHER): Payer: BC Managed Care – PPO | Attending: Rheumatology | Admitting: Physical Therapy

## 2021-02-28 DIAGNOSIS — R293 Abnormal posture: Secondary | ICD-10-CM | POA: Diagnosis not present

## 2021-02-28 DIAGNOSIS — M25551 Pain in right hip: Secondary | ICD-10-CM | POA: Diagnosis not present

## 2021-02-28 DIAGNOSIS — M542 Cervicalgia: Secondary | ICD-10-CM | POA: Diagnosis not present

## 2021-02-28 DIAGNOSIS — M6281 Muscle weakness (generalized): Secondary | ICD-10-CM | POA: Diagnosis not present

## 2021-02-28 NOTE — Therapy (Signed)
[facility] [address] [place], [place], [address] Phone: [phone]   Fax:  [phone]  Physical Therapy Treatment  Patient Details  Name: Christine Ford MRN: [id number] Date of Birth: May 21, 1961 Referring Provider (PT): Pollyann Savoy, MD   Encounter Date: 02/28/2021   PT End of Session - 02/28/21 1457    Visit Number 3    Number of Visits 13    Date for PT Re-Evaluation 03/31/21    Authorization Type BCBS    PT Start Time 1353    PT Stop Time 1435    PT Time Calculation (min) 42 min    Activity Tolerance Patient tolerated treatment well    Behavior During Therapy [facility] for tasks assessed/performed           Past Medical History:  Diagnosis Date  . Anxiety   . Arthritis   . Family history of dementia, mother, age > 78 07/28/2017  . Osteopenia   . Temporomandibular joint disorder (TMJ)     Past Surgical History:  Procedure Laterality Date  . BREAST SURGERY    . WISDOM TOOTH EXTRACTION      There were no vitals filed for this visit.   Subjective Assessment - 02/28/21 1354    Subjective Pt states she had a knot x 2 days on the left side of her thoracic spine. Pt states she's been more aware of her posture. Pt states she has not had too much time to try the exercises.    Pertinent History Osteoporosis, sicca syndrome, DDD    Limitations Lifting    How long can you sit comfortably? n/a    How long can you stand comfortably? n/a    How long can you walk comfortably? n/a    Patient Stated Goals Pt would like exercises for her neck and bac to offset her work postures and improve alignment    Currently in Pain? Yes    Pain Score 5     Pain Orientation Left    Pain Descriptors / Indicators Aching    Pain Type Chronic pain    Pain Onset More than a month ago              [facility] PT Assessment - 02/28/21 0001      AROM   Cervical Flexion 55    Cervical Extension 35    Cervical - Right Side Bend 25     Cervical - Left Side Bend 25    Cervical - Right Rotation 57    Cervical - Left Rotation 55                         [facility] Adult PT Treatment/Exercise - 02/28/21 0001      Neck Exercises: Machines for Strengthening   UBE (Upper Arm Bike) L1 3 min forward, 3 min backward      Neck Exercises: Supine   Capital Flexion 5 reps;10 secs    Capital Flexion Limitations maintained with head off table    Lateral Flexion 10 reps;Right;Left   Eccentric contractions; concentric contraction increased pain on L   Other Supine Exercise AAROM lateral neck flexion with head off edge of table x5 reps      Neck Exercises: Prone   Other Prone Exercise Quadruped neck retraction maintained with Y's and I's scapular exercises x10 bilat      Manual Therapy   Soft tissue mobilization TPR and STW bilat upper trap and L QL  Neck Exercises: Stretches   Other Neck Stretches Child's pose with right side bend 2x30 sec    Other Neck Stretches low trunk rotation 2x30 sec for QL                    PT Short Term Goals - 02/17/21 1046      PT SHORT TERM GOAL #1   Title STG = LTGs             PT Long Term Goals - 02/17/21 1047      PT LONG TERM GOAL #1   Title Pt will be independent with strengthening exercises for home and in the gym    Time 6    Period Weeks    Status New    Target Date 03/31/21      PT LONG TERM GOAL #2   Title Pt will be able to maintain deep neck flexor hold without compensation for at least 30 sec    Time 6    Period Weeks    Status New    Target Date 03/31/21      PT LONG TERM GOAL #3   Title Pt will report >/= 50% reduction of her neck pain    Baseline Constant pain with difficulty sleeping    Time 6    Period Weeks    Status New    Target Date 03/31/21      PT LONG TERM GOAL #4   Title Pt will have cervical AROM = cervical PROM for improved neck mobility    Baseline PROM WFL; cervical AROM limited in all directions except  flexion/extension    Time 6    Period Weeks    Status New    Target Date 03/31/21      PT LONG TERM GOAL #5   Title PT will assess and provide goals for R hip/lumbar spine as needed                 Plan - 02/28/21 1454    Clinical Impression Statement Treatment focused on continuing to progress pt's neck strengthening exercises. Reinforced for pt to continue to work on her side bending strength in supine; initiated quadruped positioning for increased shoulder/scapular stabilization. Manual therapy applied to upper trap and L QL. Pt with increased QL tightness this session addressed with stretching and education on using tennis ball for self massage.    Personal Factors and Comorbidities Age;Time since onset of injury/illness/exacerbation;Profession;Past/Current Experience;Fitness    Examination-Activity Limitations Bend;Sleep    Examination-Participation Restrictions Community Activity;Occupation;Driving    Stability/Clinical Decision Making Evolving/Moderate complexity    Rehab Potential Good    PT Frequency 2x / week    PT Duration 6 weeks    PT Treatment/Interventions ADLs/Self Care Home Management;Biofeedback;Aquatic Therapy;Electrical Stimulation;Iontophoresis 4mg /ml Dexamethasone;Traction;Therapeutic activities;Functional mobility training;Gait training;Therapeutic exercise;Balance training;Neuromuscular re-education;Patient/family education;Manual techniques;Passive range of motion;Taping    PT Next Visit Plan How were neck exercises? Educate on stretching as needed. Continue neck strengthening (i.e. biofeedback, eccentrics, quadruped positioning). Continue scapular/midback strengthening. Consider taping as indicated    PT Home Exercise Plan Access Code [email or web address]    Consulted and Agree with Plan of Care Patient           Patient will benefit from skilled therapeutic intervention in order to improve the following deficits and impairments:  Decreased range of motion,Decreased  coordination,Hypermobility,Pain,Decreased activity tolerance,Improper body mechanics,Decreased mobility,Decreased strength,Impaired sensation,Postural dysfunction  Visit Diagnosis: Abnormal posture  Cervicalgia  Muscle weakness (generalized)  Pain in right  hip     Problem List Patient Active Problem List   Diagnosis Date Noted  . Sicca syndrome (HCC) 01/27/2021  . Other idiopathic scoliosis, lumbar region 09/30/2018  . DDD (degenerative disc disease), cervical 05/06/2018  . Chronic left shoulder pain 05/06/2018  . History of palpitations 07/29/2017  . Osteoporosis 07/28/2017  . Family history of premature CAD 07/28/2017  . Primary insomnia 09/26/2008  . GAD (generalized anxiety disorder) 09/28/2007  . 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    [facility] [address] PT, DPT 02/28/2021, 2:58 PM  [facility] [address] [place], [place], [address] Phone: [phone]   Fax:  [phone]  Name: Christine Ford MRN: [id number] Date of Birth: 11-30-1961

## 2021-03-03 ENCOUNTER — Ambulatory Visit (HOSPITAL_BASED_OUTPATIENT_CLINIC_OR_DEPARTMENT_OTHER): Payer: BC Managed Care – PPO | Admitting: Physical Therapy

## 2021-03-07 ENCOUNTER — Ambulatory Visit (INDEPENDENT_AMBULATORY_CARE_PROVIDER_SITE_OTHER): Payer: BC Managed Care – PPO | Admitting: Family Medicine

## 2021-03-07 ENCOUNTER — Ambulatory Visit (HOSPITAL_BASED_OUTPATIENT_CLINIC_OR_DEPARTMENT_OTHER): Payer: BC Managed Care – PPO | Admitting: Physical Therapy

## 2021-03-07 ENCOUNTER — Encounter: Payer: Self-pay | Admitting: Family Medicine

## 2021-03-07 ENCOUNTER — Other Ambulatory Visit: Payer: Self-pay

## 2021-03-07 VITALS — BP 132/88 | HR 73 | Temp 98.3°F | Resp 16 | Wt 118.6 lb

## 2021-03-07 DIAGNOSIS — M25551 Pain in right hip: Secondary | ICD-10-CM | POA: Diagnosis not present

## 2021-03-07 DIAGNOSIS — M5030 Other cervical disc degeneration, unspecified cervical region: Secondary | ICD-10-CM

## 2021-03-07 DIAGNOSIS — M6281 Muscle weakness (generalized): Secondary | ICD-10-CM

## 2021-03-07 DIAGNOSIS — R293 Abnormal posture: Secondary | ICD-10-CM

## 2021-03-07 DIAGNOSIS — Z0000 Encounter for general adult medical examination without abnormal findings: Secondary | ICD-10-CM | POA: Diagnosis not present

## 2021-03-07 DIAGNOSIS — M542 Cervicalgia: Secondary | ICD-10-CM | POA: Diagnosis not present

## 2021-03-07 DIAGNOSIS — Z8349 Family history of other endocrine, nutritional and metabolic diseases: Secondary | ICD-10-CM

## 2021-03-07 DIAGNOSIS — F4323 Adjustment disorder with mixed anxiety and depressed mood: Secondary | ICD-10-CM | POA: Diagnosis not present

## 2021-03-07 DIAGNOSIS — Z23 Encounter for immunization: Secondary | ICD-10-CM

## 2021-03-07 DIAGNOSIS — M818 Other osteoporosis without current pathological fracture: Secondary | ICD-10-CM

## 2021-03-07 DIAGNOSIS — M3500 Sicca syndrome, unspecified: Secondary | ICD-10-CM

## 2021-03-07 LAB — COMPREHENSIVE METABOLIC PANEL
ALT: 17 U/L (ref 0–35)
AST: 25 U/L (ref 0–37)
Albumin: 4.4 g/dL (ref 3.5–5.2)
Alkaline Phosphatase: 51 U/L (ref 39–117)
BUN: 18 mg/dL (ref 6–23)
CO2: 29 mEq/L (ref 19–32)
Calcium: 9.4 mg/dL (ref 8.4–10.5)
Chloride: 101 mEq/L (ref 96–112)
Creatinine, Ser: 0.82 mg/dL (ref 0.40–1.20)
GFR: 78.2 mL/min (ref >60.00)
Glucose, Bld: 87 mg/dL (ref 70–99)
Potassium: 4 mEq/L (ref 3.5–5.1)
Sodium: 138 mEq/L (ref 135–145)
Total Bilirubin: 0.9 mg/dL (ref 0.2–1.2)
Total Protein: 7.2 g/dL (ref 6.0–8.3)

## 2021-03-07 LAB — CBC WITH DIFFERENTIAL/PLATELET
Basophils Absolute: 0 10*3/uL (ref 0.0–0.1)
Basophils Relative: 0.7 % (ref 0.0–3.0)
Eosinophils Absolute: 0.1 10*3/uL (ref 0.0–0.7)
Eosinophils Relative: 1.8 % (ref 0.0–5.0)
HCT: 41.1 % (ref 36.0–46.0)
Hemoglobin: 14.1 g/dL (ref 12.0–15.0)
Lymphocytes Relative: 28.7 % (ref 12.0–46.0)
Lymphs Abs: 1.4 10*3/uL (ref 0.7–4.0)
MCHC: 34.2 g/dL (ref 30.0–36.0)
MCV: 93.8 fl (ref 78.0–100.0)
Monocytes Absolute: 0.4 10*3/uL (ref 0.1–1.0)
Monocytes Relative: 8.7 % (ref 3.0–12.0)
Neutro Abs: 2.9 10*3/uL (ref 1.4–7.7)
Neutrophils Relative %: 60.1 % (ref 43.0–77.0)
Platelets: 222 10*3/uL (ref 150.0–400.0)
RBC: 4.38 Mil/uL (ref 3.87–5.11)
RDW: 13.4 % (ref 11.5–15.5)
WBC: 4.9 10*3/uL (ref 4.0–10.5)

## 2021-03-07 LAB — LIPID PANEL
Cholesterol: 195 mg/dL (ref 0–200)
HDL: 80.4 mg/dL (ref >39.00)
LDL Cholesterol: 102 mg/dL — ABNORMAL HIGH (ref 0–99)
NonHDL: 114.61
Total CHOL/HDL Ratio: 2
Triglycerides: 63 mg/dL (ref 0.0–149.0)
VLDL: 12.6 mg/dL (ref 0.0–40.0)

## 2021-03-07 LAB — TSH: TSH: 2.69 u[IU]/mL (ref 0.35–4.50)

## 2021-03-07 LAB — VITAMIN D 25 HYDROXY (VIT D DEFICIENCY, FRACTURES): VITD: 49.77 ng/mL (ref 30.00–100.00)

## 2021-03-07 NOTE — Patient Instructions (Addendum)
Please return in 12 months for your annual complete physical; please come fasting. Please return in 2 months for a nurse visit for your second Shingrix vaccine. You may schedule a visit anytime if needed to discuss anxiety.   I will release your lab results to you on your MyChart account with further instructions. Please reply with any questions.  Today you were given your 1st of 2 Shingrix vaccination.   If you have any questions or concerns, please don't hesitate to send me a message via MyChart or call the office at [phone]. Thank you for visiting with [place] today! It's our pleasure caring for you.   Preventive Care 14-11 Years Old, Female Preventive care refers to lifestyle choices and visits with your health care provider that can promote health and wellness. This includes:  A yearly physical exam. This is also called an annual wellness visit.  Regular dental and eye exams.  Immunizations.  Screening for certain conditions.  Healthy lifestyle choices, such as: ? Eating a healthy diet. ? Getting regular exercise. ? Not using drugs or products that contain nicotine and tobacco. ? Limiting alcohol use. What can I expect for my preventive care visit? Physical exam Your health care provider will check your:  Height and weight. These may be used to calculate your BMI (body mass index). BMI is a measurement that tells if you are at a healthy weight.  Heart rate and blood pressure.  Body temperature.  Skin for abnormal spots. Counseling Your health care provider may ask you questions about your:  Past medical problems.  Family's medical history.  Alcohol, tobacco, and drug use.  Emotional well-being.  Home life and relationship well-being.  Sexual activity.  Diet, exercise, and sleep habits.  Work and work Statistician.  Access to firearms.  Method of birth control.  Menstrual cycle.  Pregnancy history. What immunizations do I need? Vaccines are usually  given at various ages, according to a schedule. Your health care provider will recommend vaccines for you based on your age, medical history, and lifestyle or other factors, such as travel or where you work.   What tests do I need? Blood tests  Lipid and cholesterol levels. These may be checked every 5 years, or more often if you are over 67 years old.  Hepatitis C test.  Hepatitis B test. Screening  Lung cancer screening. You may have this screening every year starting at age 29 if you have a 30-pack-year history of smoking and currently smoke or have quit within the past 15 years.  Colorectal cancer screening. ? All adults should have this screening starting at age 50 and continuing until age 19. ? Your health care provider may recommend screening at age 70 if you are at increased risk. ? You will have tests every 1-10 years, depending on your results and the type of screening test.  Diabetes screening. ? This is done by checking your blood sugar (glucose) after you have not eaten for a while (fasting). ? You may have this done every 1-3 years.  Mammogram. ? This may be done every 1-2 years. ? Talk with your health care provider about when you should start having regular mammograms. This may depend on whether you have a family history of breast cancer.  BRCA-related cancer screening. This may be done if you have a family history of breast, ovarian, tubal, or peritoneal cancers.  Pelvic exam and Pap test. ? This may be done every 3 years starting at age 72. ? Starting  at age 67, this may be done every 5 years if you have a Pap test in combination with an HPV test. Other tests  STD (sexually transmitted disease) testing, if you are at risk.  Bone density scan. This is done to screen for osteoporosis. You may have this scan if you are at high risk for osteoporosis. Talk with your health care provider about your test results, treatment options, and if necessary, the need for more  tests. Follow these instructions at home: Eating and drinking  Eat a diet that includes fresh fruits and vegetables, whole grains, lean protein, and low-fat dairy products.  Take vitamin and mineral supplements as recommended by your health care provider.  Do not drink alcohol if: ? Your health care provider tells you not to drink. ? You are pregnant, may be pregnant, or are planning to become pregnant.  If you drink alcohol: ? Limit how much you have to 0-1 drink a day. ? Be aware of how much alcohol is in your drink. In the [place] one drink equals one 12 oz bottle of beer (355 mL), one 5 oz glass of wine (148 mL), or one 1 oz glass of hard liquor (44 mL).   Lifestyle  Take daily care of your teeth and gums. Brush your teeth every morning and night with fluoride toothpaste. Floss one time each day.  Stay active. Exercise for at least 30 minutes 5 or more days each week.  Do not use any products that contain nicotine or tobacco, such as cigarettes, e-cigarettes, and chewing tobacco. If you need help quitting, ask your health care provider.  Do not use drugs.  If you are sexually active, practice safe sex. Use a condom or other form of protection to prevent STIs (sexually transmitted infections).  If you do not wish to become pregnant, use a form of birth control. If you plan to become pregnant, see your health care provider for a prepregnancy visit.  If told by your health care provider, take low-dose aspirin daily starting at age 42.  Find healthy ways to cope with stress, such as: ? Meditation, yoga, or listening to music. ? Journaling. ? Talking to a trusted person. ? Spending time with friends and family. Safety  Always wear your seat belt while driving or riding in a vehicle.  Do not drive: ? If you have been drinking alcohol. Do not ride with someone who has been drinking. ? When you are tired or distracted. ? While texting.  Wear a helmet and other protective  equipment during sports activities.  If you have firearms in your house, make sure you follow all gun safety procedures. What's next?  Visit your health care provider once a year for an annual wellness visit.  Ask your health care provider how often you should have your eyes and teeth checked.  Stay up to date on all vaccines. This information is not intended to replace advice given to you by your health care provider. Make sure you discuss any questions you have with your health care provider. Document Revised: 09/17/2020 Document Reviewed: 08/25/2018 Elsevier Patient Education  2021 [employer].

## 2021-03-07 NOTE — Progress Notes (Signed)
Subjective  Chief Complaint  Patient presents with  . Annual Exam    Fasting, requesting to have bloodwork done to check for Alpha 1 gene to check for COPD - family hx   . Anxiety    Increased anxiety - busy at work. Wanting to restart Lexapro     HPI: Christine Ford is a 59 y.o. female who presents to [facility] [place] today for a Female Wellness Visit.   Wellness Visit: annual visit with health maintenance review and exam without Pap   HM: screens up to date. Eligible for shingrix.  Had mammo with gyn cpe recently. Due dexa to f/u osteoporosis. Strong fh of copd - ? alpha1 antitrypsin deficiency.   Chronic djd and sicca syndrome. Reviewed rheum notes.  Assessment  1. Annual physical exam   2. Other osteoporosis without current pathological fracture   3. DDD (degenerative disc disease), cervical   4. Sicca syndrome (HCC)   5. FH: alpha 1 antitrypsin deficiency      Plan  Female Wellness Visit:  Age appropriate Health Maintenance and Prevention measures were discussed with patient. Included topics are cancer screening recommendations, ways to keep healthy (see AVS) including dietary and exercise recommendations, regular eye and dental care, use of seat belts, and avoidance of moderate alcohol use and tobacco use.   BMI: discussed patient's BMI and encouraged positive lifestyle modifications to help get to or maintain a target BMI.  HM needs and immunizations were addressed and ordered. See below for orders. See HM and immunization section for updates.  Routine labs and screening tests ordered including cmp, cbc and lipids where appropriate. Add alpha 1 antitrypsin level.   Discussed recommendations regarding Vit D and calcium supplementation (see AVS)  Follow up: Return in about 1 year (around 03/07/2022) for complete physical.   Orders Placed This Encounter  Procedures  . CBC with Differential/Platelet  . Comprehensive metabolic panel  . Lipid panel   . TSH  . VITAMIN D 25 Hydroxy (Vit-D Deficiency, Fractures)  . Alpha-1-antitrypsin   No orders of the defined types were placed in this encounter.     There is no height or weight on file to calculate BMI. Wt Readings from Last 3 Encounters:  01/27/21 111 lb 12.8 oz (50.7 kg)  09/20/20 116 lb 3.2 oz (52.7 kg)  02/26/20 115 lb 9.6 oz (52.4 kg)     Patient Active Problem List   Diagnosis Date Noted  . DDD (degenerative disc disease), cervical 05/06/2018    Priority: High    Multilevel spondylosis was noted with C4-5, C5-6, C6-7 narrowing.  Anterior spurring and posterior spurring was noted. Facet joint arthropathy was noted.     . Osteoporosis 07/28/2017    Priority: High  . Primary insomnia 09/26/2008    Priority: High    Previous evaluation by sleep medicine.  Thought to be due to psychophysiologic insomnia, however also consistent with TMJ and anxiety.  CBT was recommended as well as follow-up with dentist.   . GAD (generalized anxiety disorder) 09/28/2007    Priority: High  . Sicca syndrome (HCC) 01/27/2021    Priority: Medium    She has chronic sicca symptoms. +ANA 1:160 NS, -Ro,-LA, -RF. Her mother has history of Sjogren's syndrome.   . Chronic left shoulder pain 05/06/2018    Priority: Medium  . Family history of premature CAD 07/28/2017    Priority: Medium    Dad passed age    . Other idiopathic scoliosis, lumbar region  09/30/2018    Priority: Low  . History of palpitations 07/29/2017    Priority: Low    Negative cardiac evaluation: Normal 2018 ECHO    . 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 Chronic eczematoid otitis externa of both ears 11/01/2020   Health Maintenance  Topic Date Due  . COVID-19 Vaccine (3 - Booster for Pfizer series) 09/23/2020  . DEXA SCAN  11/07/2020  . MAMMOGRAM  12/28/2020  . COLONOSCOPY (Pts 45-103yrs Insurance coverage will need to be confirmed)  04/11/2024  . PAP SMEAR-Modifier  10/19/2024  . TETANUS/TDAP  12/19/2026   . INFLUENZA VACCINE  Completed  . Hepatitis C Screening  Completed  . HIV Screening  Completed  . HPV VACCINES  Aged Out   Immunization History  Administered Date(s) Administered  . Influenza,inj,Quad PF,6+ Mos 11/22/2018, 09/15/2019  . Influenza-Unspecified 09/12/2020  . PFIZER(Purple Top)SARS-COV-2 Vaccination 03/02/2020, 03/23/2020  . PPD Test 10/22/2014, 11/05/2014  . Td 12/03/2006  . Tdap 12/19/2016   We updated and reviewed the patient's past history in detail and it is documented below. Allergies: Patient has No Known Allergies. Past Medical History Patient  has a past medical history of Anxiety, Arthritis, Family history of dementia, mother, age > 33 (07/28/2017), Osteopenia, and Temporomandibular joint disorder (TMJ). Past Surgical History Patient  has a past surgical history that includes Breast surgery and Wisdom tooth extraction. Family History: Patient family history includes Alzheimer's disease in her mother; Breast cancer in her cousin and another family member; COPD in her mother; Coronary artery disease (age of onset: 38) in her mother; Glaucoma in her son; Healthy in her son and son; Heart attack (age of onset: 12) in her father; Heart disease in her father and sister; Hypertension in her father and mother; Lung cancer in her cousin, maternal aunt, and another family member; Osteoporosis in her mother. Social History:  Patient  reports that she has never smoked. She has never used smokeless tobacco. She reports current alcohol use. She reports that she does not use drugs.  Review of Systems: Constitutional: negative for fever or malaise Ophthalmic: negative for photophobia, double vision or loss of vision Cardiovascular: negative for chest pain, dyspnea on exertion, or new LE swelling Respiratory: negative for SOB or persistent cough Gastrointestinal: negative for abdominal pain, change in bowel habits or melena Genitourinary: negative for dysuria or gross hematuria,  no abnormal uterine bleeding or disharge Musculoskeletal: negative for new gait disturbance or muscular weakness Integumentary: negative for new or persistent rashes, no breast lumps Neurological: negative for TIA or stroke symptoms Psychiatric: negative for SI or delusions Allergic/Immunologic: negative for hives  Patient Care Team    Relationship Specialty Notifications Start End  Willow Ora, MD PCP - General Family Medicine  01/27/21   Huel Cote, MD Consulting Physician Obstetrics and Gynecology  11/23/11   Pollyann Savoy, MD Consulting Physician Rheumatology  07/28/19   Ermalinda Barrios, MD Attending Physician Otolaryngology  03/07/21     Objective  Vitals: There were no vitals taken for this visit. General:  Well developed, well nourished, no acute distress  Psych:  Alert and orientedx3,normal mood and affect HEENT:  Normocephalic, atraumatic, non-icteric sclera, supple neck without adenopathy, mass or thyromegaly Cardiovascular:  Normal S1, S2, RRR without gallop, rub or murmur Respiratory:  Good breath sounds bilaterally, CTAB with normal respiratory effort Gastrointestinal: normal bowel sounds, soft, non-tender, no noted masses. No HSM MSK: no deformities, contusions. Joints are without erythema or swelling.  Skin:  Warm, no  rashes or suspicious lesions noted Neurologic:    Mental status is normal. Gross motor and sensory exams are normal. Normal gait. No tremor   Commons side effects, risks, benefits, and alternatives for medications and treatment plan prescribed today were discussed, and the patient expressed understanding of the given instructions. Patient is instructed to call or message via MyChart if he/she has any questions or concerns regarding our treatment plan. No barriers to understanding were identified. We discussed Red Flag symptoms and signs in detail. Patient expressed understanding regarding what to do in case of urgent or emergency type symptoms.    Medication list was reconciled, printed and provided to the patient in AVS. Patient instructions and summary information was reviewed with the patient as documented in the AVS. This note was prepared with assistance of Dragon voice recognition software. Occasional wrong-word or sound-a-like substitutions may have occurred due to the inherent limitations of voice recognition software  This visit occurred during the SARS-CoV-2 public health emergency.  Safety protocols were in place, including screening questions prior to the visit, additional usage of staff PPE, and extensive cleaning of exam room while observing appropriate contact time as indicated for disinfecting solutions.

## 2021-03-09 NOTE — Therapy (Signed)
[facility] [address] [place], [place], [address] Phone: [phone]   Fax:  [phone]  Physical Therapy Treatment  Patient Details  Name: Christine Ford MRN: [id number] Date of Birth: 03-27-1961 Referring Provider (PT): Pollyann Savoy, MD   Encounter Date: 03/07/2021    Past Medical History:  Diagnosis Date  . Anxiety   . Arthritis   . Family history of dementia, mother, age > 53 07/28/2017  . Osteopenia   . Temporomandibular joint disorder (TMJ)     Past Surgical History:  Procedure Laterality Date  . BREAST SURGERY    . WISDOM TOOTH EXTRACTION      There were no vitals filed for this visit.   Subjective Assessment - 03/09/21 2243    Subjective Pt reports she was able to do the exercises. Pt notes some continued L neck tightness and pain. Pt reports much improved L QL tightness.    Pertinent History Osteoporosis, sicca syndrome, DDD    Limitations Lifting    How long can you sit comfortably? n/a    How long can you stand comfortably? n/a    How long can you walk comfortably? n/a    Patient Stated Goals Pt would like exercises for her neck and bac to offset her work postures and improve alignment    Pain Onset More than a month ago                             [facility] Adult PT Treatment/Exercise - 03/09/21 0001      Neck Exercises: Machines for Strengthening   UBE (Upper Arm Bike) L1 3 min forward, 3 min backward      Neck Exercises: Theraband   Other Theraband Exercises Lateral bending x10 orange tband bilat; neck retraction with orange tband x10      Neck Exercises: Seated   Lateral Flexion Right;Left;5 reps    Lateral Flexion Limitations AROM      Neck Exercises: Prone   Other Prone Exercise Quadruped neck retraction with Y's, rows, and alternating arm/leg raises 2x10 each      Manual Therapy   Soft tissue mobilization TPR and STW bilat upper trap and scalene (L more than R)                     PT Short Term Goals - 02/17/21 1046      PT SHORT TERM GOAL #1   Title STG = LTGs             PT Long Term Goals - 02/17/21 1047      PT LONG TERM GOAL #1   Title Pt will be independent with strengthening exercises for home and in the gym    Time 6    Period Weeks    Status New    Target Date 03/31/21      PT LONG TERM GOAL #2   Title Pt will be able to maintain deep neck flexor hold without compensation for at least 30 sec    Time 6    Period Weeks    Status New    Target Date 03/31/21      PT LONG TERM GOAL #3   Title Pt will report >/= 50% reduction of her neck pain    Baseline Constant pain with difficulty sleeping    Time 6    Period Weeks    Status New    Target Date 03/31/21  PT LONG TERM GOAL #4   Title Pt will have cervical AROM = cervical PROM for improved neck mobility    Baseline PROM WFL; cervical AROM limited in all directions except flexion/extension    Time 6    Period Weeks    Status New    Target Date 03/31/21      PT LONG TERM GOAL #5   Title PT will assess and provide goals for R hip/lumbar spine as needed                 Plan - 03/09/21 2243    Clinical Impression Statement Treatment focused on continuing to progress neck strengthening. Provided neck stretching for pt's continued hyperactive levators, scalenes, and upper traps. Initiated quadruped exercises.    Personal Factors and Comorbidities Age;Time since onset of injury/illness/exacerbation;Profession;Past/Current Experience;Fitness    Examination-Activity Limitations Bend;Sleep    Examination-Participation Restrictions Community Activity;Occupation;Driving    Stability/Clinical Decision Making Evolving/Moderate complexity    Rehab Potential Good    PT Frequency 2x / week    PT Duration 6 weeks    PT Treatment/Interventions ADLs/Self Care Home Management;Biofeedback;Aquatic Therapy;Electrical Stimulation;Iontophoresis 4mg /ml  Dexamethasone;Traction;Therapeutic activities;Functional mobility training;Gait training;Therapeutic exercise;Balance training;Neuromuscular re-education;Patient/family education;Manual techniques;Passive range of motion;Taping    PT Next Visit Plan How were neck exercises? Educate on stretching as needed. Continue neck strengthening (i.e. biofeedback, eccentrics, quadruped positioning). Continue scapular/midback strengthening. Consider taping as indicated    PT Home Exercise Plan Access Code [email or web address]    Consulted and Agree with Plan of Care Patient           Patient will benefit from skilled therapeutic intervention in order to improve the following deficits and impairments:  Decreased range of motion,Decreased coordination,Hypermobility,Pain,Decreased activity tolerance,Improper body mechanics,Decreased mobility,Decreased strength,Impaired sensation,Postural dysfunction  Visit Diagnosis: Abnormal posture  Cervicalgia  Muscle weakness (generalized)  Pain in right hip     Problem List Patient Active Problem List   Diagnosis Date Noted  . Sicca syndrome (HCC) 01/27/2021  . Chronic eczematoid otitis externa of both ears 11/01/2020  . Other idiopathic scoliosis, lumbar region 09/30/2018  . DDD (degenerative disc disease), cervical 05/06/2018  . Chronic left shoulder pain 05/06/2018  . History of palpitations 07/29/2017  . Osteoporosis 07/28/2017  . Family history of premature CAD 07/28/2017  . Primary insomnia 09/26/2008  . GAD (generalized anxiety disorder) 09/28/2007  . 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    Christine Ford April May 03/09/2021, 10:47 PM  [facility] [address] [place], [place], [place] Phone: [phone]   Fax:  [phone]  Name: Christine Ford MRN: Albertha Ghee Date of Birth: 1961/01/20

## 2021-03-10 ENCOUNTER — Other Ambulatory Visit: Payer: Self-pay

## 2021-03-10 ENCOUNTER — Ambulatory Visit (HOSPITAL_BASED_OUTPATIENT_CLINIC_OR_DEPARTMENT_OTHER): Payer: BC Managed Care – PPO | Admitting: Physical Therapy

## 2021-03-10 DIAGNOSIS — M6281 Muscle weakness (generalized): Secondary | ICD-10-CM

## 2021-03-10 DIAGNOSIS — M25551 Pain in right hip: Secondary | ICD-10-CM

## 2021-03-10 DIAGNOSIS — R293 Abnormal posture: Secondary | ICD-10-CM | POA: Diagnosis not present

## 2021-03-10 DIAGNOSIS — M542 Cervicalgia: Secondary | ICD-10-CM | POA: Diagnosis not present

## 2021-03-10 LAB — ALPHA-1-ANTITRYPSIN: A-1 Antitrypsin, Ser: 143 mg/dL (ref 83–199)

## 2021-03-10 NOTE — Therapy (Signed)
[facility] [address] [place], [place], [address] Phone: [phone]   Fax:  [phone]  Physical Therapy Treatment  Patient Details  Name: Christine Ford MRN: [id number] Date of Birth: 1961/04/06 Referring Provider (PT): Pollyann Savoy, MD   Encounter Date: 03/10/2021   PT End of Session - 03/10/21 1715    Visit Number 5    Number of Visits 13    Date for PT Re-Evaluation 03/31/21    Authorization Type BC[employer]    PT Start Time 1350    PT Stop Time 1430    PT Time Calculation (min) 40 min    Activity Tolerance Patient tolerated treatment well    Behavior During Therapy [facility] for tasks assessed/performed           Past Medical History:  Diagnosis Date  . Anxiety   . Arthritis   . Family history of dementia, mother, age > 70 07/28/2017  . Osteopenia   . Temporomandibular joint disorder (TMJ)     Past Surgical History:  Procedure Laterality Date  . BREAST SURGERY    . WISDOM TOOTH EXTRACTION      There were no vitals filed for this visit.   Subjective Assessment - 03/10/21 1437    Subjective Pt states she felt very sick after her shingles shot on Friday. She reports she was ill all of Saturday.    Pertinent History Osteoporosis, sicca syndrome, DDD    Limitations Lifting    How long can you sit comfortably? n/a    How long can you stand comfortably? n/a    How long can you walk comfortably? n/a    Patient Stated Goals Pt would like exercises for her neck and bac to offset her work postures and improve alignment    Pain Onset More than a month ago                             [facility] Adult PT Treatment/Exercise - 03/10/21 0001      Neck Exercises: Machines for Strengthening   UBE (Upper Arm Bike) L1 3 min forward, 3 min backward      Neck Exercises: Theraband   Shoulder Extension 20 reps;Green    Rows 20 reps;Green    Shoulder External Rotation 20 reps;Green    Other Theraband Exercises  Lateral bending x10 orange tband bilat; neck retraction with orange tband x10      Neck Exercises: Seated   Lateral Flexion Right;Left;5 reps    Lateral Flexion Limitations AROM      Neck Exercises: Prone   Other Prone Exercise Quadruped neck retraction with Y's, rows, and alternating arm/leg raises 2x10 each      Manual Therapy   Manual Therapy Joint mobilization    Joint Mobilization L rotation grade III mob along C5-C6. Side gliding and "bridging" grade II to III    Soft tissue mobilization TPR and STW bilat upper trap and scalene                    PT Short Term Goals - 02/17/21 1046      PT SHORT TERM GOAL #1   Title STG = LTGs             PT Long Term Goals - 02/17/21 1047      PT LONG TERM GOAL #1   Title Pt will be independent with strengthening exercises for home and in the gym  Time 6    Period Weeks    Status New    Target Date 03/31/21      PT LONG TERM GOAL #2   Title Pt will be able to maintain deep neck flexor hold without compensation for at least 30 sec    Time 6    Period Weeks    Status New    Target Date 03/31/21      PT LONG TERM GOAL #3   Title Pt will report >/= 50% reduction of her neck pain    Baseline Constant pain with difficulty sleeping    Time 6    Period Weeks    Status New    Target Date 03/31/21      PT LONG TERM GOAL #4   Title Pt will have cervical AROM = cervical PROM for improved neck mobility    Baseline PROM WFL; cervical AROM limited in all directions except flexion/extension    Time 6    Period Weeks    Status New    Target Date 03/31/21      PT LONG TERM GOAL #5   Title PT will assess and provide goals for R hip/lumbar spine as needed                 Plan - 03/10/21 1712    Clinical Impression Statement Treatment focused on modifying her theraband exercises for her neck and scapula/shoulder -- green tband was too much last session. Continued manual therapy and stretching for pt's hyperactive  levators, scalenes, and upper traps.    Personal Factors and Comorbidities Age;Time since onset of injury/illness/exacerbation;Profession;Past/Current Experience;Fitness    Examination-Activity Limitations Bend;Sleep    Examination-Participation Restrictions Community Activity;Occupation;Driving    Stability/Clinical Decision Making Evolving/Moderate complexity    Rehab Potential Good    PT Frequency 2x / week    PT Duration 6 weeks    PT Treatment/Interventions ADLs/Self Care Home Management;Biofeedback;Aquatic Therapy;Electrical Stimulation;Iontophoresis 4mg /ml Dexamethasone;Traction;Therapeutic activities;Functional mobility training;Gait training;Therapeutic exercise;Balance training;Neuromuscular re-education;Patient/family education;Manual techniques;Passive range of motion;Taping    PT Next Visit Plan How were neck exercises? Educate on stretching as needed. Continue neck strengthening (i.e. biofeedback, eccentrics, quadruped positioning). Continue scapular/midback strengthening. Consider taping as indicated    PT Home Exercise Plan Access Code [email or web address]    Consulted and Agree with Plan of Care Patient           Patient will benefit from skilled therapeutic intervention in order to improve the following deficits and impairments:  Decreased range of motion,Decreased coordination,Hypermobility,Pain,Decreased activity tolerance,Improper body mechanics,Decreased mobility,Decreased strength,Impaired sensation,Postural dysfunction  Visit Diagnosis: Abnormal posture  Cervicalgia  Muscle weakness (generalized)  Pain in right hip     Problem List Patient Active Problem List   Diagnosis Date Noted  . Sicca syndrome (HCC) 01/27/2021  . Chronic eczematoid otitis externa of both ears 11/01/2020  . Other idiopathic scoliosis, lumbar region 09/30/2018  . DDD (degenerative disc disease), cervical 05/06/2018  . Chronic left shoulder pain 05/06/2018  . History of palpitations 07/29/2017   . Osteoporosis 07/28/2017  . Family history of premature CAD 07/28/2017  . Primary insomnia 09/26/2008  . GAD (generalized anxiety disorder) 09/28/2007  . 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    [facility] [address] PT, DPT 03/10/2021, 5:19 PM  [facility] [place] [facility] [address] [place], [place], [place] Phone: [phone]   Fax:  [phone]  Name: Christine Ford MRN: Albertha Ghee Date of Birth: 1961/10/07

## 2021-03-14 ENCOUNTER — Ambulatory Visit (INDEPENDENT_AMBULATORY_CARE_PROVIDER_SITE_OTHER)
Admission: RE | Admit: 2021-03-14 | Discharge: 2021-03-14 | Disposition: A | Discharge status: Home / Self Care | Payer: BC Managed Care – PPO | Source: Ambulatory Visit | Attending: Family Medicine | Admitting: Family Medicine

## 2021-03-14 ENCOUNTER — Other Ambulatory Visit: Payer: Self-pay

## 2021-03-14 ENCOUNTER — Ambulatory Visit (HOSPITAL_BASED_OUTPATIENT_CLINIC_OR_DEPARTMENT_OTHER): Payer: BC Managed Care – PPO | Admitting: Physical Therapy

## 2021-03-14 DIAGNOSIS — F4323 Adjustment disorder with mixed anxiety and depressed mood: Secondary | ICD-10-CM | POA: Diagnosis not present

## 2021-03-14 DIAGNOSIS — M818 Other osteoporosis without current pathological fracture: Secondary | ICD-10-CM | POA: Diagnosis not present

## 2021-03-17 ENCOUNTER — Ambulatory Visit (HOSPITAL_BASED_OUTPATIENT_CLINIC_OR_DEPARTMENT_OTHER): Payer: BC Managed Care – PPO | Admitting: Physical Therapy

## 2021-03-20 DIAGNOSIS — L290 Pruritus ani: Secondary | ICD-10-CM | POA: Diagnosis not present

## 2021-03-20 DIAGNOSIS — K644 Residual hemorrhoidal skin tags: Secondary | ICD-10-CM | POA: Diagnosis not present

## 2021-03-20 DIAGNOSIS — K648 Other hemorrhoids: Secondary | ICD-10-CM | POA: Diagnosis not present

## 2021-03-20 DIAGNOSIS — K5909 Other constipation: Secondary | ICD-10-CM | POA: Diagnosis not present

## 2021-03-20 DIAGNOSIS — Z1211 Encounter for screening for malignant neoplasm of colon: Secondary | ICD-10-CM | POA: Diagnosis not present

## 2021-03-20 DIAGNOSIS — K5900 Constipation, unspecified: Secondary | ICD-10-CM | POA: Diagnosis not present

## 2021-03-21 ENCOUNTER — Ambulatory Visit (HOSPITAL_BASED_OUTPATIENT_CLINIC_OR_DEPARTMENT_OTHER): Payer: BC Managed Care – PPO | Admitting: Physical Therapy

## 2021-03-24 ENCOUNTER — Ambulatory Visit (HOSPITAL_BASED_OUTPATIENT_CLINIC_OR_DEPARTMENT_OTHER): Payer: BC Managed Care – PPO | Admitting: Physical Therapy

## 2021-03-24 ENCOUNTER — Other Ambulatory Visit: Payer: Self-pay

## 2021-03-24 DIAGNOSIS — F4323 Adjustment disorder with mixed anxiety and depressed mood: Secondary | ICD-10-CM | POA: Diagnosis not present

## 2021-03-24 DIAGNOSIS — M542 Cervicalgia: Secondary | ICD-10-CM

## 2021-03-24 DIAGNOSIS — M25551 Pain in right hip: Secondary | ICD-10-CM

## 2021-03-24 DIAGNOSIS — M6281 Muscle weakness (generalized): Secondary | ICD-10-CM | POA: Diagnosis not present

## 2021-03-24 DIAGNOSIS — R293 Abnormal posture: Secondary | ICD-10-CM | POA: Diagnosis not present

## 2021-03-24 NOTE — Therapy (Addendum)
[facility] [address] [place], [place], [address] Phone: [phone]   Fax:  [phone]  Physical Therapy Treatment  Patient Details  Name: Christine Ford MRN: [id number] Date of Birth: September 02, 1961 Referring Provider (PT): Bo Merino, MD   PHYSICAL THERAPY DISCHARGE SUMMARY  Visits from Start of Care: 6  Current functional level related to goals / functional outcomes: See below   Remaining deficits: See below; continued pain and ROM deficits   Education / Equipment: Discussed posture and strengthening exercises Plan:                                                    Patient goals were partially met. Patient is being discharged due to not returning since the last visit.  ?????       Encounter Date: 03/24/2021   PT End of Session - 03/24/21 1518    Visit Number 6    Number of Visits 13    Date for PT Re-Evaluation 03/31/21    Authorization Type BC[employer]    PT Start Time 1335    PT Stop Time 1405    PT Time Calculation (min) 30 min    Activity Tolerance Patient tolerated treatment well    Behavior During Therapy WFL for tasks assessed/performed           Past Medical History:  Diagnosis Date  . Anxiety   . Arthritis   . Family history of dementia, mother, age > 69 07/28/2017  . Osteopenia   . Temporomandibular joint disorder (TMJ)     Past Surgical History:  Procedure Laterality Date  . BREAST SURGERY    . WISDOM TOOTH EXTRACTION      There were no vitals filed for this visit.   Subjective Assessment - 03/24/21 1436    Subjective Pt states the exercises were good. Pt states she did the head press against the pillow and that was better. Pt reports pain in general has remained the same even though she did not work last week.    Pertinent History Osteoporosis, sicca syndrome, DDD    Limitations Lifting    How long can you sit comfortably? n/a    How long can you stand comfortably? n/a    How long  can you walk comfortably? n/a    Patient Stated Goals Pt would like exercises for her neck and bac to offset her work postures and improve alignment    Currently in Pain? Yes    Pain Score 5     Pain Location Neck    Pain Orientation Left    Pain Descriptors / Indicators Aching    Pain Type Chronic pain    Pain Onset More than a month ago              [facility] PT Assessment - 03/24/21 0001      AROM   Cervical - Right Side Bend 40    Cervical - Left Side Bend 30                         [facility] Adult PT Treatment/Exercise - 03/24/21 0001      Neck Exercises: Machines for Strengthening   UBE (Upper Arm Bike) L1 3 min forward, 3 min backward   cues for cervical bracing; noted increased levator activity  Neck Exercises: Standing   Other Standing Exercises Lateral bending 3x10 sec bilat      Manual Therapy   Manual Therapy Joint mobilization    Joint Mobilization L rotation grade III mob along C5-C6. Side gliding and "bridging" grade II to III    Soft tissue mobilization TPR and STW bilat upper trap, levators and scalenes      Neck Exercises: Stretches   Upper Trapezius Stretch Right;Left;30 seconds    Levator Stretch Right;Left;30 seconds    Other Neck Stretches Neck lateral flexion 2x20 sec bilat                  PT Education - 03/24/21 1508    Education Details Discussed modifying HEP to shoulder strengthening without using neck muscles    Person(s) Educated Patient    Methods Explanation;Handout;Demonstration    Comprehension Verbalized understanding;Returned demonstration;Need further instruction            PT Short Term Goals - 02/17/21 1046      PT SHORT TERM GOAL #1   Title STG = LTGs             PT Long Term Goals - 02/17/21 1047      PT LONG TERM GOAL #1   Title Pt will be independent with strengthening exercises for home and in the gym    Time 6    Period Weeks    Status New    Target Date 03/31/21      PT LONG TERM  GOAL #2   Title Pt will be able to maintain deep neck flexor hold without compensation for at least 30 sec    Time 6    Period Weeks    Status New    Target Date 03/31/21      PT LONG TERM GOAL #3   Title Pt will report >/= 50% reduction of her neck pain    Baseline Constant pain with difficulty sleeping    Time 6    Period Weeks    Status New    Target Date 03/31/21      PT LONG TERM GOAL #4   Title Pt will have cervical AROM = cervical PROM for improved neck mobility    Baseline PROM WFL; cervical AROM limited in all directions except flexion/extension    Time 6    Period Weeks    Status New    Target Date 03/31/21      PT LONG TERM GOAL #5   Title PT will assess and provide goals for R hip/lumbar spine as needed                 Plan - 03/24/21 1516    Clinical Impression Statement Pt with increasing neck strength -- pt with improving neck side flexion; however, pt with increased neck soreness from theraband exercises. Continued manual therapy and stretching. Discussed and added shoulder strengthening exercises. Pt to ensure no additional neck muscles contract with shoulder movement. Unable to practice in clinic due to pt having to leave early.    Personal Factors and Comorbidities Age;Time since onset of injury/illness/exacerbation;Profession;Past/Current Experience;Fitness    Examination-Activity Limitations Bend;Sleep    Examination-Participation Restrictions Community Activity;Occupation;Driving    Stability/Clinical Decision Making Evolving/Moderate complexity    Rehab Potential Good    PT Frequency 2x / week    PT Duration 6 weeks    PT Treatment/Interventions ADLs/Self Care Home Management;Biofeedback;Aquatic Therapy;Electrical Stimulation;Iontophoresis 54m/ml Dexamethasone;Traction;Therapeutic activities;Functional mobility training;Gait training;Therapeutic exercise;Balance training;Neuromuscular re-education;Patient/family education;Manual techniques;Passive  range of motion;Taping    PT Next Visit Plan How were shoulder/neck exercises? Continue neck strengthening (i.e. biofeedback, eccentrics, quadruped positioning). Continue scapular/midback strengthening + neuro re-ed to decrease neck muscle contraction. Consider taping as indicated    PT Home Exercise Plan Access Code [email or web address]    Consulted and Agree with Plan of Care Patient           Patient will benefit from skilled therapeutic intervention in order to improve the following deficits and impairments:  Decreased range of motion,Decreased coordination,Hypermobility,Pain,Decreased activity tolerance,Improper body mechanics,Decreased mobility,Decreased strength,Impaired sensation,Postural dysfunction  Visit Diagnosis: Abnormal posture  Cervicalgia  Muscle weakness (generalized)  Pain in right hip     Problem List Patient Active Problem List   Diagnosis Date Noted  . Sicca syndrome ([place]) 01/27/2021  . Chronic eczematoid otitis externa of both ears 11/01/2020  . Other idiopathic scoliosis, lumbar region 09/30/2018  . DDD (degenerative disc disease), cervical 05/06/2018  . Chronic left shoulder pain 05/06/2018  . History of palpitations 07/29/2017  . Osteoporosis 07/28/2017  . Family history of premature CAD 07/28/2017  . Primary insomnia 09/26/2008  . GAD (generalized anxiety disorder) 09/28/2007  . 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    [facility] [address] PT, DPT 03/24/2021, 3:19 PM  [facility] [address] [place], [place], [address] Phone: [phone]   Fax:  [phone]  Name: Ceci Taliaferro MRN: [id number] Date of Birth: 1961-01-11

## 2021-03-28 ENCOUNTER — Ambulatory Visit (HOSPITAL_BASED_OUTPATIENT_CLINIC_OR_DEPARTMENT_OTHER): Payer: BC Managed Care – PPO | Admitting: Physical Therapy

## 2021-03-28 DIAGNOSIS — F4323 Adjustment disorder with mixed anxiety and depressed mood: Secondary | ICD-10-CM | POA: Diagnosis not present

## 2021-04-04 ENCOUNTER — Ambulatory Visit (HOSPITAL_BASED_OUTPATIENT_CLINIC_OR_DEPARTMENT_OTHER): Payer: BC Managed Care – PPO | Admitting: Physical Therapy

## 2021-04-07 DIAGNOSIS — F4323 Adjustment disorder with mixed anxiety and depressed mood: Secondary | ICD-10-CM | POA: Diagnosis not present

## 2021-04-07 DIAGNOSIS — Z630 Problems in relationship with spouse or partner: Secondary | ICD-10-CM | POA: Diagnosis not present

## 2021-04-11 ENCOUNTER — Encounter: Payer: Self-pay | Admitting: Family Medicine

## 2021-04-14 ENCOUNTER — Telehealth: Payer: Self-pay

## 2021-04-14 NOTE — Telephone Encounter (Signed)
.  Patient is requesting that she get a prescription for Lexapro, stated her and Dr.Andy discussed it at her last visit, and wanted to give it a shot.

## 2021-04-15 NOTE — Telephone Encounter (Signed)
Please advise 

## 2021-04-17 ENCOUNTER — Other Ambulatory Visit: Payer: Self-pay

## 2021-04-17 MED ORDER — ESCITALOPRAM OXALATE 10 MG PO TABS: 10.0000 mg | ORAL_TABLET | Freq: Every day | ORAL | 0 refills | Status: DC

## 2021-04-17 NOTE — Telephone Encounter (Signed)
Lexapro 10 mg #90, 0 sent to pharmacy. LMOVM making patient aware to schedule 6 week f/u

## 2021-04-17 NOTE — Telephone Encounter (Signed)
Please respond, order lexapro 10mg  daily and needs OV in 6 weeks to recheck. Order 90 day supply w/o refills please .thanks

## 2021-04-18 DIAGNOSIS — F4323 Adjustment disorder with mixed anxiety and depressed mood: Secondary | ICD-10-CM | POA: Diagnosis not present

## 2021-04-18 DIAGNOSIS — Z630 Problems in relationship with spouse or partner: Secondary | ICD-10-CM | POA: Diagnosis not present

## 2021-04-24 DIAGNOSIS — F4323 Adjustment disorder with mixed anxiety and depressed mood: Secondary | ICD-10-CM | POA: Diagnosis not present

## 2021-04-25 DIAGNOSIS — M419 Scoliosis, unspecified: Secondary | ICD-10-CM | POA: Diagnosis not present

## 2021-04-25 DIAGNOSIS — M5030 Other cervical disc degeneration, unspecified cervical region: Secondary | ICD-10-CM | POA: Diagnosis not present

## 2021-04-28 ENCOUNTER — Telehealth: Payer: Self-pay

## 2021-04-28 ENCOUNTER — Other Ambulatory Visit: Payer: Self-pay

## 2021-04-28 ENCOUNTER — Ambulatory Visit: Payer: BC Managed Care – PPO | Admitting: Rheumatology

## 2021-04-28 ENCOUNTER — Encounter: Payer: Self-pay | Admitting: Rheumatology

## 2021-04-28 ENCOUNTER — Ambulatory Visit: Payer: Self-pay

## 2021-04-28 VITALS — BP 132/75 | HR 61 | Resp 15 | Ht 64.0 in | Wt 118.4 lb

## 2021-04-28 DIAGNOSIS — M542 Cervicalgia: Secondary | ICD-10-CM

## 2021-04-28 DIAGNOSIS — M5030 Other cervical disc degeneration, unspecified cervical region: Secondary | ICD-10-CM

## 2021-04-28 DIAGNOSIS — M818 Other osteoporosis without current pathological fracture: Secondary | ICD-10-CM | POA: Diagnosis not present

## 2021-04-28 DIAGNOSIS — G5701 Lesion of sciatic nerve, right lower limb: Secondary | ICD-10-CM

## 2021-04-28 DIAGNOSIS — M3500 Sicca syndrome, unspecified: Secondary | ICD-10-CM

## 2021-04-28 DIAGNOSIS — M62838 Other muscle spasm: Secondary | ICD-10-CM | POA: Diagnosis not present

## 2021-04-28 DIAGNOSIS — Z5181 Encounter for therapeutic drug level monitoring: Secondary | ICD-10-CM | POA: Diagnosis not present

## 2021-04-28 DIAGNOSIS — M7711 Lateral epicondylitis, right elbow: Secondary | ICD-10-CM

## 2021-04-28 DIAGNOSIS — M419 Scoliosis, unspecified: Secondary | ICD-10-CM | POA: Diagnosis not present

## 2021-04-28 DIAGNOSIS — M4126 Other idiopathic scoliosis, lumbar region: Secondary | ICD-10-CM

## 2021-04-28 MED ORDER — LIDOCAINE HCL 1 % IJ SOLN
0.5000 mL | INTRAMUSCULAR | Status: AC | PRN
  Administered 2021-04-28: .5 mL

## 2021-04-28 MED ORDER — TRIAMCINOLONE ACETONIDE 40 MG/ML IJ SUSP
10.0000 mg | INTRAMUSCULAR | Status: AC | PRN
  Administered 2021-04-28: 10 mg via INTRAMUSCULAR

## 2021-04-28 NOTE — Progress Notes (Signed)
Office Visit Note  Patient: Christine Ford             Date of Birth: 08/24/1961           MRN: [id number]             PCP: Willow OraAndy, Camille L, MD Referring: Willow OraAndy, Camille L, MD Visit Date: 04/28/2021 Occupation: @GUAROCC @  Subjective:  Other (Neck pain and right elbow pain)   History of Present Illness: Christine GheeKim Mccary is a 60 y.o. female with a history of degenerative disc disease and osteoporosis.  She states she has had chronic discomfort in her neck due to degenerative disc disease.  She started having increased pain and discomfort in her cervical spine in the last 2 weeks.  She has been going to physical therapy for the last 2 months.  She states she has not noticed any improvement in her cervical pain.  In fact the neck pain has been worse.  She has been having sharp discomfort in her cervical region which is radiating to the left side.  She also describes left trapezius spasm.  She has been having discomfort over the right elbow which she describes over the right lateral epicondyle region.  She also describes pain and discomfort in the right trochanteric region.  She has been seeing Dr.O'Halloran, physical therapist.  He has been working on her neck, right lateral epicondyle and the hip region.  Activities of Daily Living:  Patient reports morning stiffness for  30 minutes.   Patient Reports nocturnal pain.  Difficulty dressing/grooming: Denies Difficulty climbing stairs: Denies Difficulty getting out of chair: Denies Difficulty using hands for taps, buttons, cutlery, and/or writing: Denies  Review of Systems  Constitutional: Negative for fatigue, night sweats, weight gain and weight loss.  HENT: Positive for mouth dryness. Negative for mouth sores, trouble swallowing, trouble swallowing and nose dryness.   Eyes: Positive for dryness. Negative for pain, redness, itching and visual disturbance.  Respiratory: Negative for cough, shortness of breath and difficulty breathing.    Cardiovascular: Negative for chest pain, palpitations, hypertension, irregular heartbeat and swelling in legs/feet.  Gastrointestinal: Negative for blood in stool, constipation and diarrhea.  Endocrine: Negative for increased urination.  Genitourinary: Negative for difficulty urinating and vaginal dryness.  Musculoskeletal: Positive for arthralgias, joint pain and morning stiffness. Negative for joint swelling, myalgias, muscle weakness, muscle tenderness and myalgias.  Skin: Negative for color change, rash, hair loss, redness, skin tightness, ulcers and sensitivity to sunlight.  Allergic/Immunologic: Negative for susceptible to infections.  Neurological: Negative for dizziness, numbness, headaches, memory loss, night sweats and weakness.  Hematological: Negative for bruising/bleeding tendency and swollen glands.  Psychiatric/Behavioral: Negative for depressed mood, confusion and sleep disturbance. The patient is not nervous/anxious.     PMFS History:  Patient Active Problem List   Diagnosis Date Noted  . Sicca syndrome ([facility]) 01/27/2021  . Chronic eczematoid otitis externa of both ears 11/01/2020  . Other idiopathic scoliosis, lumbar region 09/30/2018  . DDD (degenerative disc disease), cervical 05/06/2018  . Chronic left shoulder pain 05/06/2018  . History of palpitations 07/29/2017  . Osteoporosis 07/28/2017  . Family history of premature CAD 07/28/2017  . Primary insomnia 09/26/2008  . GAD (generalized anxiety disorder) 09/28/2007  . 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 Anxiety   . Arthritis   . Family history of dementia, mother, age > 3980 07/28/2017  . Osteopenia   . Temporomandibular joint disorder (TMJ)     Family  History  Problem Relation Age of Onset  . Coronary artery disease Mother 59  . Hypertension Mother   . Osteoporosis Mother   . COPD Mother   . Alzheimer's disease Mother   . Hypertension Father   . Heart attack Father 65  .  Heart disease Father   . Glaucoma Son   . Healthy Son   . Breast cancer Other   . Lung cancer Other   . Lung cancer Maternal Aunt   . Heart disease Sister   . Healthy Son   . Lung cancer Cousin   . Breast cancer Cousin    Past Surgical History:  Procedure Laterality Date  . BREAST SURGERY    . dental implant  2022  . WISDOM TOOTH EXTRACTION     Social History   Social History Narrative   Occupation: Armed forces operational officer, 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  3 sons, ages 40, 67, and 54.  Used to exercise at the [facility] but now walks at lunch for exercise due to COVID restrictions.  Diet with no absolute restrictions but naturally decreases gluten and red meat.  History of palpitations that was worked up and found to be related to stress.  Parents smoked and so had long secondhand smoke exposure growing up.      Immunization History  Administered Date(s) Administered  . Influenza,inj,Quad PF,6+ Mos 11/22/2018, 09/15/2019  . Influenza-Unspecified 09/12/2020  . PFIZER(Purple Top)SARS-COV-2 Vaccination 03/02/2020, 03/23/2020  . PPD Test 10/22/2014, 11/05/2014  . Td 12/03/2006  . Tdap 12/19/2016  . Zoster Recombinat (Shingrix) 03/07/2021     Objective: Vital Signs: BP 132/75 (BP Location: Left Arm, Patient Position: Sitting, Cuff Size: Normal)   Pulse 61   Resp 15   Ht 5\' 4"  (1.626 m)   Wt 118 lb 6.4 oz (53.7 kg)   BMI 20.32 kg/m    Physical Exam Vitals and nursing note reviewed.  Constitutional:      Appearance: She is well-developed.  HENT:     Head: Normocephalic and atraumatic.  Eyes:     Conjunctiva/sclera: Conjunctivae normal.  Cardiovascular:     Rate and Rhythm: Normal rate and regular rhythm.     Heart sounds: Normal heart sounds.  Pulmonary:     Effort: Pulmonary effort is normal.     Breath sounds: Normal breath sounds.  Abdominal:     General: Bowel sounds are normal.     Palpations: Abdomen is soft.  Musculoskeletal:     Cervical back:  Normal range of motion.  Lymphadenopathy:     Cervical: No cervical adenopathy.  Skin:    General: Skin is warm and dry.     Capillary Refill: Capillary refill takes less than 2 seconds.  Neurological:     Mental Status: She is alert and oriented to person, place, and time.  Psychiatric:        Behavior: Behavior normal.      Musculoskeletal Exam: She had painful limited range of motion of her cervical spine.  Shoulder joints, elbow joints, wrist joints, MCPs PIPs and DIPs with good range of motion with no synovitis.  She had tenderness on palpation of right lateral epicondyle region.  She had discomfort over the right piriformis region.  Hip joints, knee joints, ankles, MTPs and PIPs with good range of motion with no synovitis.  CDAI Exam: CDAI Score: -- Patient Global: --; Provider Global: -- Swollen: --; Tender: -- Joint Exam 04/28/2021   No joint exam has been documented  for this visit   There is currently no information documented on the homunculus. Go to the Rheumatology activity and complete the homunculus joint exam.  Investigation: No additional findings.  Imaging: No results found.  Recent Labs: Lab Results  Component Value Date   WBC 4.9 03/07/2021   HGB 14.1 03/07/2021   PLT 222.0 03/07/2021   NA 138 03/07/2021   K 4.0 03/07/2021   CL 101 03/07/2021   CO2 29 03/07/2021   GLUCOSE 87 03/07/2021   BUN 18 03/07/2021   CREATININE 0.82 03/07/2021   BILITOT 0.9 03/07/2021   ALKPHOS 51 03/07/2021   AST 25 03/07/2021   ALT 17 03/07/2021   PROT 7.2 03/07/2021   ALBUMIN 4.4 03/07/2021   CALCIUM 9.4 03/07/2021   GFRAA 98 04/04/2018    Speciality Comments: No specialty comments available.  Procedures:  Trigger Point Inj  Date/Time: 04/28/2021 11:11 AM Performed by: Pollyann Savoy, MD Authorized by: Pollyann Savoy, MD   Consent Given by:  Patient Site marked: the procedure site was marked   Timeout: prior to procedure the correct patient, procedure,  and site was verified   Indications:  Muscle spasm and pain Total # of Trigger Points:  1 Location: neck   Needle Size:  27 G Approach:  Dorsal Medications #1:  0.5 mL lidocaine 1 %; 10 mg triamcinolone acetonide 40 MG/ML Patient tolerance:  Patient tolerated the procedure well with no immediate complications   Allergies: Patient has no known allergies.   Assessment / Plan:     Visit Diagnoses: Other osteoporosis without current pathological fracture - DEXA 03/14/2021: T-score  -2.2. DEXA 11/07/18 Left femoral neck -2.6, -0.9% change from previous BMD. Lumbar spine -2.3.  -I had detailed discussion with the patient regarding different treatment options and their side effects.  The wants to proceed with Fosamax.  I would wait until she gets a dental implant.  We will discuss Fosamax again at the follow-up visit.  Have given her handout on Fosamax to review.  Plan: Parathyroid hormone, intact (no Ca), Serum protein electrophoresis with reflex.  She will come in for the labs 2 weeks prior to her appointment.  Medication monitoring encounter - Plan: CBC with Differential/Platelet, COMPLETE METABOLIC PANEL WITH GFR  Neck pain -has been having severe neck pain.  She states the pain has been radiating to her left shoulder joint.  She is interested in getting an injection in the trapezial region and also in the cervical region.  Plan: XR Cervical Spine 2 or 3 views.  X-ray showed multilevel spondylosis and no radiographic progression when compared to 2019.  Most significant narrowing was noted between C4-C5 and C6-C7.  Patient was notified of x-ray results.                                                        DDD (degenerative disc disease), cervical - MRI of the C-spine on 04/19/18, which revealed multilevel degenerative disc disease and facet arthorsis.  She had C7-T1 right-sided foraminal narrowing.  She states the pain has been much worse compared to 2019.  Per her request we will schedule MRI of her  cervical spine.  She has been having left-sided radiculopathy.  She also wants to schedule an appointment with the [facility] neurosurgery.  Trapezius muscle spasm-she had left trapezius spasm.  Per her  request left trapezius region was injected with cortisone as described above.  She tolerated the procedure well.  Postprocedure instructions were given.  Other idiopathic scoliosis, lumbar region-she has chronic discomfort.  Lateral epicondylitis, right elbow-she has been having pain and discomfort over the right lateral epicondyle region.  She is been going to physical therapy.  I have given her a prescription for right tennis elbow brace.  Piriformis syndrome of right side-stretching exercises were demonstrated in the office.  She is also going for physical therapy.  Sicca syndrome ([facility]) - +ANA 1:160 NS, -Ro,-LA, -RF.  Her sicca symptoms are manageable.  Her mother has history of Sjogren's syndrome.  Orders: Orders Placed This Encounter  Procedures  . Trigger Point Inj  . XR Cervical Spine 2 or 3 views  . MR CERVICAL SPINE WO CONTRAST  . CBC with Differential/Platelet  . COMPLETE METABOLIC PANEL WITH GFR  . Parathyroid hormone, intact (no Ca)  . Serum protein electrophoresis with reflex  . AMB referral to orthopedics   No orders of the defined types were placed in this encounter.   .  Follow-Up Instructions: Return in about 3 months (around 07/29/2021) for DDD, OP.   Pollyann Savoy, MD  Note - This record has been created using Animal nutritionist.  Chart creation errors have been sought, but may not always  have been located. Such creation errors do not reflect on  the standard of medical care.

## 2021-04-28 NOTE — Patient Instructions (Signed)
Return 2weeks prior to appointment for lab work.   Alendronate Oral Tablets What is this medicine? ALENDRONATE (a LEN droe nate) slows calcium loss from bones. It treats Paget's disease and osteoporosis. It may be used in other people at risk for bone loss. This medicine may be used for other purposes; ask your health care provider or pharmacist if you have questions. COMMON BRAND NAME(S): Fosamax What should I tell my health care provider before I take this medicine? They need to know if you have any of these conditions:  bleeding disorder  cancer  dental disease  difficulty swallowing  infection (fever, chills, cough, sore throat, pain or trouble passing urine)  kidney disease  low levels of calcium or other minerals in the blood  low red blood cell counts  receiving steroids like dexamethasone or prednisone  stomach or intestine problems  trouble sitting or standing for 30 minutes  an unusual or allergic reaction to alendronate, other drugs, foods, dyes or preservatives  pregnant or trying to get pregnant  breast-feeding How should I use this medicine? Take this drug by mouth with a full glass of water. Take it as directed on the prescription label at the same time every day. Take the dose right after waking up. Do not eat or drink anything before taking it. Do not take it with any other drink except water. Do not chew or crush the tablet. After taking it, do not eat breakfast, drink, or take any other drugs or vitamins for at least 30 minutes. Sit or stand up for at least 30 minutes after you take it. Do not lie down. Keep taking it unless your health care provider tells you to stop. A special MedGuide will be given to you by the pharmacist with each prescription and refill. Be sure to read this information carefully each time. Talk to your health care provider about the use of this drug in children. Special care may be needed. Overdosage: If you think you have taken too  much of this medicine contact a poison control center or emergency room at once. NOTE: This medicine is only for you. Do not share this medicine with others. What if I miss a dose? If you take your drug once a day, skip it. Take your next dose at the scheduled time the next morning. Do not take two doses on the same day. If you take your drug once a week, take the missed dose on the morning after you remember. Do not take two doses on the same day. What may interact with this medicine?  aluminum hydroxide  antacids  aspirin  calcium supplements  drugs for inflammation like ibuprofen, naproxen, and others  iron supplements  magnesium supplements  vitamins with minerals This list may not describe all possible interactions. Give your health care provider a list of all the medicines, herbs, non-prescription drugs, or dietary supplements you use. Also tell them if you smoke, drink alcohol, or use illegal drugs. Some items may interact with your medicine. What should I watch for while using this medicine? Visit your health care provider for regular checks on your progress. It may be some time before you see the benefit from this drug. Some people who take this drug have severe bone, joint, or muscle pain. This drug may also increase your risk for jaw problems or a broken thigh bone. Tell your health care provider right away if you have severe pain in your jaw, bones, joints, or muscles. Tell you health care  provider if you have any pain that does not go away or that gets worse. Tell your dentist and dental surgeon that you are taking this drug. You should not have major dental surgery while on this drug. See your dentist to have a dental exam and fix any dental problems before starting this drug. Take good care of your teeth while on this drug. Make sure you see your dentist for regular follow-up appointments. You should make sure you get enough calcium and vitamin D while you are taking this  drug. Discuss the foods you eat and the vitamins you take with your health care provider. You may need blood work done while you are taking this drug. What side effects may I notice from receiving this medicine? Side effects that you should report to your doctor or health care provider as soon as possible  allergic reactions (skin rash, itching or hives; swelling of the face, lips, or tongue)  bone pain  heartburn (burning feeling in chest, often after eating or when lying down)  jaw pain, especially after dental work  joint pain  low calcium levels (fast heartbeat; muscle cramps or pain; pain, tingling, or numbness in the hands or feet; seizures)  muscle pain  painful or difficulty swallowing  redness, blistering, peeling, or loosening of the skin, including inside the mouth  stomach bleeding (bloody or black, tarry stools; spitting up blood or brown material that looks like coffee grounds) Side effects that usually do not require medical attention (report to your doctor or health care provider if they continue or are bothersome):  changes in taste  constipation  diarrhea  nausea This list may not describe all possible side effects. Call your doctor for medical advice about side effects. You may report side effects to FDA at 1-800-FDA-1088. Where should I keep my medicine? Keep out of the reach of children and pets. Store at room temperature between 15 and 30 degrees C (59 and 86 degrees F). Throw away any unused drug after the expiration date. NOTE: This sheet is a summary. It may not cover all possible information. If you have questions about this medicine, talk to your doctor, pharmacist, or health care provider.  2021 Elsevier/Gold Standard (2019-09-14 10:49:07)

## 2021-04-28 NOTE — Telephone Encounter (Signed)
Spoke with patient and advised patient of cervical spine x-ray results from this morning. Patient verbalized understanding.

## 2021-04-30 ENCOUNTER — Telehealth: Payer: Self-pay | Admitting: Physical Medicine and Rehabilitation

## 2021-04-30 NOTE — Telephone Encounter (Signed)
Patient returned call to [facility]. Patient states she will call back after she sees her patient.

## 2021-04-30 NOTE — Telephone Encounter (Signed)
Called pt and LVM #1 

## 2021-05-01 DIAGNOSIS — F4323 Adjustment disorder with mixed anxiety and depressed mood: Secondary | ICD-10-CM | POA: Diagnosis not present

## 2021-05-01 NOTE — Telephone Encounter (Signed)
Called pt and LVM #2 

## 2021-05-02 ENCOUNTER — Telehealth: Payer: Self-pay | Admitting: Rheumatology

## 2021-05-02 NOTE — Telephone Encounter (Signed)
I spoke with Loraine Leriche at [employer]. Patient does not meet criteria for Cspine MRI. Doctor can call [phone] for a Peer to Peer. Pending authorization # [id number]

## 2021-05-02 NOTE — Telephone Encounter (Signed)
Called pt and sch consult 5/23

## 2021-05-05 ENCOUNTER — Telehealth: Payer: Self-pay | Admitting: Rheumatology

## 2021-05-05 DIAGNOSIS — F4323 Adjustment disorder with mixed anxiety and depressed mood: Secondary | ICD-10-CM | POA: Diagnosis not present

## 2021-05-05 DIAGNOSIS — Z630 Problems in relationship with spouse or partner: Secondary | ICD-10-CM | POA: Diagnosis not present

## 2021-05-05 NOTE — Telephone Encounter (Signed)
LMOM for patient to call office regarding MRI.

## 2021-05-05 NOTE — Telephone Encounter (Signed)
Please advise patient that MRI was declined.  If she is still having neck pain with please refer her to Dr. Otelia Sergeant or Dr. Ophelia Charter.

## 2021-05-05 NOTE — Telephone Encounter (Signed)
I called patient, patient will call to schedule orthopedic appt.

## 2021-05-05 NOTE — Telephone Encounter (Signed)
Patient calling to let you know injection in her left trigger point helped tremendously. Patient having no pain now. It took about 1 week for it all to go away.

## 2021-05-08 DIAGNOSIS — F4323 Adjustment disorder with mixed anxiety and depressed mood: Secondary | ICD-10-CM | POA: Diagnosis not present

## 2021-05-16 DIAGNOSIS — D125 Benign neoplasm of sigmoid colon: Secondary | ICD-10-CM | POA: Diagnosis not present

## 2021-05-16 DIAGNOSIS — K635 Polyp of colon: Secondary | ICD-10-CM | POA: Diagnosis not present

## 2021-05-16 DIAGNOSIS — Z1211 Encounter for screening for malignant neoplasm of colon: Secondary | ICD-10-CM | POA: Diagnosis not present

## 2021-05-19 ENCOUNTER — Encounter: Payer: Self-pay | Admitting: Physical Medicine and Rehabilitation

## 2021-05-19 ENCOUNTER — Ambulatory Visit: Payer: BC Managed Care – PPO | Admitting: Family Medicine

## 2021-05-19 ENCOUNTER — Encounter: Payer: Self-pay | Admitting: Family Medicine

## 2021-05-19 ENCOUNTER — Other Ambulatory Visit: Payer: Self-pay

## 2021-05-19 ENCOUNTER — Ambulatory Visit: Payer: BC Managed Care – PPO | Admitting: Physical Medicine and Rehabilitation

## 2021-05-19 VITALS — BP 120/81 | HR 74

## 2021-05-19 VITALS — BP 124/78 | HR 67 | Temp 98.1°F | Ht 64.0 in | Wt 114.6 lb

## 2021-05-19 DIAGNOSIS — F411 Generalized anxiety disorder: Secondary | ICD-10-CM | POA: Diagnosis not present

## 2021-05-19 DIAGNOSIS — Z8249 Family history of ischemic heart disease and other diseases of the circulatory system: Secondary | ICD-10-CM

## 2021-05-19 DIAGNOSIS — M4802 Spinal stenosis, cervical region: Secondary | ICD-10-CM | POA: Diagnosis not present

## 2021-05-19 DIAGNOSIS — M5030 Other cervical disc degeneration, unspecified cervical region: Secondary | ICD-10-CM

## 2021-05-19 DIAGNOSIS — M47816 Spondylosis without myelopathy or radiculopathy, lumbar region: Secondary | ICD-10-CM

## 2021-05-19 DIAGNOSIS — M542 Cervicalgia: Secondary | ICD-10-CM | POA: Diagnosis not present

## 2021-05-19 DIAGNOSIS — M7918 Myalgia, other site: Secondary | ICD-10-CM

## 2021-05-19 DIAGNOSIS — Z87898 Personal history of other specified conditions: Secondary | ICD-10-CM | POA: Diagnosis not present

## 2021-05-19 NOTE — Patient Instructions (Signed)
Please follow up if symptoms do not improve or as needed.   Continue the lexapro at 10mg  for a total of about 3 months; then monitor to see if a lower dose is needed. Request a refill when you are due.   Thanks!

## 2021-05-19 NOTE — Progress Notes (Signed)
Pt state neck pain that travel to her left shoulder. Pt state holding up her head makes the pain worse. Pt state she take over the counter pain meds to help ease her pain. Pt state she use heating pads and tried PT before to help ease her pain.   Numeric Pain Rating Scale and Functional Assessment Average Pain 10 Pain Right Now 2 My pain is constant, burning and stabbing Pain is worse with: some activites Pain improves with: heat/ice, therapy/exercise and medication   In the last MONTH (on 0-10 scale) has pain interfered with the following?  1. General activity like being  able to carry out your everyday physical activities such as walking, climbing stairs, carrying groceries, or moving a chair?  Rating(8)  2. Relation with others like being able to carry out your usual social activities and roles such as  activities at home, at work and in your community. Rating(9)  3. Enjoyment of life such that you have  been bothered by emotional problems such as feeling anxious, depressed or irritable?  Rating(10)

## 2021-05-19 NOTE — Progress Notes (Signed)
Subjective  CC:  Chief Complaint  Patient presents with  . Anxiety    Following up on Lexapro    HPI: Christine Ford is a 60 y.o. female who presents to the office today to address the problems listed above in the chief complaint.  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  She had been on Lexapro for few years for anxiety symptoms.  She stopped back in January due to feeling like she was not quite as sharp as she had been.  She is a very busy person.  She helps her 3 grandsons who are all now in college.  Life has become a bit hectic.  1 had a medical procedure done that was stressful.  For these reasons, she restarted her Lexapro and is now feeling better.  She reports she gets the feeling of being overwhelmed and cannot keep up.  Medicines help resolve this.  She denies current adverse effects.  She prefers not to take medication if possible and wonders if she can take it on an as-needed basis.  No panic attacks.  No depressive symptoms.  No apathy.  History of palpitations with a negative work-up including echocardiogram in the past.  Over the last 6 months she has had 2 or 3 episodes of a heart flutter.  This is described as a palpitation that is brief in nature.  It is not associated with shortness of breath, chest pain, edema.  Symptoms are short-lived.  She does have a family history of premature cardiovascular disease.  She would like to know if she needs a cardiologist.  She feels well today.  Of note, she did have her COVID vaccines in the last year and it was after this that she noted the palpitations.  Lab Results  Component Value Date   CHOL 195 03/07/2021   HDL 80.40 03/07/2021   LDLCALC 102 (H) 03/07/2021   TRIG 63.0 03/07/2021   CHOLHDL 2 03/07/2021   The 10-year ASCVD risk score Denman George DC Jr., et al., 2013) is: 2.1%   Values used to calculate the score:     Age: 47 years     Sex: Female     Is  Non-Hispanic African American: No     Diabetic: No     Tobacco smoker: No     Systolic Blood Pressure: 124 mmHg     Is BP treated: No     HDL Cholesterol: 80.4 mg/dL     Total Cholesterol: 195 mg/dL  Assessment  1. GAD (generalized anxiety disorder)   2. History of palpitations   3. Family history of premature CAD      Plan   Generalized anxiety disorder: Counseling done.  Agree with the use of medications.  Lexapro is working well.  Continue 10 mg daily for total of 3 months then reevaluate.  If feeling like it is affecting her concentration or ability to multitask, we can consider decreasing the dose to 5 mg daily.  Patient understands and agrees with care plan.  Symptoms are improved.  Palpitations: Reassured.  No red flag symptoms noted.  We will continue to monitor.  If increase in frequency, duration, severity or associated with other cardiac related symptoms, she will return.  Patient understands and agrees with care plan.  Follow up: March for complete physical or as needed 07/17/2021  No orders of the defined types were placed in this encounter.  No orders of the defined types were  placed in this encounter.     I reviewed the patients updated PMH, FH, and SocHx.    Patient Active Problem List   Diagnosis Date Noted  . DDD (degenerative disc disease), cervical 05/06/2018    Priority: High  . Osteoporosis 07/28/2017    Priority: High  . Primary insomnia 09/26/2008    Priority: High  . GAD (generalized anxiety disorder) 09/28/2007    Priority: High  . Sicca syndrome (HCC) 01/27/2021    Priority: Medium  . Chronic left shoulder pain 05/06/2018    Priority: Medium  . Family history of premature CAD 07/28/2017    Priority: Medium  . Other idiopathic scoliosis, lumbar region 09/30/2018    Priority: Low  . History of palpitations 07/29/2017    Priority: Low  . 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 Cholecalciferol (VITAMIN D3) 1000 units CAPS   . escitalopram (LEXAPRO) 10 MG tablet Take 1 tablet (10 mg total) by mouth daily.  . Multiple Vitamins-Minerals (MULTIVITAMIN,TX-MINERALS) tablet Take 1 tablet by mouth daily.    Allergies: Patient has No Known Allergies. Family History: Patient family history includes Alzheimer's disease in her mother; Breast cancer in her cousin and another family member; COPD in her mother; Coronary artery disease (age of onset: 72) in her mother; Glaucoma in her son; Healthy in her son and son; Heart attack (age of onset: 51) in her father; Heart disease in her father and sister; Hypertension in her father and mother; Lung cancer in her cousin, maternal aunt, and another family member; Osteoporosis in her mother. Social History:  Patient  reports that she has never smoked. She has never used smokeless tobacco. She reports current alcohol use. She reports that she does not use drugs.  Review of Systems: Constitutional: Negative for fever malaise or anorexia Cardiovascular: negative for chest pain Respiratory: negative for SOB or persistent cough Gastrointestinal: negative for abdominal pain  Objective  Vitals: BP 124/78   Pulse 67   Temp 98.1 F (36.7 C) (Temporal)   Ht 5\' 4"  (1.626 m)   Wt 114 lb 9.6 oz (52 kg)   SpO2 98%   BMI 19.67 kg/m  General: no acute distress , A&Ox3 Psych: Normal affect, normal speech, good insight Cor regular rate and rhythm     Commons side effects, risks, benefits, and alternatives for medications and treatment plan prescribed today were discussed, and the patient expressed understanding of the given instructions. Patient is instructed to call or message via MyChart if he/she has any questions or concerns regarding our treatment plan. No barriers to understanding were identified. We discussed Red Flag symptoms and signs in detail. Patient expressed understanding regarding what to do  in case of urgent or emergency type symptoms.   Medication list was reconciled, printed and provided to the patient in AVS. Patient instructions and summary information was reviewed with the patient as documented in the AVS. This note was prepared with assistance of Dragon voice recognition software. Occasional wrong-word or sound-a-like substitutions may have occurred due to the inherent limitations of voice recognition software  This visit occurred during the SARS-CoV-2 public health emergency.  Safety protocols were in place, including screening questions prior to the visit, additional usage of staff PPE, and extensive cleaning of exam room while observing appropriate contact time as indicated for disinfecting solutions.

## 2021-05-20 ENCOUNTER — Encounter: Payer: Self-pay | Admitting: Physical Medicine and Rehabilitation

## 2021-05-20 DIAGNOSIS — M47816 Spondylosis without myelopathy or radiculopathy, lumbar region: Secondary | ICD-10-CM | POA: Insufficient documentation

## 2021-05-20 DIAGNOSIS — M7918 Myalgia, other site: Secondary | ICD-10-CM | POA: Insufficient documentation

## 2021-05-20 NOTE — Progress Notes (Signed)
Christine Ford - 60 y.o. female MRN [id number]  Date of birth: 22-Aug-1961  Office Visit Note: Visit Date: 05/19/2021 PCP: Willow Ora, MD Referred by: Willow Ora, MD  Subjective: Chief Complaint  Patient presents with  . Neck - Pain  . Left Shoulder - Pain   HPI: Christine Ford is a 60 y.o. female who comes in today At the request of Dr. Melvenia Needles for evaluation management of chronic worsening severe neck pain and upper back pain.  Patient reports a chronic long-term history of neck pain over the years with intermittent exacerbations.  She reports that in early April of this year she had had several car trips and different stressors and began having this left neck pain referring into the trapezius muscle that was quite severe and she could not get comfortable and it was literally 10 out of 10 pain and did not respond any medications including muscle relaxer over-the-counter medications heat change position etc.  At that time she saw Dr. Corliss Skains who referred her here to this clinic but also provided a localized essentially trigger point muscle injection into the trapezius on the left and she reports dramatic relief with that injection.  Patient does endorse many years of being told she had degenerative disks and has had x-ray and MRI.  MRI was from 2019 and is reviewed with her and reviewed below in the notes.  Did show multilevel degenerative changes and facet arthropathy but no high-grade central stenosis or disc herniations there was some right-sided foraminal narrowing.  She said at one point she did have some right-sided pain.  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  Her case is complicated by history of generalized anxiety as well as history of TMJ.  She reports that no one is really mention trigger points to her or myofascial pain.  She has had physical therapy at least on one occasion that actually made the symptoms worse.  She is  currently seeing on an as-needed basis Dr. Ellamae Sia who is one of the better physical therapist in town.  She reports that he does some adjustments and posture work and that this does seem to help in general.  She has not had any dry needling performed.  She denies any symptoms down the arms or paresthesias although at 1 point she gets some tingling in the hands.  No prior history of carpal tunnel syndrome that she knows of.  No prior electrodiagnostic studies.  Recent x-ray taken by Dr. Corliss Skains also reviewed with her today and reviewed below.  Basically showed degenerative changes with facet arthropathy particular of the upper cervical region as far as facet arthropathy goes and then degenerative disc in the lower area.  We did have a long discussion today and I spoke with her 30 minutes about spine disorders and the true nature of what people refer to his degenerative disc disease which is not really a disease that can be genetic.  We also talked about myofascial pain and trigger points in the treatment.  Review of Systems  Musculoskeletal: Positive for back pain and neck pain.  All other systems reviewed and are negative.  Otherwise per HPI.  Assessment & Plan: Visit Diagnoses:    ICD-10-CM   1. Cervicalgia  M54.2   2. Spondylosis without myelopathy or radiculopathy, lumbar region  M47.816   3. Foraminal stenosis of cervical region  M48.02   4. DDD (degenerative disc disease), cervical  M50.30  5. Myofascial pain syndrome  M79.18      Plan: Findings:  Chronic history of severe neck pain chronic neck pain all the time but with intermittent flareups with most recent flareup at the end of April early May that actually was relieved with what essentially was a trigger point injection by Dr. Corliss Skains.  Exam today is very consistent with myofascial pain and trigger points particular in the levator scapula trapezius and rhomboid and infraspinatus.  We did review images today she does have  degenerative disc as well as facet arthropathy particular in the upper cervical region.  I discussed with her that a lot of her complaints are multifactorial given that she likely does have some neck pain at times from the facet arthropathy and her job but that can also exacerbate the muscular component of this myofascial pain.  We talked about people with trigger points and relationship to anxiety and stress and she does hold a lot of stress in the shoulders and upper back.  I have found these trigger points to be very active in folks that are sort of on edge as a general state.  Which goes along with increased anxiety.  I do feel like what she complained of and most of her pain really is the the muscles more than the cervical spine itself.  She has had a fairly recent MRI and I do not think there is any sign right now to get 1.  If her symptoms returned and they just were not getting better with conservative care and trigger point injection that I do think MRI of the cervical spine would be warranted to see if there is any change from a disc standpoint.  She likely would not have developed any bony arthritis at this point given the findings of the prior MRI.  She does talk about potentially changing her job and I think if she is able to do that it probably would help from a pain standpoint of her posture and what she has to do at work as a Armed forces operational officer.  At this point my recommendations are to continue with Dr. Clyda Hurdle I did give her some strengthening exercises which are isometric exercises which will help while its painful but may help prevent some flareups.  We also talked about getting her neck past 90 degrees laying backwards with the support of her head at times during the day if she can this may take some of the stress off.  We also talked about dry needling and trigger point injections which I am happy to provide as well if it flares up.  I also gave her some information on a deep tissue massage  therapist has had good success with some trigger point management.  We will see her back as needed depending on how she does currently.    Meds & Orders: No orders of the defined types were placed in this encounter.  No orders of the defined types were placed in this encounter.   Follow-up: Return if symptoms worsen or fail to improve.   Procedures: No procedures performed      Clinical History: 04/28/2021 Cspine Xray Multilevel spondylosis was noted. Anterior spurring was noted. Most  significant narrowing was noted between C4-C5, and C6-C7. Facet joint  arthropathy was noted. No radiographic progression was noted when  compared to the x-rays of 2019.   Impression: These findings are consistent with multilevel spondylosis and  facet joint arthropathy. Dr. Pollyann Savoy ----- Jeanne Ivan, MD - 04/19/2018  Formatting of this note might be different from the original.  TECHNIQUE: Multiplanar, multisequence MR imaging of the cervical spine was performed without contrast   COMPARISON: None   INDICATION: Cervicalgia   FINDINGS:  . Bones: Vertebral body heights are maintained. No marrow edema or focal bone lesion  . Spinal cord: Normal size, contour, and signal without evidence of mass or demyelinating lesion  . Alignment: Anatomic without subluxation.  . Degenerative changes:  . C1-C2: No significant central canal or neural foraminal stenosis.  . C2-C3: Mild left facet arthrosis and bony foraminal stenosis.  . C3-C4: Moderate bilateral facet arthrosis and mild bilateral bony foraminal stenosis.  Marland Kitchen C4-C5: Small broad-based disc osteophyte complex with uncovertebral joint spurring. Moderate left and mild right foraminal stenosis. Mild central canal stenosis.  Marland Kitchen C5-C6: Broad-based disc osteophyte complex with uncovertebral joint spurring. Moderate right and mild left foraminal stenosis. Mild central canal stenosis.  Marland Kitchen C6-C7: Mild bilateral uncovertebral joint spurring.  Minimal effacement of the ventral thecal sac.  . C7-T1: Small broad-based disc osteophyte complex. Minimal effacement of the ventral thecal sac. Moderately severe right foraminal stenosis secondary to disc osteophyte complex. Correlate for right C8 radiculopathy.  . Additional findings: None     IMPRESSION:  Multilevel degenerative disc disease and facet arthrosis. Moderately severe right foraminal stenosis at C7-T1. Correlate for right radiculopathy.   Additional levels of mild central canal and/or foraminal stenosis as above   Electronically Signed by: Jeanne Ivan Exam End: 04/18/18 12:50 PM   She reports that she has never smoked. She has never used smokeless tobacco.  Recent Labs    09/20/20 1526  HGBA1C 5.0    Objective:  VS:  HT:    WT:   BMI:     BP:120/81  HR:74bpm  TEMP: ( )  RESP:  Physical Exam Vitals and nursing note reviewed.  Constitutional:      General: She is not in acute distress.    Appearance: Normal appearance. She is not ill-appearing.  HENT:     Head: Normocephalic and atraumatic.     Right Ear: External ear normal.     Left Ear: External ear normal.  Eyes:     Extraocular Movements: Extraocular movements intact.  Cardiovascular:     Rate and Rhythm: Normal rate.     Pulses: Normal pulses.  Musculoskeletal:     Cervical back: Tenderness present. No rigidity.     Right lower leg: No edema.     Left lower leg: No edema.     Comments: Patient has good strength in the upper extremities including 5 out of 5 strength in wrist extension long finger flexion and APB.  There is no atrophy of the hands intrinsically.  There is a negative Hoffmann's test.  She has positive trigger points in the bilateral trapezius levator scapula rhomboid and infraspinatus.  She does sit with a forward flexed cervical spine.  She has some limited range of motion at end ranges right and left.   Lymphadenopathy:     Cervical: No cervical adenopathy.  Skin:    Findings:  No erythema, lesion or rash.  Neurological:     General: No focal deficit present.     Mental Status: She is alert and oriented to person, place, and time.     Cranial Nerves: No cranial nerve deficit.     Sensory: No sensory deficit.     Motor: No weakness or abnormal muscle tone.     Coordination: Coordination normal.  Gait: Gait normal.  Psychiatric:        Mood and Affect: Mood normal.        Behavior: Behavior normal.     Ortho Exam  Imaging: No results found.  Past Medical/Family/Surgical/Social History: Medications & Allergies reviewed per EMR, new medications updated. Patient Active Problem List   Diagnosis Date Noted  . Myofascial pain syndrome 05/20/2021  . Spondylosis without myelopathy or radiculopathy, lumbar region 05/20/2021  . Sicca syndrome (HCC) 01/27/2021  . Chronic eczematoid otitis externa of both ears 11/01/2020  . Other idiopathic scoliosis, lumbar region 09/30/2018  . DDD (degenerative disc disease), cervical 05/06/2018  . Chronic left shoulder pain 05/06/2018  . History of palpitations 07/29/2017  . Osteoporosis 07/28/2017  . Family history of premature CAD 07/28/2017  . Primary insomnia 09/26/2008  . GAD (generalized anxiety disorder) 09/28/2007  . 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 Anxiety   . Arthritis   . Family history of dementia, mother, age > 3780 07/28/2017  . Osteopenia   . Temporomandibular joint disorder (TMJ)    Family History  Problem Relation Age of Onset  . Coronary artery disease Mother 3550  . Hypertension Mother   . Osteoporosis Mother   . COPD Mother   . Alzheimer's disease Mother   . Hypertension Father   . Heart attack Father [id number]  . Heart disease Father   . Glaucoma Son   . Healthy Son   . Breast cancer Other   . Lung cancer Other   . Lung cancer Maternal Aunt   . Heart disease Sister   . Healthy Son   . Lung cancer Cousin   . Breast cancer Cousin    Past Surgical History:   Procedure Laterality Date  . BREAST SURGERY    . dental implant  2022  . WISDOM TOOTH EXTRACTION     Social History   Occupational History  . Occupation: dental hygenist    Comment: [employer]  Tobacco Use  . Smoking status: Never Smoker  . Smokeless tobacco: Never Used  . Tobacco comment: Passive smoke exposure   Vaping Use  . Vaping Use: Never used  Substance and Sexual Activity  . Alcohol use: Yes    Comment: socially  . Drug use: No  . Sexual activity: Yes    Partners: Male    Birth control/protection: Post-menopausal

## 2021-05-23 DIAGNOSIS — F4323 Adjustment disorder with mixed anxiety and depressed mood: Secondary | ICD-10-CM | POA: Diagnosis not present

## 2021-06-13 DIAGNOSIS — M419 Scoliosis, unspecified: Secondary | ICD-10-CM | POA: Diagnosis not present

## 2021-06-13 DIAGNOSIS — M5030 Other cervical disc degeneration, unspecified cervical region: Secondary | ICD-10-CM | POA: Diagnosis not present

## 2021-06-20 DIAGNOSIS — L810 Postinflammatory hyperpigmentation: Secondary | ICD-10-CM | POA: Diagnosis not present

## 2021-06-20 DIAGNOSIS — R234 Changes in skin texture: Secondary | ICD-10-CM | POA: Diagnosis not present

## 2021-07-15 ENCOUNTER — Ambulatory Visit: Payer: BC Managed Care – PPO

## 2021-07-16 ENCOUNTER — Other Ambulatory Visit: Payer: Self-pay | Admitting: Family Medicine

## 2021-07-17 ENCOUNTER — Ambulatory Visit: Payer: BC Managed Care – PPO

## 2021-07-21 ENCOUNTER — Ambulatory Visit: Payer: BC Managed Care – PPO | Admitting: Rheumatology

## 2021-08-07 ENCOUNTER — Other Ambulatory Visit: Payer: Self-pay

## 2021-08-07 ENCOUNTER — Ambulatory Visit (INDEPENDENT_AMBULATORY_CARE_PROVIDER_SITE_OTHER): Payer: BC Managed Care – PPO

## 2021-08-07 DIAGNOSIS — Z23 Encounter for immunization: Secondary | ICD-10-CM

## 2021-08-22 DIAGNOSIS — J341 Cyst and mucocele of nose and nasal sinus: Secondary | ICD-10-CM | POA: Diagnosis not present

## 2021-08-22 DIAGNOSIS — J343 Hypertrophy of nasal turbinates: Secondary | ICD-10-CM | POA: Diagnosis not present

## 2021-09-02 NOTE — Progress Notes (Deleted)
Office Visit Note  Patient: Christine Ford             Date of Birth: November 14, 1961           MRN: [id number]             PCP: Willow Ora, MD Referring: Willow Ora, MD Visit Date: 09/15/2021 Occupation: @GUAROCC @  Subjective:  No chief complaint on file.   History of Present Illness: Christine Ford is a 60 y.o. female ***   Activities of Daily Living:  Patient reports morning stiffness for *** {minute/hour:19697}.   Patient {ACTIONS;DENIES/REPORTS:21021675::"Denies"} nocturnal pain.  Difficulty dressing/grooming: {ACTIONS;DENIES/REPORTS:21021675::"Denies"} Difficulty climbing stairs: {ACTIONS;DENIES/REPORTS:21021675::"Denies"} Difficulty getting out of chair: {ACTIONS;DENIES/REPORTS:21021675::"Denies"} Difficulty using hands for taps, buttons, cutlery, and/or writing: {ACTIONS;DENIES/REPORTS:21021675::"Denies"}  No Rheumatology ROS completed.   PMFS History:  Patient Active Problem List   Diagnosis Date Noted   Myofascial pain syndrome 05/20/2021   Spondylosis without myelopathy or radiculopathy, lumbar region 05/20/2021   Sicca syndrome (HCC) 01/27/2021   Chronic eczematoid otitis externa of both ears 11/01/2020   Other idiopathic scoliosis, lumbar region 09/30/2018   DDD (degenerative disc disease), cervical 05/06/2018   Chronic left shoulder pain 05/06/2018   History of palpitations 07/29/2017   Osteoporosis 07/28/2017   Family history of premature CAD 07/28/2017   Primary insomnia 09/26/2008   GAD (generalized anxiety disorder) 09/28/2007   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    Past Medical History:  Diagnosis Date   Anxiety    Arthritis    Family history of dementia, mother, age > 48 07/28/2017   Osteopenia    Temporomandibular joint disorder (TMJ)     Family History  Problem Relation Age of Onset   Coronary artery disease Mother 48   Hypertension Mother    Osteoporosis Mother    COPD Mother    Alzheimer's disease Mother    Hypertension Father    Heart  attack Father 42   Heart disease Father    Glaucoma Son    Healthy Son    Breast cancer Other    Lung cancer Other    Lung cancer Maternal Aunt    Heart disease Sister    Healthy Son    Lung cancer Cousin    Breast cancer Cousin    Past Surgical History:  Procedure Laterality Date   BREAST SURGERY     dental implant  2022   WISDOM TOOTH EXTRACTION     Social History   Social History Narrative   Occupation: 2023, 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  3 sons, ages 18, 91, and 5.  Used to exercise at the [facility] but now walks at lunch for exercise due to COVID restrictions.  Diet with no absolute restrictions but naturally decreases gluten and red meat.  History of palpitations that was worked up and found to be related to stress.  Parents smoked and so had long secondhand smoke exposure growing up.      Immunization History  Administered Date(s) Administered   Influenza,inj,Quad PF,6+ Mos 11/22/2018, 09/15/2019   Influenza-Unspecified 09/12/2020   PFIZER(Purple Top)SARS-COV-2 Vaccination 03/02/2020, 03/23/2020   PPD Test 10/22/2014, 11/05/2014   Td 12/03/2006   Tdap 12/19/2016   Zoster Recombinat (Shingrix) 03/07/2021, 08/07/2021     Objective: Vital Signs: There were no vitals taken for this visit.   Physical Exam   Musculoskeletal Exam: ***  CDAI Exam: CDAI Score: -- Patient Global: --; Provider Global: -- Swollen: --; Tender: -- Joint Exam 09/15/2021   No  joint exam has been documented for this visit   There is currently no information documented on the homunculus. Go to the Rheumatology activity and complete the homunculus joint exam.  Investigation: No additional findings.  Imaging: No results found.  Recent Labs: Lab Results  Component Value Date   WBC 4.9 03/07/2021   HGB 14.1 03/07/2021   PLT 222.0 03/07/2021   NA 138 03/07/2021   K 4.0 03/07/2021   CL 101 03/07/2021   CO2 29 03/07/2021   GLUCOSE 87 03/07/2021   BUN 18  03/07/2021   CREATININE 0.82 03/07/2021   BILITOT 0.9 03/07/2021   ALKPHOS 51 03/07/2021   AST 25 03/07/2021   ALT 17 03/07/2021   PROT 7.2 03/07/2021   ALBUMIN 4.4 03/07/2021   CALCIUM 9.4 03/07/2021   GFRAA 98 04/04/2018    Speciality Comments: No specialty comments available.  Procedures:  No procedures performed Allergies: Patient has no known allergies.   Assessment / Plan:     Visit Diagnoses: No diagnosis found.  Orders: No orders of the defined types were placed in this encounter.  No orders of the defined types were placed in this encounter.   Face-to-face time spent with patient was *** minutes. Greater than 50% of time was spent in counseling and coordination of care.  Follow-Up Instructions: No follow-ups on file.   Ellen Henri, CMA  Note - This record has been created using Animal nutritionist.  Chart creation errors have been sought, but may not always  have been located. Such creation errors do not reflect on  the standard of medical care.

## 2021-09-15 ENCOUNTER — Ambulatory Visit: Payer: BC Managed Care – PPO | Admitting: Rheumatology

## 2021-09-15 DIAGNOSIS — M5030 Other cervical disc degeneration, unspecified cervical region: Secondary | ICD-10-CM

## 2021-09-15 DIAGNOSIS — M62838 Other muscle spasm: Secondary | ICD-10-CM

## 2021-09-15 DIAGNOSIS — M818 Other osteoporosis without current pathological fracture: Secondary | ICD-10-CM

## 2021-09-15 DIAGNOSIS — M3500 Sicca syndrome, unspecified: Secondary | ICD-10-CM

## 2021-09-15 DIAGNOSIS — M4126 Other idiopathic scoliosis, lumbar region: Secondary | ICD-10-CM

## 2021-09-15 DIAGNOSIS — M542 Cervicalgia: Secondary | ICD-10-CM

## 2021-09-15 DIAGNOSIS — Z5181 Encounter for therapeutic drug level monitoring: Secondary | ICD-10-CM

## 2021-09-15 DIAGNOSIS — G5701 Lesion of sciatic nerve, right lower limb: Secondary | ICD-10-CM

## 2021-09-15 DIAGNOSIS — M7711 Lateral epicondylitis, right elbow: Secondary | ICD-10-CM

## 2021-09-23 IMAGING — CT CT HEART SCORING
2 series · 16 of 20 positions shown, 18 images · non-contrast
Comparison: None.
COMPARISON: None.

Addendum:
EXAM:
OVER-READ INTERPRETATION  CT CHEST

The following report is an over-read performed by radiologist Dr.
Pirategabe Gansperger [REDACTED] on 09/22/2019. This
over-read does not include interpretation of cardiac or coronary
anatomy or pathology. The coronary calcium score interpretation by
the cardiologist is attached.
CLINICAL DATA: Risk stratification
Coronary Calcium Score
TECHNIQUE: The patient was scanned on a Siemens Somatom 64 slice scanner. Axial
non-contrast 3 mm slices were carried out through the heart. The
data set was analyzed on a dedicated work station and scored using
the Agatson method.

[Series 2: casc 3.0 i36f 2 bestdiast 68 % · axial · 0.27mm/px · z∈[-241,-124]mm · 8 of 51 slices shown, 10 images]
[im 6/51  vessel]
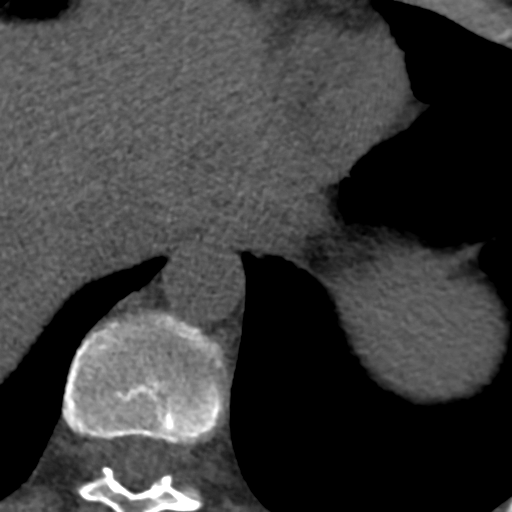
[im 6/51  lung]
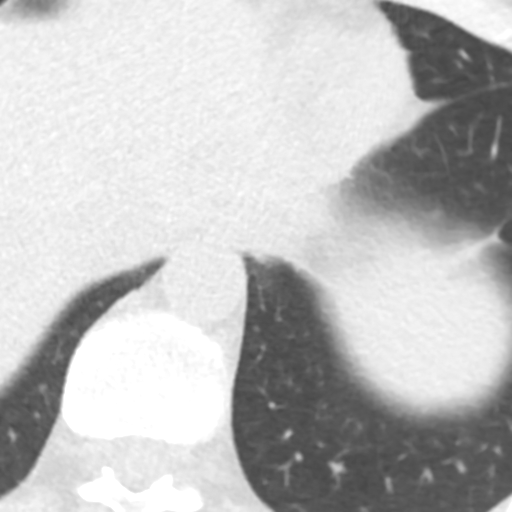
[im 12/51  vessel]
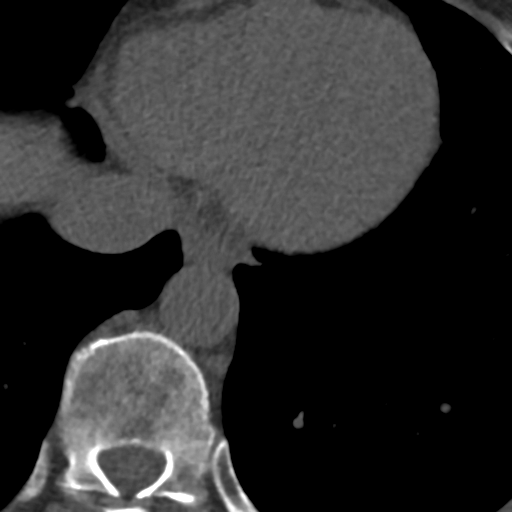
[im 17/51  vessel]
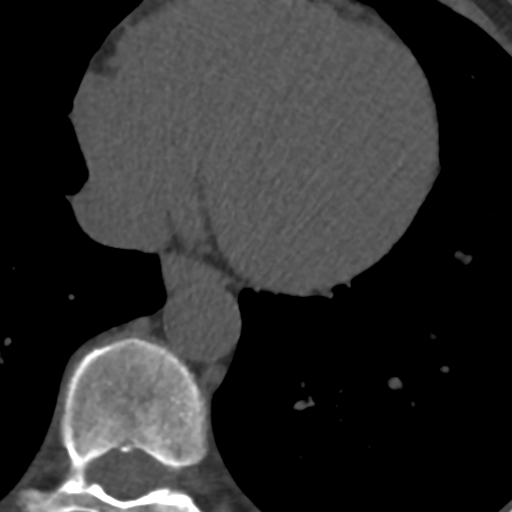
[im 23/51  vessel]
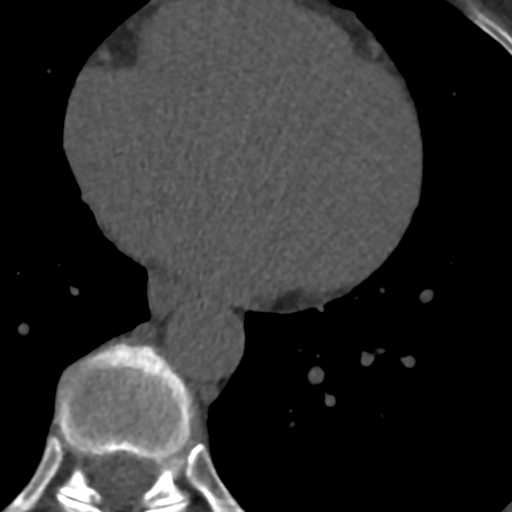
[im 28/51  vessel]
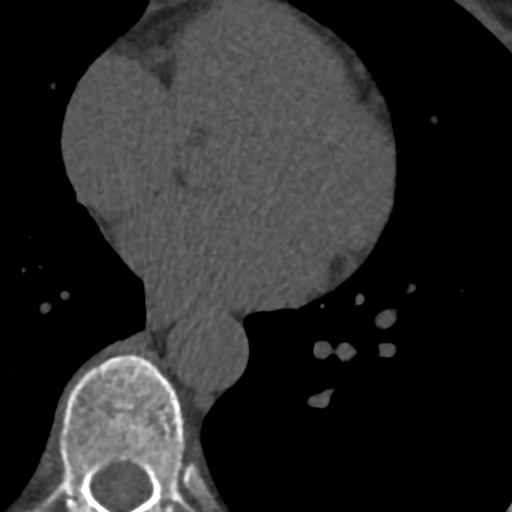
[im 28/51  lung]
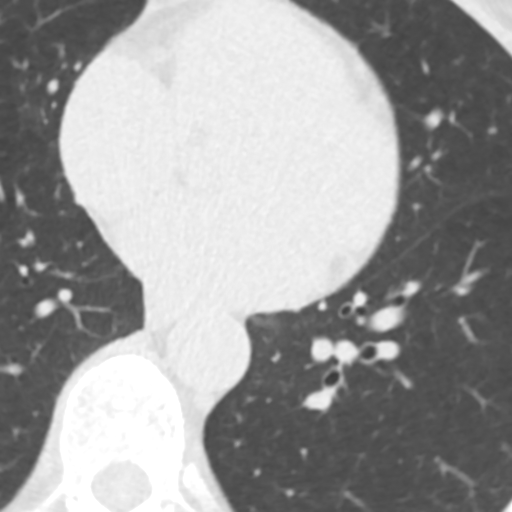
[im 34/51  vessel]
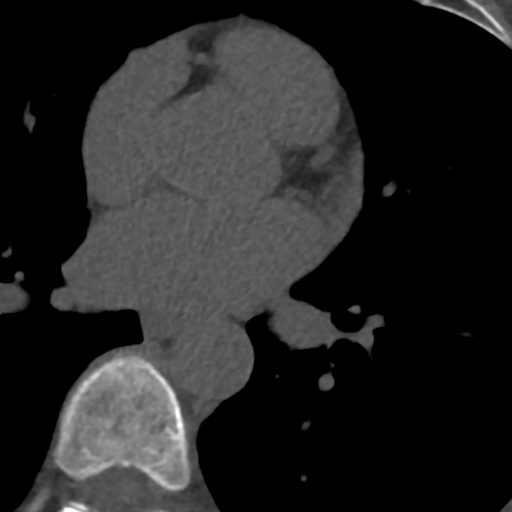
[im 39/51  vessel]
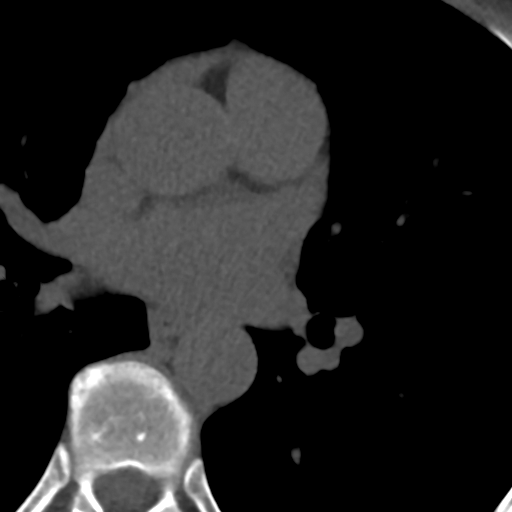
[im 45/51  vessel]
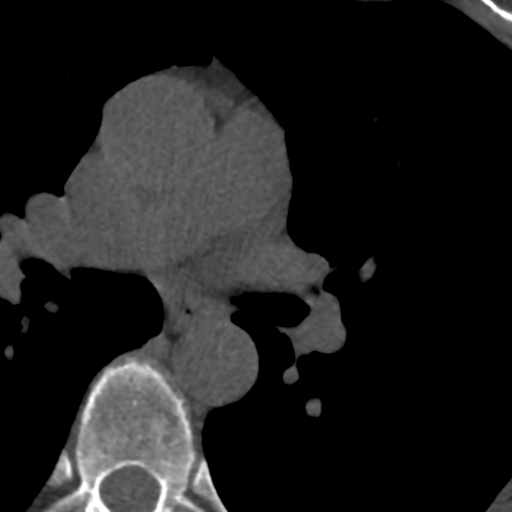

[Series 4: lung st 68 % · axial · 0.62mm/px · z∈[-241,-124]mm · 8 of 51 slices shown]
[im 6/51  lung]
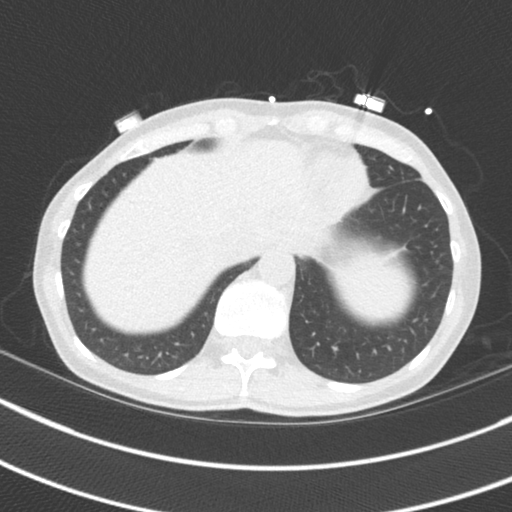
[im 12/51  lung]
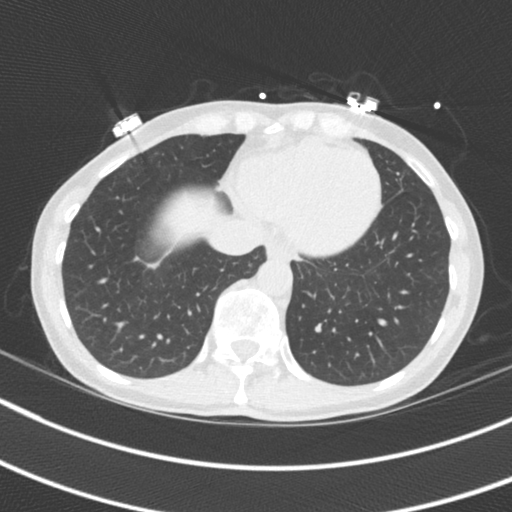
[im 17/51  lung]
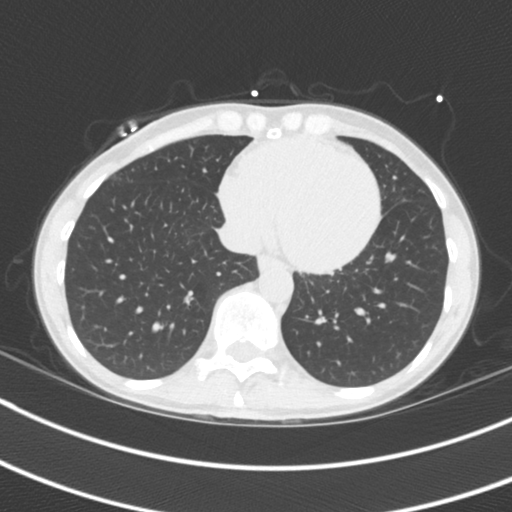
[im 23/51  lung]
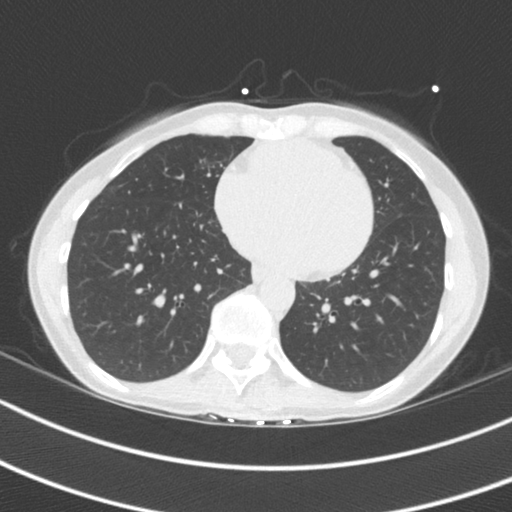
[im 28/51  lung]
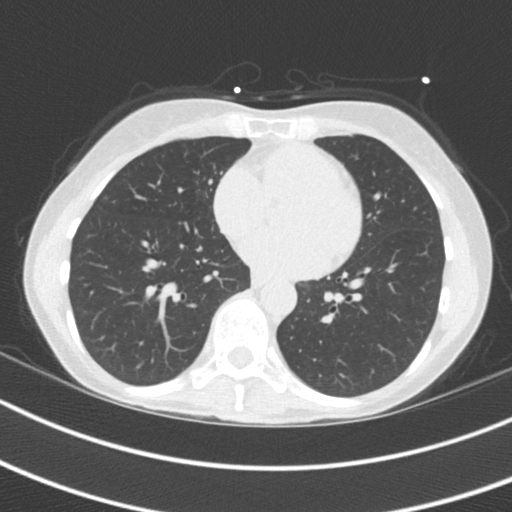
[im 34/51  lung]
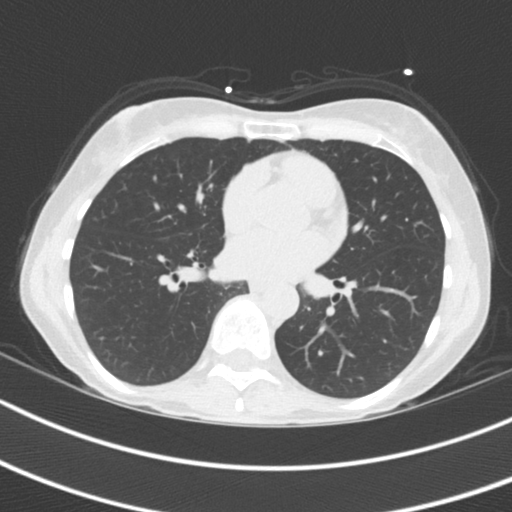
[im 39/51  lung]
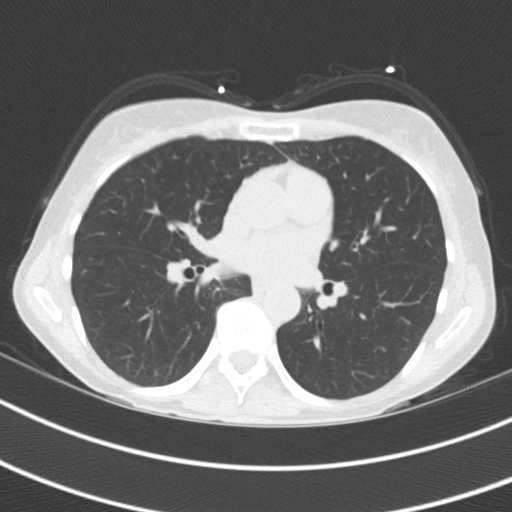
[im 45/51  lung]
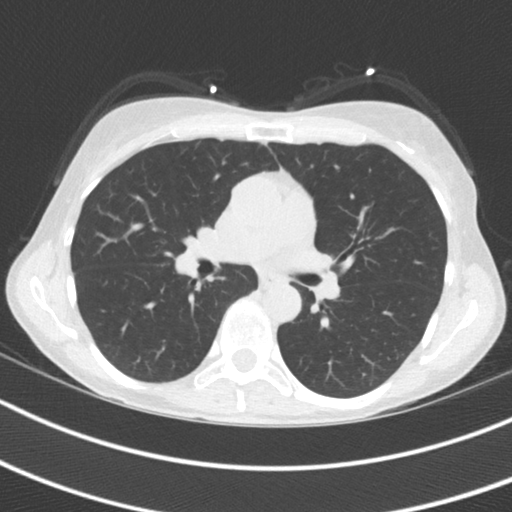

[16 of 20 positions shown; findings below may reference images not displayed]

FINDINGS: Within the visualized portions of the thorax there are no suspicious
appearing pulmonary nodules or masses, there is no acute
consolidative airspace disease, no pleural effusions, no
pneumothorax and no lymphadenopathy. Visualized portions of the
upper abdomen are unremarkable. There are no aggressive appearing
lytic or blastic lesions noted in the visualized portions of the
skeleton.
IMPRESSION: 1. No significant incidental noncardiac findings are noted.
FINDINGS: Non-cardiac: See separate report from [REDACTED].

Ascending aorta: Normal

Pericardium: Normal

Coronary arteries: Normal origin
IMPRESSION: Coronary calcium score of 0. This was 0 percentile for age and sex
matched control.

Shandi Iversen

*** End of Addendum ***
EXAM:
OVER-READ INTERPRETATION  CT CHEST

The following report is an over-read performed by radiologist Dr.
Pirategabe Gansperger [REDACTED] on 09/22/2019. This
over-read does not include interpretation of cardiac or coronary
anatomy or pathology. The coronary calcium score interpretation by
the cardiologist is attached.
FINDINGS: Within the visualized portions of the thorax there are no suspicious
appearing pulmonary nodules or masses, there is no acute
consolidative airspace disease, no pleural effusions, no
pneumothorax and no lymphadenopathy. Visualized portions of the
upper abdomen are unremarkable. There are no aggressive appearing
lytic or blastic lesions noted in the visualized portions of the
skeleton.
IMPRESSION: 1. No significant incidental noncardiac findings are noted.

## 2021-10-03 ENCOUNTER — Telehealth: Payer: Self-pay | Admitting: Physical Medicine and Rehabilitation

## 2021-10-03 DIAGNOSIS — M542 Cervicalgia: Secondary | ICD-10-CM

## 2021-10-03 NOTE — Telephone Encounter (Signed)
Scheduled patient for trigger point injection on Tuesday 10/11, but she states that she would like to have this done sooner. She requested this afternoon mor Monday, but I advised that we do not have any openings. She wants to know if there is anyone else you know of who can get her in for this. I did tell the patient that she would not hear a response from me until Monday. Please advise.

## 2021-10-03 NOTE — Progress Notes (Deleted)
Office Visit Note  Patient: Christine Ford             Date of Birth: June 26, 1961           MRN: [id number]             PCP: Willow Ora, MD Referring: Willow Ora, MD Visit Date: 10/13/2021 Occupation: @GUAROCC @  Subjective:  No chief complaint on file.   History of Present Illness: Christine Ford is a 60 y.o. female ***   Activities of Daily Living:  Patient reports morning stiffness for *** {minute/hour:19697}.   Patient {ACTIONS;DENIES/REPORTS:21021675::"Denies"} nocturnal pain.  Difficulty dressing/grooming: {ACTIONS;DENIES/REPORTS:21021675::"Denies"} Difficulty climbing stairs: {ACTIONS;DENIES/REPORTS:21021675::"Denies"} Difficulty getting out of chair: {ACTIONS;DENIES/REPORTS:21021675::"Denies"} Difficulty using hands for taps, buttons, cutlery, and/or writing: {ACTIONS;DENIES/REPORTS:21021675::"Denies"}  No Rheumatology ROS completed.   PMFS History:  Patient Active Problem List   Diagnosis Date Noted   Myofascial pain syndrome 05/20/2021   Spondylosis without myelopathy or radiculopathy, lumbar region 05/20/2021   Sicca syndrome (HCC) 01/27/2021   Chronic eczematoid otitis externa of both ears 11/01/2020   Other idiopathic scoliosis, lumbar region 09/30/2018   DDD (degenerative disc disease), cervical 05/06/2018   Chronic left shoulder pain 05/06/2018   History of palpitations 07/29/2017   Osteoporosis 07/28/2017   Family history of premature CAD 07/28/2017   Primary insomnia 09/26/2008   GAD (generalized anxiety disorder) 09/28/2007   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    Past Medical History:  Diagnosis Date   Anxiety    Arthritis    Family history of dementia, mother, age > 72 07/28/2017   Osteopenia    Temporomandibular joint disorder (TMJ)     Family History  Problem Relation Age of Onset   Coronary artery disease Mother 75   Hypertension Mother    Osteoporosis Mother    COPD Mother    Alzheimer's disease Mother    Hypertension Father    Heart  attack Father 41   Heart disease Father    Glaucoma Son    Healthy Son    Breast cancer Other    Lung cancer Other    Lung cancer Maternal Aunt    Heart disease Sister    Healthy Son    Lung cancer Cousin    Breast cancer Cousin    Past Surgical History:  Procedure Laterality Date   BREAST SURGERY     dental implant  2022   WISDOM TOOTH EXTRACTION     Social History   Social History Narrative   Occupation: 2023, 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  3 sons, ages 19, 86, and 90.  Used to exercise at the [facility] but now walks at lunch for exercise due to COVID restrictions.  Diet with no absolute restrictions but naturally decreases gluten and red meat.  History of palpitations that was worked up and found to be related to stress.  Parents smoked and so had long secondhand smoke exposure growing up.      Immunization History  Administered Date(s) Administered   Influenza,inj,Quad PF,6+ Mos 11/22/2018, 09/15/2019   Influenza-Unspecified 09/12/2020   PFIZER(Purple Top)SARS-COV-2 Vaccination 03/02/2020, 03/23/2020   PPD Test 10/22/2014, 11/05/2014   Td 12/03/2006   Tdap 12/19/2016   Zoster Recombinat (Shingrix) 03/07/2021, 08/07/2021     Objective: Vital Signs: There were no vitals taken for this visit.   Physical Exam   Musculoskeletal Exam: ***  CDAI Exam: CDAI Score: -- Patient Global: --; Provider Global: -- Swollen: --; Tender: -- Joint Exam 10/13/2021   No  joint exam has been documented for this visit   There is currently no information documented on the homunculus. Go to the Rheumatology activity and complete the homunculus joint exam.  Investigation: No additional findings.  Imaging: No results found.  Recent Labs: Lab Results  Component Value Date   WBC 4.9 03/07/2021   HGB 14.1 03/07/2021   PLT 222.0 03/07/2021   NA 138 03/07/2021   K 4.0 03/07/2021   CL 101 03/07/2021   CO2 29 03/07/2021   GLUCOSE 87 03/07/2021   BUN 18  03/07/2021   CREATININE 0.82 03/07/2021   BILITOT 0.9 03/07/2021   ALKPHOS 51 03/07/2021   AST 25 03/07/2021   ALT 17 03/07/2021   PROT 7.2 03/07/2021   ALBUMIN 4.4 03/07/2021   CALCIUM 9.4 03/07/2021   GFRAA 98 04/04/2018    Speciality Comments: No specialty comments available.  Procedures:  No procedures performed Allergies: Patient has no known allergies.   Assessment / Plan:     Visit Diagnoses: Other osteoporosis without current pathological fracture  Medication monitoring encounter  DDD (degenerative disc disease), cervical  Trapezius muscle spasm  Other idiopathic scoliosis, lumbar region  Lateral epicondylitis, right elbow  Piriformis syndrome of right side  Sicca syndrome (HCC)  Orders: No orders of the defined types were placed in this encounter.  No orders of the defined types were placed in this encounter.   Face-to-face time spent with patient was *** minutes. Greater than 50% of time was spent in counseling and coordination of care.  Follow-Up Instructions: No follow-ups on file.   Gearldine Bienenstock, PA-C  Note - This record has been created using Dragon software.  Chart creation errors have been sought, but may not always  have been located. Such creation errors do not reflect on  the standard of medical care.

## 2021-10-03 NOTE — Telephone Encounter (Signed)
Patient called. She would like an appointment with Dr. Alvester Morin. Her call back number is [phone]

## 2021-10-06 NOTE — Telephone Encounter (Signed)
Patient scheduled for OV on 10/17. She is requesting a referral to PT for dry needling.

## 2021-10-06 NOTE — Telephone Encounter (Signed)
Order placed for PT 

## 2021-10-06 NOTE — Telephone Encounter (Signed)
Pt calling to cancel her appt for tomorrow and wants to see if there were any openings for today. Can come at any time today. Pt did say the symptoms were a bit better and does not know if she still needs to injs or not. The best call back number is [phone].

## 2021-10-07 ENCOUNTER — Ambulatory Visit: Payer: BC Managed Care – PPO | Admitting: Physical Medicine and Rehabilitation

## 2021-10-13 ENCOUNTER — Ambulatory Visit: Payer: BC Managed Care – PPO | Admitting: Physical Medicine and Rehabilitation

## 2021-10-13 ENCOUNTER — Ambulatory Visit: Payer: BC Managed Care – PPO | Admitting: Rheumatology

## 2021-10-13 ENCOUNTER — Other Ambulatory Visit: Payer: Self-pay

## 2021-10-13 ENCOUNTER — Telehealth: Payer: Self-pay

## 2021-10-13 ENCOUNTER — Encounter: Payer: Self-pay | Admitting: Rheumatology

## 2021-10-13 VITALS — BP 148/79 | HR 64 | Ht 64.0 in | Wt 116.0 lb

## 2021-10-13 DIAGNOSIS — M818 Other osteoporosis without current pathological fracture: Secondary | ICD-10-CM

## 2021-10-13 DIAGNOSIS — M542 Cervicalgia: Secondary | ICD-10-CM | POA: Diagnosis not present

## 2021-10-13 DIAGNOSIS — M62838 Other muscle spasm: Secondary | ICD-10-CM | POA: Diagnosis not present

## 2021-10-13 DIAGNOSIS — M7711 Lateral epicondylitis, right elbow: Secondary | ICD-10-CM

## 2021-10-13 DIAGNOSIS — M3500 Sicca syndrome, unspecified: Secondary | ICD-10-CM

## 2021-10-13 DIAGNOSIS — G5701 Lesion of sciatic nerve, right lower limb: Secondary | ICD-10-CM

## 2021-10-13 DIAGNOSIS — M5030 Other cervical disc degeneration, unspecified cervical region: Secondary | ICD-10-CM | POA: Diagnosis not present

## 2021-10-13 DIAGNOSIS — M4126 Other idiopathic scoliosis, lumbar region: Secondary | ICD-10-CM

## 2021-10-13 DIAGNOSIS — Z5181 Encounter for therapeutic drug level monitoring: Secondary | ICD-10-CM

## 2021-10-13 MED ORDER — LIDOCAINE HCL 1 % IJ SOLN
0.5000 mL | INTRAMUSCULAR | Status: AC | PRN
  Administered 2021-10-13: .5 mL

## 2021-10-13 MED ORDER — TRIAMCINOLONE ACETONIDE 40 MG/ML IJ SUSP
10.0000 mg | INTRAMUSCULAR | Status: AC | PRN
  Administered 2021-10-13: 10 mg via INTRAMUSCULAR

## 2021-10-13 MED ORDER — MELOXICAM 15 MG PO TABS: 15.0000 mg | ORAL_TABLET | Freq: Every day | ORAL | 0 refills | Status: DC

## 2021-10-13 NOTE — Telephone Encounter (Signed)
Had to r/s pt , pt request Anit-flam Rx for her pain.

## 2021-10-13 NOTE — Patient Instructions (Signed)
Cervical Strain and Sprain Rehab ?Ask your health care provider which exercises are safe for you. Do exercises exactly as told by your health care provider and adjust them as directed. It is normal to feel mild stretching, pulling, tightness, or discomfort as you do these exercises. Stop right away if you feel sudden pain or your pain gets worse. Do not begin these exercises until told by your health care provider. ?Stretching and range-of-motion exercises ?Cervical side bending ? ?Using good posture, sit on a stable chair or stand up. ?Without moving your shoulders, slowly tilt your left / right ear to your shoulder until you feel a stretch in the opposite side neck muscles. You should be looking straight ahead. ?Hold for __________ seconds. ?Repeat with the other side of your neck. ?Repeat __________ times. Complete this exercise __________ times a day. ?Cervical rotation ? ?Using good posture, sit on a stable chair or stand up. ?Slowly turn your head to the side as if you are looking over your left / right shoulder. ?Keep your eyes level with the ground. ?Stop when you feel a stretch along the side and the back of your neck. ?Hold for __________ seconds. ?Repeat this by turning to your other side. ?Repeat __________ times. Complete this exercise __________ times a day. ?Thoracic extension and pectoral stretch ?Roll a towel or a small blanket so it is about 4 inches (10 cm) in diameter. ?Lie down on your back on a firm surface. ?Put the towel lengthwise, under your spine in the middle of your back. It should not be under your shoulder blades. The towel should line up with your spine from your middle back to your lower back. ?Put your hands behind your head and let your elbows fall out to your sides. ?Hold for __________ seconds. ?Repeat __________ times. Complete this exercise __________ times a day. ?Strengthening exercises ?Isometric upper cervical flexion ?Lie on your back with a thin pillow behind your head  and a small rolled-up towel under your neck. ?Gently tuck your chin toward your chest and nod your head down to look toward your feet. Do not lift your head off the pillow. ?Hold for __________ seconds. ?Release the tension slowly. Relax your neck muscles completely before you repeat this exercise. ?Repeat __________ times. Complete this exercise __________ times a day. ?Isometric cervical extension ? ?Stand about 6 inches (15 cm) away from a wall, with your back facing the wall. ?Place a soft object, about 6-8 inches (15-20 cm) in diameter, between the back of your head and the wall. A soft object could be a small pillow, a ball, or a folded towel. ?Gently tilt your head back and press into the soft object. Keep your jaw and forehead relaxed. ?Hold for __________ seconds. ?Release the tension slowly. Relax your neck muscles completely before you repeat this exercise. ?Repeat __________ times. Complete this exercise __________ times a day. ?Posture and body mechanics ?Body mechanics refers to the movements and positions of your body while you do your daily activities. Posture is part of body mechanics. Good posture and healthy body mechanics can help to relieve stress in your body's tissues and joints. Good posture means that your spine is in its natural S-curve position (your spine is neutral), your shoulders are pulled back slightly, and your head is not tipped forward. The following are general guidelines for applying improved posture and body mechanics to your everyday activities. ?Sitting ? ?When sitting, keep your spine neutral and keep your feet flat on the floor.   Use a footrest, if necessary, and keep your thighs parallel to the floor. Avoid rounding your shoulders, and avoid tilting your head forward. ?When working at a desk or a computer, keep your desk at a height where your hands are slightly lower than your elbows. Slide your chair under your desk so you are close enough to maintain good posture. ?When  working at a computer, place your monitor at a height where you are looking straight ahead and you do not have to tilt your head forward or downward to look at the screen. ?Standing ? ?When standing, keep your spine neutral and keep your feet about hip-width apart. Keep a slight bend in your knees. Your ears, shoulders, and hips should line up. ?When you do a task in which you stand in one place for a long time, place one foot up on a stable object that is 2-4 inches (5-10 cm) high, such as a footstool. This helps keep your spine neutral. ?Resting ?When lying down and resting, avoid positions that are most painful for you. Try to support your neck in a neutral position. You can use a contour pillow or a small rolled-up towel. Your pillow should support your neck but not push on it. ?This information is not intended to replace advice given to you by your health care provider. Make sure you discuss any questions you have with your health care provider. ?Document Revised: 04/05/2019 Document Reviewed: 09/14/2018 ?Elsevier Patient Education ? 2022 Elsevier Inc[employer] ? ?

## 2021-10-13 NOTE — Progress Notes (Signed)
Office Visit Note  Patient: Christine Ford             Date of Birth: Sep 04, 1961           MRN: [id number]             PCP: Willow Ora, MD Referring: Willow Ora, MD Visit Date: 10/13/2021 Occupation: @GUAROCC @  Subjective:  Other (Patient requested left trigger point injection. )   History of Present Illness: Christine Ford is a 60 y.o. female with a history of osteoporosis, and degenerative disc the cervical spine.  She had good response to the trigger point injection in May 2022.  She states the pain has come back and has become severe now she has tried over-the-counter medications and heating pad without much relief.  She would like to have a cortisone trigger point injection in the right trapezius region.  She continues to have some right lateral epicondylitis and right piriformis discomfort but is manageable.   Activities of Daily Living:  Patient reports morning stiffness for 0 minutes.   Patient Reports nocturnal pain.  Difficulty dressing/grooming: Denies Difficulty climbing stairs: Denies Difficulty getting out of chair: Denies Difficulty using hands for taps, buttons, cutlery, and/or writing: Denies  Review of Systems  Constitutional:  Negative for fatigue.  HENT:  Positive for mouth dryness and nose dryness. Negative for mouth sores.   Eyes:  Positive for dryness. Negative for pain and itching.  Respiratory:  Negative for shortness of breath and difficulty breathing.   Cardiovascular:  Negative for chest pain and palpitations.  Gastrointestinal:  Negative for blood in stool, constipation and diarrhea.  Endocrine: Negative for increased urination.  Genitourinary:  Negative for difficulty urinating.  Musculoskeletal:  Positive for myalgias, muscle tenderness and myalgias. Negative for joint pain, joint pain, joint swelling and morning stiffness.  Skin:  Negative for color change, rash and redness.  Allergic/Immunologic: Negative for susceptible to infections.   Neurological:  Negative for dizziness, numbness, headaches, memory loss and weakness.  Hematological:  Negative for bruising/bleeding tendency.  Psychiatric/Behavioral:  Negative for confusion.    PMFS History:  Patient Active Problem List   Diagnosis Date Noted   Myofascial pain syndrome 05/20/2021   Spondylosis without myelopathy or radiculopathy, lumbar region 05/20/2021   Sicca syndrome (HCC) 01/27/2021   Chronic eczematoid otitis externa of both ears 11/01/2020   Other idiopathic scoliosis, lumbar region 09/30/2018   DDD (degenerative disc disease), cervical 05/06/2018   Chronic left shoulder pain 05/06/2018   History of palpitations 07/29/2017   Osteoporosis 07/28/2017   Family history of premature CAD 07/28/2017   Primary insomnia 09/26/2008   GAD (generalized anxiety disorder) 09/28/2007   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    Past Medical History:  Diagnosis Date   Anxiety    Arthritis    Family history of dementia, mother, age > 91 07/28/2017   Osteopenia    Temporomandibular joint disorder (TMJ)     Family History  Problem Relation Age of Onset   Coronary artery disease Mother 70   Hypertension Mother    Osteoporosis Mother    COPD Mother    Alzheimer's disease Mother    Hypertension Father    Heart attack Father 47   Heart disease Father    Glaucoma Son    Healthy Son    Breast cancer Other    Lung cancer Other    Lung cancer Maternal Aunt    Heart disease Sister    Healthy Son    Lung cancer  Cousin    Breast cancer Cousin    Past Surgical History:  Procedure Laterality Date   BREAST SURGERY     dental implant  2022   WISDOM TOOTH EXTRACTION     Social History   Social History Narrative   Occupation: Armed forces operational officer, 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  3 sons, ages 39, 36, and 68.  Used to exercise at the [facility] but now walks at lunch for exercise due to COVID restrictions.  Diet with no absolute restrictions but naturally decreases gluten  and red meat.  History of palpitations that was worked up and found to be related to stress.  Parents smoked and so had long secondhand smoke exposure growing up.      Immunization History  Administered Date(s) Administered   Influenza,inj,Quad PF,6+ Mos 11/22/2018, 09/15/2019   Influenza-Unspecified 09/12/2020   PFIZER(Purple Top)SARS-COV-2 Vaccination 03/02/2020, 03/23/2020   PPD Test 10/22/2014, 11/05/2014   Td 12/03/2006   Tdap 12/19/2016   Zoster Recombinat (Shingrix) 03/07/2021, 08/07/2021     Objective: Vital Signs: BP (!) 148/79 (BP Location: Left Arm, Patient Position: Sitting, Cuff Size: Normal)   Pulse 64   Ht 5\' 4"  (1.626 m)   Wt 116 lb (52.6 kg)   BMI 19.91 kg/m    Physical Exam Vitals and nursing note reviewed.  Constitutional:      Appearance: She is well-developed.  HENT:     Head: Normocephalic and atraumatic.  Eyes:     Conjunctiva/sclera: Conjunctivae normal.  Cardiovascular:     Rate and Rhythm: Normal rate and regular rhythm.     Heart sounds: Normal heart sounds.  Pulmonary:     Effort: Pulmonary effort is normal.     Breath sounds: Normal breath sounds.  Abdominal:     General: Bowel sounds are normal.     Palpations: Abdomen is soft.  Musculoskeletal:     Cervical back: Normal range of motion.  Lymphadenopathy:     Cervical: No cervical adenopathy.  Skin:    General: Skin is warm and dry.     Capillary Refill: Capillary refill takes less than 2 seconds.  Neurological:     Mental Status: She is alert and oriented to person, place, and time.  Psychiatric:        Behavior: Behavior normal.     Musculoskeletal Exam: She had painful range of motion of the cervical spasm.  She had tenderness over left trapezius area.  Thoracic and lumbar spine were in good range of motion.  Shoulder joints, elbow joints, wrist joints, MCPs PIPs and DIPs with good range of motion with no synovitis.  He has some tenderness over right lateral epicondyle region.  Hip  joints, knee joints, ankles, MTPs and PIPs were in good range of motion with no synovitis.  She had tenderness over right piriformis.  CDAI Exam: CDAI Score: -- Patient Global: --; Provider Global: -- Swollen: --; Tender: -- Joint Exam 10/13/2021   No joint exam has been documented for this visit   There is currently no information documented on the homunculus. Go to the Rheumatology activity and complete the homunculus joint exam.  Investigation: No additional findings.  Imaging: No results found.  Recent Labs: Lab Results  Component Value Date   WBC 4.9 03/07/2021   HGB 14.1 03/07/2021   PLT 222.0 03/07/2021   NA 138 03/07/2021   K 4.0 03/07/2021   CL 101 03/07/2021   CO2 29 03/07/2021   GLUCOSE 87 03/07/2021  BUN 18 03/07/2021   CREATININE 0.82 03/07/2021   BILITOT 0.9 03/07/2021   ALKPHOS 51 03/07/2021   AST 25 03/07/2021   ALT 17 03/07/2021   PROT 7.2 03/07/2021   ALBUMIN 4.4 03/07/2021   CALCIUM 9.4 03/07/2021   GFRAA 98 04/04/2018    Speciality Comments: No specialty comments available.  Procedures:  Trigger Point Inj  Date/Time: 10/13/2021 11:21 AM Performed by: Pollyann Savoy, MD Authorized by: Pollyann Savoy, MD   Consent Given by:  Patient Site marked: the procedure site was marked   Timeout: prior to procedure the correct patient, procedure, and site was verified   Indications:  Muscle spasm and pain Total # of Trigger Points:  1 Location: neck   Needle Size:  27 G Approach:  Dorsal Medications #1:  0.5 mL lidocaine 1 %; 10 mg triamcinolone acetonide 40 MG/ML Patient tolerance:  Patient tolerated the procedure well with no immediate complications Allergies: Patient has no known allergies.   Assessment / Plan:     Visit Diagnoses: Other osteoporosis without current pathological fracture - DEXA 03/14/2021: T-score  -2.2. DEXA 11/07/18 Left femoral neck -2.6, -0.9% change from previous BMD. Lumbar spine -2.3.  Patient plans to get labs in  November and will come back in December to discuss starting on alendronate.  Neck pain-she has been experiencing some discomfort in the neck region.  It appears to be related to her work.  DDD (degenerative disc disease), cervical - MRI of the C-spine on 04/19/18, which revealed multilevel degenerative disc disease and facet arthorsis.  She had C7-T1 right-sided foraminal narrowing.   Trapezius muscle spasm-she had discomfort on palpation of the left trapezius region.  Different treatment options and their side effects were discussed.  She wanted to proceed with the trapezius injection.  After informed consent was obtained left trapezius region was injected with cortisone as described above.  She tolerated the procedure well and postprocedure instructions were given.  Other idiopathic scoliosis, lumbar region  Lateral epicondylitis, right elbow-she continues to have some discomfort.  Stretching exercises was emphasized.  Piriformis syndrome of right side-the pain is manageable currently.  Sicca syndrome (HCC) - +ANA 1:160 NS, -Ro,-LA, -RF. Her mother has history of Sjogren's syndrome.  She has been using over-the-counter products and drinking water which helps.  Orders: Orders Placed This Encounter  Procedures   Trigger Point Inj   COMPLETE METABOLIC PANEL WITH GFR   CBC with Differential/Platelet   VITAMIN D 25 Hydroxy (Vit-D Deficiency, Fractures)    No orders of the defined types were placed in this encounter.    Follow-Up Instructions: Return for DDD, OP.   Pollyann Savoy, MD  Note - This record has been created using Animal nutritionist.  Chart creation errors have been sought, but may not always  have been located. Such creation errors do not reflect on  the standard of medical care.

## 2021-10-15 ENCOUNTER — Telehealth: Payer: Self-pay | Admitting: Physical Medicine and Rehabilitation

## 2021-10-15 NOTE — Telephone Encounter (Signed)
Appointment cancelled

## 2021-10-15 NOTE — Telephone Encounter (Signed)
Patient called advised she got in with another provider for the injection. Patient said she will need to cancel her appointment with Dr Alvester Morin. The number to contact patient if needed is [phone]

## 2021-10-16 ENCOUNTER — Other Ambulatory Visit: Payer: Self-pay | Admitting: Family Medicine

## 2021-10-17 ENCOUNTER — Ambulatory Visit: Payer: BC Managed Care – PPO | Admitting: Physical Medicine and Rehabilitation

## 2021-10-24 ENCOUNTER — Ambulatory Visit: Payer: BC Managed Care – PPO | Admitting: Rehabilitative and Restorative Service Providers"

## 2021-11-03 ENCOUNTER — Encounter: Payer: Self-pay | Admitting: Physical Therapy

## 2021-11-03 ENCOUNTER — Ambulatory Visit (INDEPENDENT_AMBULATORY_CARE_PROVIDER_SITE_OTHER): Payer: BC Managed Care – PPO | Admitting: Physical Therapy

## 2021-11-03 ENCOUNTER — Other Ambulatory Visit: Payer: Self-pay

## 2021-11-03 DIAGNOSIS — M6281 Muscle weakness (generalized): Secondary | ICD-10-CM | POA: Diagnosis not present

## 2021-11-03 DIAGNOSIS — R293 Abnormal posture: Secondary | ICD-10-CM

## 2021-11-03 DIAGNOSIS — M542 Cervicalgia: Secondary | ICD-10-CM | POA: Diagnosis not present

## 2021-11-03 NOTE — Patient Instructions (Signed)
Access Code: [email or web address] URL: [email or web address] Date: 11/03/2021 Prepared by: Raynelle Fanning  Exercises Seated Cervical Retraction - 3 x daily - 7 x weekly - 1 sets - 10 reps - 3-5 sec hold Prone Scapular Slide with Shoulder Extension - 1 x daily - 7 x weekly - 1-2 sets - 10 reps Sidelying ITB Stretch off Table (Mirrored) - 2 x daily - 7 x weekly - 1 sets - 3 reps - 60 seconds hold

## 2021-11-03 NOTE — Therapy (Signed)
[facility] Physical Therapy [address] [place], [place], [address] Phone: [phone]   Fax:  [phone]  Physical Therapy Evaluation  Patient Details  Name: Christine Ford MRN: [id number] Date of Birth: 1961-06-12 Referring Provider (PT): Tyrell Antonio MD   Encounter Date: 11/03/2021   PT End of Session - 11/03/21 1101     Visit Number 1    Number of Visits 8    Date for PT Re-Evaluation 12/29/21    Authorization Type BCBS    PT Start Time 1101    PT Stop Time 1149    PT Time Calculation (min) 48 min    Activity Tolerance Patient tolerated treatment well    Behavior During Therapy [facility] for tasks assessed/performed             Past Medical History:  Diagnosis Date   Anxiety    Arthritis    Family history of dementia, mother, age > 4 07/28/2017   Osteopenia    Temporomandibular joint disorder (TMJ)     Past Surgical History:  Procedure Laterality Date   BREAST SURGERY     dental implant  2022   WISDOM TOOTH EXTRACTION      There were no vitals filed for this visit.    Subjective Assessment - 11/03/21 1104     Subjective Patient had injection 6 months ago in the left trapezius which released it after 1-2 days. It starts small and then worsens  and then it spasms again. She had another on 10/13/21 for chronic muscle spasm. She doesn't know what triggers it.    Pertinent History Osteoporosis    Diagnostic tests none recently    Patient Stated Goals to stop the spasms; get a workout regime    Currently in Pain? No/denies                [facility] PT Assessment - 11/03/21 0001       Assessment   Medical Diagnosis cervicalgia    Referring Provider (PT) Tyrell Antonio MD    Onset Date/Surgical Date 10/13/21    Hand Dominance Right    Prior Therapy with Ellama[facility] Sia for past 3 years      Precautions   Precautions Other (comment)    Precaution Comments osteoporosis      Restrictions   Weight Bearing Restrictions No       Balance Screen   Has the patient fallen in the past 6 months No    Has the patient had a decrease in activity level because of a fear of falling?  No    Is the patient reluctant to leave their home because of a fear of falling?  No      Prior Function   Level of Independence Independent    Vocation Full time employment    Vocation Requirements Dental Hygenist      Posture/Postural Control   Posture/Postural Control Postural limitations    Postural Limitations Rounded Shoulders;Forward head    Posture Comments right convex scoliosis lumbar; even scapular angles and iliac crests; depressed left shoulder      ROM / Strength   AROM / PROM / Strength AROM;Strength      AROM   AROM Assessment Site Cervical    Cervical Flexion full    Cervical Extension full    Cervical - Right Side Bend 10    Cervical - Left Side Bend 19    Cervical - Right Rotation 45    Cervical - Left Rotation 41  Strength   Overall Strength Comments cervical grossly 4+/5; mid traps: Rt 4+/5, Lt 4/5 Low traps 4-/5 bil      Palpation   Spinal mobility WNL    Palpation comment increased tone in bil UT in standing; in supine palpable TP in left UT.                        Objective measurements completed on examination: See above findings.       [facility] Adult PT Treatment/Exercise - 11/03/21 0001       Self-Care   Self-Care Other Self-Care Comments    Other Self-Care Comments  see pt ed; also discussed POC and need to not overstretch UT, maintain good postural alignment; limit activation of traps in work outs      Manual Therapy   Manual therapy comments Skilled palpation and monitoring of soft tissues during DN              Trigger Point Dry Needling - 11/03/21 0001     Consent Given? Yes    Education Handout Provided Yes    Muscles Treated Head and Neck Upper trapezius    Dry Needling Comments left    Upper Trapezius Response Twitch reponse elicited;Palpable increased muscle  length                   PT Education - 11/03/21 1846     Education Details HEP; DN education and aftercare    Person(s) Educated Patient    Methods Explanation;Demonstration;Handout    Comprehension Verbalized understanding;Returned demonstration              PT Short Term Goals - 11/03/21 1850       PT SHORT TERM GOAL #1   Title STG = LTGs               PT Long Term Goals - 11/03/21 1850       PT LONG TERM GOAL #1   Title Pt will be independent with strengthening exercises for home and in the gym and be able to prevent overactivation of UT with these exercises.    Time 8    Period Weeks    Status New    Target Date 12/29/21      PT LONG TERM GOAL #2   Title Improved mid/low trap strength to 4+/5 or better to decrease strain on UT and improve posture.    Time 8    Period Weeks    Status New      PT LONG TERM GOAL #3   Title Pt will report >/= 75% reduction of spasm in her left UT    Baseline -    Time 8    Period Weeks    Status New      PT LONG TERM GOAL #4   Title Pt will have improved cervical Rotation to >= 50 deg to improve function with ADLs    Baseline -    Time 8    Period Weeks    Status New      PT LONG TERM GOAL #5   Title improved bil lateral cervical flexion to 25 deg or better without pain    Time 8    Period Weeks    Status New                    Plan - 11/03/21 1238     Clinical Impression Statement Pt presents with  left trapezius spasm which she has had problems with for 6 months. She had an injection 3 weeks ago with good results but is feeling the return of spasm. She states she is incapacitated when it occurs. She works as a Tax inspector. She has postural deficits including marked forward head, rounded shoulders, a right lumbar convex scoliosis and depressed right shoulder. She presents with somewhat of a left lateral shift as well. Stretch was issued for left lumbar. She has limited cervical rotation and  SB. She does a body pump class regularly which is likely contributing to ongoing upper trap overactivation. She does not want to stop her class. She has weakness in her mid and low traps. She would like to learn how to exercise without activating her traps when they aren't needed. Additionally she spends a lot of time stretching her UT and would likely benefit more from strengthening of UT in addition to mid and low traps.She will benefit from PT to address these deficits. Initial trial of DN to left UT with postive response.    Personal Factors and Comorbidities Behavior Pattern;Comorbidity 1    Comorbidities osteoporosis    Stability/Clinical Decision Making Stable/Uncomplicated    Clinical Decision Making Low    Rehab Potential Good    PT Frequency 1x / week    PT Duration 8 weeks    PT Treatment/Interventions ADLs/Self Care Home Management;Cryotherapy;Electrical Stimulation;Moist Heat;Therapeutic exercise;Patient/family education;Dry needling;Neuromuscular re-education;Manual techniques    PT Next Visit Plan Assess DN and continue as indicated; UT/mid/low trap strength; postural work    PT Home Exercise Plan [email or web address]    Consulted and Agree with Plan of Care Patient             Patient will benefit from skilled therapeutic intervention in order to improve the following deficits and impairments:  Decreased range of motion, Increased muscle spasms, Pain, Impaired flexibility, Decreased strength, Postural dysfunction  Visit Diagnosis: Abnormal posture - Plan: PT plan of care cert/re-cert  Cervicalgia - Plan: PT plan of care cert/re-cert  Muscle weakness (generalized) - Plan: PT plan of care cert/re-cert     Problem List Patient Active Problem List   Diagnosis Date Noted   Myofascial pain syndrome 05/20/2021   Spondylosis without myelopathy or radiculopathy, lumbar region 05/20/2021   Sicca syndrome (HCC) 01/27/2021   Chronic eczematoid otitis externa of both ears 11/01/2020    Other idiopathic scoliosis, lumbar region 09/30/2018   DDD (degenerative disc disease), cervical 05/06/2018   Chronic left shoulder pain 05/06/2018   History of palpitations 07/29/2017   Osteoporosis 07/28/2017   Family history of premature CAD 07/28/2017   Primary insomnia 09/26/2008   GAD (generalized anxiety disorder) 09/28/2007   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

## 2021-11-14 ENCOUNTER — Encounter: Payer: Self-pay | Admitting: Physical Therapy

## 2021-11-14 ENCOUNTER — Other Ambulatory Visit: Payer: Self-pay

## 2021-11-14 ENCOUNTER — Other Ambulatory Visit: Payer: Self-pay | Admitting: *Deleted

## 2021-11-14 ENCOUNTER — Ambulatory Visit (INDEPENDENT_AMBULATORY_CARE_PROVIDER_SITE_OTHER): Payer: BC Managed Care – PPO | Admitting: Physical Therapy

## 2021-11-14 DIAGNOSIS — M542 Cervicalgia: Secondary | ICD-10-CM

## 2021-11-14 DIAGNOSIS — M6281 Muscle weakness (generalized): Secondary | ICD-10-CM

## 2021-11-14 DIAGNOSIS — R293 Abnormal posture: Secondary | ICD-10-CM | POA: Diagnosis not present

## 2021-11-14 DIAGNOSIS — M818 Other osteoporosis without current pathological fracture: Secondary | ICD-10-CM | POA: Diagnosis not present

## 2021-11-14 NOTE — Therapy (Signed)
[facility] Physical Therapy [address] [place], [place], [address] Phone: [phone]   Fax:  [phone]  Physical Therapy Treatment  Patient Details  Name: Christine Ford MRN: [id number] Date of Birth: May 03, 1961 Referring Provider (PT): Tyrell Antonio MD   Encounter Date: 11/14/2021   PT End of Session - 11/14/21 0927     Visit Number 2    Number of Visits 8    Date for PT Re-Evaluation 12/29/21    Authorization Type BCBS    PT Start Time 0855    PT Stop Time 0927    PT Time Calculation (min) 32 min    Activity Tolerance Patient tolerated treatment well    Behavior During Therapy [facility] for tasks assessed/performed             Past Medical History:  Diagnosis Date   Anxiety    Arthritis    Family history of dementia, mother, age > 6 07/28/2017   Osteopenia    Temporomandibular joint disorder (TMJ)     Past Surgical History:  Procedure Laterality Date   BREAST SURGERY     dental implant  2022   WISDOM TOOTH EXTRACTION      There were no vitals filed for this visit.   Subjective Assessment - 11/14/21 0855     Subjective feels like the DN was helpful.    Pertinent History Osteoporosis    Diagnostic tests none recently    Patient Stated Goals to stop the spasms; get a workout regime    Currently in Pain? No/denies                               [facility] Adult PT Treatment/Exercise - 11/14/21 0858       Exercises   Exercises Neck      Neck Exercises: Theraband   Rows 10 reps;Green    Shoulder External Rotation 10 reps;Green      Neck Exercises: Seated   Neck Retraction 10 reps;5 secs    Cervical Rotation Right;Left    Cervical Rotation Limitations 3x10 sec hold; towel stretch    Other Seated Exercise scapular retraction 10 x 5 sec hold      Neck Exercises: Prone   Shoulder Extension 10 reps    Other Prone Exercise alt hip extension x 5 sec hold x 10 reps    Other Prone Exercise quadruped alt hip ext x10  reps bil                     PT Education - 11/14/21 0928     Education Details HEP    Person(s) Educated Patient    Methods Explanation;Demonstration;Handout    Comprehension Verbalized understanding;Returned demonstration;Need further instruction              PT Short Term Goals - 11/03/21 1850       PT SHORT TERM GOAL #1   Title STG = LTGs               PT Long Term Goals - 11/03/21 1850       PT LONG TERM GOAL #1   Title Pt will be independent with strengthening exercises for home and in the gym and be able to prevent overactivation of UT with these exercises.    Time 8    Period Weeks    Status New    Target Date 12/29/21      PT LONG TERM  GOAL #2   Title Improved mid/low trap strength to 4+/5 or better to decrease strain on UT and improve posture.    Time 8    Period Weeks    Status New      PT LONG TERM GOAL #3   Title Pt will report >/= 75% reduction of spasm in her left UT    Baseline -    Time 8    Period Weeks    Status New      PT LONG TERM GOAL #4   Title Pt will have improved cervical Rotation to >= 50 deg to improve function with ADLs    Baseline -    Time 8    Period Weeks    Status New      PT LONG TERM GOAL #5   Title improved bil lateral cervical flexion to 25 deg or better without pain    Time 8    Period Weeks    Status New                   Plan - 11/14/21 1194     Clinical Impression Statement Pt with positive response to DN last visit and no pain today so deferred.  Added additional postural exercises to HEP today and continued work on Animator.  Will continue to benefit from PT to maximize function.    Personal Factors and Comorbidities Behavior Pattern;Comorbidity 1    Comorbidities osteoporosis    Stability/Clinical Decision Making Stable/Uncomplicated    Rehab Potential Good    PT Frequency 1x / week    PT Duration 8 weeks    PT Treatment/Interventions ADLs/Self Care Home  Management;Cryotherapy;Electrical Stimulation;Moist Heat;Therapeutic exercise;Patient/family education;Dry needling;Neuromuscular re-education;Manual techniques    PT Next Visit Plan DN as indicated; UT/mid/low trap strength; postural work    PT Home Exercise Plan [email or web address]    Consulted and Agree with Plan of Care Patient             Patient will benefit from skilled therapeutic intervention in order to improve the following deficits and impairments:  Decreased range of motion, Increased muscle spasms, Pain, Impaired flexibility, Decreased strength, Postural dysfunction  Visit Diagnosis: Abnormal posture  Cervicalgia  Muscle weakness (generalized)     Problem List Patient Active Problem List   Diagnosis Date Noted   Myofascial pain syndrome 05/20/2021   Spondylosis without myelopathy or radiculopathy, lumbar region 05/20/2021   Sicca syndrome (HCC) 01/27/2021   Chronic eczematoid otitis externa of both ears 11/01/2020   Other idiopathic scoliosis, lumbar region 09/30/2018   DDD (degenerative disc disease), cervical 05/06/2018   Chronic left shoulder pain 05/06/2018   History of palpitations 07/29/2017   Osteoporosis 07/28/2017   Family history of premature CAD 07/28/2017   Primary insomnia 09/26/2008   GAD (generalized anxiety disorder) 09/28/2007   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 [place], [place], [address] Phone: [phone]   Fax:  [phone]  Name: Angele Wiemann MRN: [id number] Date of Birth: 26-Dec-1961

## 2021-11-14 NOTE — Patient Instructions (Signed)
Access Code: [email or web address] URL: [email or web address] Date: 11/14/2021 Prepared by: Moshe Cipro  Exercises Seated Cervical Retraction - 3 x daily - 7 x weekly - 1 sets - 10 reps - 3-5 sec hold Prone Scapular Slide with Shoulder Extension - 1 x daily - 7 x weekly - 1-2 sets - 10 reps Sidelying ITB Stretch off Table (Mirrored) - 2 x daily - 7 x weekly - 1 sets - 3 reps - 60 seconds hold Standing Row with Anchored Resistance - 1 x daily - 7 x weekly - 1-2 sets - 10 reps Shoulder External Rotation and Scapular Retraction with Resistance - 1 x daily - 7 x weekly - 1-2 sets - 10 reps - 5 sec hold Shoulder extension with resistance - Neutral - 1 x daily - 7 x weekly - 1-2 sets - 10 reps Standing Shoulder Horizontal Abduction with Resistance - 1 x daily - 7 x weekly - 1-2 sets - 10 reps  Patient Education Trigger Point Dry Needling

## 2021-11-15 LAB — CBC WITH DIFFERENTIAL/PLATELET
Absolute Monocytes: 462 cells/uL (ref 200–950)
Basophils Absolute: 50 cells/uL (ref 0–200)
Basophils Relative: 0.9 %
Eosinophils Absolute: 94 cells/uL (ref 15–500)
Eosinophils Relative: 1.7 %
HCT: 38.8 % (ref 35.0–45.0)
Hemoglobin: 13.3 g/dL (ref 11.7–15.5)
Lymphs Abs: 1799 cells/uL (ref 850–3900)
MCH: 32.8 pg (ref 27.0–33.0)
MCHC: 34.3 g/dL (ref 32.0–36.0)
MCV: 95.8 fL (ref 80.0–100.0)
MPV: 10.6 fL (ref 7.5–12.5)
Monocytes Relative: 8.4 %
Neutro Abs: 3097 cells/uL (ref 1500–7800)
Neutrophils Relative %: 56.3 %
Platelets: 234 10*3/uL (ref 140–400)
RBC: 4.05 10*6/uL (ref 3.80–5.10)
RDW: 13 % (ref 11.0–15.0)
Total Lymphocyte: 32.7 %
WBC: 5.5 10*3/uL (ref 3.8–10.8)

## 2021-11-15 LAB — COMPLETE METABOLIC PANEL WITH GFR
AG Ratio: 1.8 (calc) (ref 1.0–2.5)
ALT: 17 U/L (ref 6–29)
AST: 25 U/L (ref 10–35)
Albumin: 4.4 g/dL (ref 3.6–5.1)
Alkaline phosphatase (APISO): 51 U/L (ref 37–153)
BUN: 17 mg/dL (ref 7–25)
CO2: 29 mmol/L (ref 20–32)
Calcium: 9.6 mg/dL (ref 8.6–10.4)
Chloride: 103 mmol/L (ref 98–110)
Creat: 0.81 mg/dL (ref 0.50–1.05)
Globulin: 2.4 g/dL (calc) (ref 1.9–3.7)
Glucose, Bld: 76 mg/dL (ref 65–99)
Potassium: 4.1 mmol/L (ref 3.5–5.3)
Sodium: 140 mmol/L (ref 135–146)
Total Bilirubin: 0.5 mg/dL (ref 0.2–1.2)
Total Protein: 6.8 g/dL (ref 6.1–8.1)
eGFR: 83 mL/min/{1.73_m2} (ref >=60)

## 2021-11-15 LAB — VITAMIN D 25 HYDROXY (VIT D DEFICIENCY, FRACTURES): Vit D, 25-Hydroxy: 66 ng/mL (ref 30–100)

## 2021-11-24 NOTE — Progress Notes (Signed)
Office Visit Note  Patient: Christine Ford             Date of Birth: 1961/09/25           MRN: [id number]             PCP: Willow Ora, MD Referring: Willow Ora, MD Visit Date: 12/05/2021 Occupation: @GUAROCC @  Subjective:  Medication management.   History of Present Illness: Christine Ford is a 60 y.o. female with a history of osteoporosis and osteoarthritis.  She states that she completed her dental work and is ready to start on Fosamax.  She states she continues to have a stiffness and discomfort in her neck and her entire spine.  She has been going to physical therapy which has been helpful.  She has been also doing some stretching exercises.  Activities of Daily Living:  Patient reports morning stiffness for 5-10 minutes.   Patient Reports nocturnal pain.  Difficulty dressing/grooming: Denies Difficulty climbing stairs: Denies Difficulty getting out of chair: Denies Difficulty using hands for taps, buttons, cutlery, and/or writing: Denies  Review of Systems  Constitutional:  Negative for fatigue.  HENT:  Positive for mouth dryness. Negative for mouth sores and nose dryness.   Eyes:  Positive for dryness. Negative for pain and itching.  Respiratory:  Negative for shortness of breath and difficulty breathing.   Cardiovascular:  Negative for chest pain and palpitations.  Gastrointestinal:  Negative for blood in stool, constipation and diarrhea.  Endocrine: Negative for increased urination.  Genitourinary:  Negative for difficulty urinating.  Musculoskeletal:  Positive for morning stiffness. Negative for joint pain, joint pain, joint swelling, myalgias, muscle tenderness and myalgias.  Skin:  Negative for color change, rash and redness.  Allergic/Immunologic: Negative for susceptible to infections.  Neurological:  Negative for dizziness, numbness, headaches, memory loss and weakness.  Hematological:  Negative for bruising/bleeding tendency.  Psychiatric/Behavioral:   Negative for confusion.    PMFS History:  Patient Active Problem List   Diagnosis Date Noted   Myofascial pain syndrome 05/20/2021   Spondylosis without myelopathy or radiculopathy, lumbar region 05/20/2021   Sicca syndrome (HCC) 01/27/2021   Chronic eczematoid otitis externa of both ears 11/01/2020   Other idiopathic scoliosis, lumbar region 09/30/2018   DDD (degenerative disc disease), cervical 05/06/2018   Chronic left shoulder pain 05/06/2018   History of palpitations 07/29/2017   Osteoporosis 07/28/2017   Family history of premature CAD 07/28/2017   Primary insomnia 09/26/2008   GAD (generalized anxiety disorder) 09/28/2007   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    Past Medical History:  Diagnosis Date   Anxiety    Arthritis    Family history of dementia, mother, age > 44 07/28/2017   Osteopenia    Temporomandibular joint disorder (TMJ)     Family History  Problem Relation Age of Onset   Coronary artery disease Mother 85   Hypertension Mother    Osteoporosis Mother    COPD Mother    Alzheimer's disease Mother    Hypertension Father    Heart attack Father 57   Heart disease Father    Glaucoma Son    Healthy Son    Breast cancer Other    Lung cancer Other    Lung cancer Maternal Aunt    Heart disease Sister    Healthy Son    Lung cancer Cousin    Breast cancer Cousin    Past Surgical History:  Procedure Laterality Date   BREAST SURGERY     dental  implant  2022   WISDOM TOOTH EXTRACTION     Social History   Social History Narrative   Occupation: Copywriter, advertising, 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  3 sons, ages 29, 23, and 71.  Used to exercise at the [facility] but now walks at lunch for exercise due to COVID restrictions.  Diet with no absolute restrictions but naturally decreases gluten and red meat.  History of palpitations that was worked up and found to be related to stress.  Parents smoked and so had long secondhand smoke exposure growing up.       Immunization History  Administered Date(s) Administered   Influenza,inj,Quad PF,6+ Mos 11/22/2018, 09/15/2019   Influenza-Unspecified 09/12/2020   PFIZER(Purple Top)SARS-COV-2 Vaccination 03/02/2020, 03/23/2020   PPD Test 10/22/2014, 11/05/2014   Td 12/03/2006   Tdap 12/19/2016   Zoster Recombinat (Shingrix) 03/07/2021, 08/07/2021     Objective: Vital Signs: BP 131/86 (BP Location: Left Arm, Patient Position: Sitting, Cuff Size: Normal)   Pulse (!) 59   Ht 5\' 4"  (1.626 m)   Wt 120 lb 3.2 oz (54.5 kg)   BMI 20.63 kg/m    Physical Exam Vitals and nursing note reviewed.  Constitutional:      Appearance: She is well-developed.  HENT:     Head: Normocephalic and atraumatic.  Eyes:     Conjunctiva/sclera: Conjunctivae normal.  Cardiovascular:     Rate and Rhythm: Normal rate and regular rhythm.     Heart sounds: Normal heart sounds.  Pulmonary:     Effort: Pulmonary effort is normal.     Breath sounds: Normal breath sounds.  Abdominal:     General: Bowel sounds are normal.     Palpations: Abdomen is soft.  Musculoskeletal:     Cervical back: Normal range of motion.  Lymphadenopathy:     Cervical: No cervical adenopathy.  Skin:    General: Skin is warm and dry.     Capillary Refill: Capillary refill takes less than 2 seconds.  Neurological:     Mental Status: She is alert and oriented to person, place, and time.  Psychiatric:        Behavior: Behavior normal.     Musculoskeletal Exam: C-spine was in good range of motion.  She had bilateral trapezius spasm.  She had no tenderness over thoracic or lumbar spine.  Shoulder joints, elbow joints, wrist joints, MCPs PIPs and DIPs with good range of motion with no synovitis.  Hip joints, knee joints, ankles, MTPs and PIPs with good range of motion with no synovitis.  She continues to have tenderness over right lateral epicondyle and right piriformis.  CDAI Exam: CDAI Score: -- Patient Global: --; Provider Global:  -- Swollen: --; Tender: -- Joint Exam 12/05/2021   No joint exam has been documented for this visit   There is currently no information documented on the homunculus. Go to the Rheumatology activity and complete the homunculus joint exam.  Investigation: No additional findings.  Imaging: No results found.  Recent Labs: Lab Results  Component Value Date   WBC 5.5 11/14/2021   HGB 13.3 11/14/2021   PLT 234 11/14/2021   NA 140 11/14/2021   K 4.1 11/14/2021   CL 103 11/14/2021   CO2 29 11/14/2021   GLUCOSE 76 11/14/2021   BUN 17 11/14/2021   CREATININE 0.81 11/14/2021   BILITOT 0.5 11/14/2021   ALKPHOS 51 03/07/2021   AST 25 11/14/2021   ALT 17 11/14/2021   PROT 6.8 11/14/2021  ALBUMIN 4.4 03/07/2021   CALCIUM 9.6 11/14/2021   GFRAA 98 04/04/2018    Speciality Comments: No specialty comments available.  Procedures:  No procedures performed Allergies: Patient has no known allergies.   Assessment / Plan:     Visit Diagnoses: Other osteoporosis without current pathological fracture - DEXA 03/14/2021: T-score   left femoral neck -2.6, -0.6% change from previous BMD.  DEXA scan findings were discussed with the patient.  We had detailed discussion regarding different treatment options and their side effects.  After indication side effects contraindications were reviewed she was willing to start on alendronate.  She was advised to take alendronate 70 mg p.o. weekly.  Instructions placed in the AVS. I would also check PTH, phosphorus and SPEP.   DDD (degenerative disc disease), cervical - MRI of the C-spine on 04/19/18, which revealed multilevel degenerative disc disease and facet arthorsis.  She had C7-T1 right-sided foraminal narrowing.  A handout on neck exercises was placed in the AVS.  Trapezius muscle spasm-she has bilateral trapezius spasm which continues to bother her.  It is related to her work as she works as a Copywriter, advertising.  She has a TENS unit which she can use.  I  also discussed possible use of lidocaine patches.  She had good response to trapezius injection in the past.  Other idiopathic scoliosis, lumbar region-she has chronic discomfort.  A handout on back exercises was placed in the AVS.  Lateral epicondylitis, right elbow-related to her work.  Piriformis syndrome of right side  Sicca syndrome (HCC) - +ANA 1:160 NS, -Ro,-LA, -RF. Her mother has history of Sjogren's syndrome.  She uses over-the-counter products which helped.  Orders: Orders Placed This Encounter  Procedures   Parathyroid hormone, intact (no Ca)   Phosphorus   Serum protein electrophoresis with reflex    Meds ordered this encounter  Medications   alendronate (FOSAMAX) 70 MG tablet    Sig: Take 1 tablet (70 mg total) by mouth once a week. Take with a full glass of water on an empty stomach.    Dispense:  4 tablet    Refill:  5     Follow-Up Instructions: Return in about 3 months (around 03/05/2022) for Osteoporosis, Osteoarthritis.   Bo Merino, MD  Note - This record has been created using Editor, commissioning.  Chart creation errors have been sought, but may not always  have been located. Such creation errors do not reflect on  the standard of medical care.

## 2021-12-05 ENCOUNTER — Ambulatory Visit (INDEPENDENT_AMBULATORY_CARE_PROVIDER_SITE_OTHER): Payer: BC Managed Care – PPO | Admitting: Rheumatology

## 2021-12-05 ENCOUNTER — Other Ambulatory Visit: Payer: Self-pay

## 2021-12-05 ENCOUNTER — Encounter: Payer: Self-pay | Admitting: Rheumatology

## 2021-12-05 VITALS — BP 131/86 | HR 59 | Ht 64.0 in | Wt 120.2 lb

## 2021-12-05 DIAGNOSIS — M818 Other osteoporosis without current pathological fracture: Secondary | ICD-10-CM | POA: Diagnosis not present

## 2021-12-05 DIAGNOSIS — M5030 Other cervical disc degeneration, unspecified cervical region: Secondary | ICD-10-CM | POA: Diagnosis not present

## 2021-12-05 DIAGNOSIS — M4126 Other idiopathic scoliosis, lumbar region: Secondary | ICD-10-CM

## 2021-12-05 DIAGNOSIS — M62838 Other muscle spasm: Secondary | ICD-10-CM

## 2021-12-05 DIAGNOSIS — M7711 Lateral epicondylitis, right elbow: Secondary | ICD-10-CM

## 2021-12-05 DIAGNOSIS — M3500 Sicca syndrome, unspecified: Secondary | ICD-10-CM

## 2021-12-05 DIAGNOSIS — G5701 Lesion of sciatic nerve, right lower limb: Secondary | ICD-10-CM

## 2021-12-05 MED ORDER — ALENDRONATE SODIUM 70 MG PO TABS: 70.0000 mg | ORAL_TABLET | ORAL | 5 refills | Status: DC

## 2021-12-05 NOTE — Patient Instructions (Addendum)
Alendronate Tablets What is this medication? ALENDRONATE (a LEN droe nate) prevents and treats osteoporosis. It may also be used to treat Paget disease of the bone. It works by making your bones stronger and less likely to break (fracture). It belongs to a group of medications called bisphosphonates. This medicine may be used for other purposes; ask your health care provider or pharmacist if you have questions. COMMON BRAND NAME(S): Fosamax What should I tell my care team before I take this medication? They need to know if you have any of these conditions: Bleeding disorder Cancer Dental disease Difficulty swallowing Infection (fever, chills, cough, sore throat, pain or trouble passing urine) Kidney disease Low levels of calcium or other minerals in the blood Low red blood cell counts Receiving steroids like dexamethasone or prednisone Stomach or intestine problems Trouble sitting or standing for 30 minutes An unusual or allergic reaction to alendronate, other medications, foods, dyes or preservatives Pregnant or trying to get pregnant Breast-feeding How should I use this medication? Take this medication by mouth with a full glass of water. Take it as directed on the prescription label at the same time every day. Take the dose right after waking up. Do not eat or drink anything before taking it. Do not take it with any other drink except water. Do not chew or crush the tablet. After taking it, do not eat breakfast, drink, or take any other medications or vitamins for at least 30 minutes. Sit or stand up for at least 30 minutes after you take it. Do not lie down. Keep taking it unless your care team tells you to stop. A special MedGuide will be given to you by the pharmacist with each prescription and refill. Be sure to read this information carefully each time. Talk to your care team about the use of this medication in children. Special care may be needed. Overdosage: If you think you have  taken too much of this medicine contact a poison control center or emergency room at once. NOTE: This medicine is only for you. Do not share this medicine with others. What if I miss a dose? If you take your medication once a day, skip it. Take your next dose at the scheduled time the next morning. Do not take two doses on the same day. If you take your medication once a week, take the missed dose on the morning after you remember. Do not take two doses on the same day. What may interact with this medication? Aluminum hydroxide Antacids Aspirin Calcium supplements Medications for inflammation like ibuprofen, naproxen, and others Iron supplements Magnesium supplements Vitamins with minerals This list may not describe all possible interactions. Give your health care provider a list of all the medicines, herbs, non-prescription drugs, or dietary supplements you use. Also tell them if you smoke, drink alcohol, or use illegal drugs. Some items may interact with your medicine. What should I watch for while using this medication? Visit your care team for regular checks on your progress. It may be some time before you see the benefit from this medication. Some people who take this medication have severe bone, joint, or muscle pain. This medication may also increase your risk for jaw problems or a broken thigh bone. Tell your care team right away if you have severe pain in your jaw, bones, joints, or muscles. Tell you care team if you have any pain that does not go away or that gets worse. Tell your dentist and dental surgeon that you are   taking this medication. You should not have major dental surgery while on this medication. See your dentist to have a dental exam and fix any dental problems before starting this medication. Take good care of your teeth while on this medication. Make sure you see your dentist for regular follow-up appointments. You should make sure you get enough calcium and vitamin D  while you are taking this medication. Discuss the foods you eat and the vitamins you take with your care team. You may need blood work done while you are taking this medication. What side effects may I notice from receiving this medication? Side effects that you should report to your care team as soon as possible: Allergic reactions--skin rash, itching, hives, swelling of the face, lips, tongue, or throat Low calcium level--muscle pain or cramps, confusion, tingling, or numbness in the hands or feet Osteonecrosis of the jaw--pain, swelling, or redness in the mouth, numbness of the jaw, poor healing after dental work, unusual discharge from the mouth, visible bones in the mouth Pain or trouble swallowing Severe bone, joint, or muscle pain Stomach bleeding--bloody or black, tar-like stools, vomiting blood or brown material that looks like coffee grounds Side effects that usually do not require medical attention (report to your care team if they continue or are bothersome): Constipation Diarrhea Nausea Stomach pain This list may not describe all possible side effects. Call your doctor for medical advice about side effects. You may report side effects to FDA at 1-800-FDA-1088. Where should I keep my medication? Keep out of the reach of children and pets. Store at room temperature between 15 and 30 degrees C (59 and 86 degrees F). Throw away any unused medication after the expiration date. NOTE: This sheet is a summary. It may not cover all possible information. If you have questions about this medicine, talk to your doctor, pharmacist, or health care provider.  2022 Elsevier/Gold Standard (2020-12-26 00:00:00)    Cervical Strain and Sprain Rehab Ask your health care provider which exercises are safe for you. Do exercises exactly as told by your health care provider and adjust them as directed. It is normal to feel mild stretching, pulling, tightness, or discomfort as you do these exercises.  Stop right away if you feel sudden pain or your pain gets worse. Do not begin these exercises until told by your health care provider. Stretching and range-of-motion exercises Cervical side bending  Using good posture, sit on a stable chair or stand up. Without moving your shoulders, slowly tilt your left / right ear to your shoulder until you feel a stretch in the opposite side neck muscles. You should be looking straight ahead. Hold for __________ seconds. Repeat with the other side of your neck. Repeat __________ times. Complete this exercise __________ times a day. Cervical rotation  Using good posture, sit on a stable chair or stand up. Slowly turn your head to the side as if you are looking over your left / right shoulder. Keep your eyes level with the ground. Stop when you feel a stretch along the side and the back of your neck. Hold for __________ seconds. Repeat this by turning to your other side. Repeat __________ times. Complete this exercise __________ times a day. Thoracic extension and pectoral stretch Roll a towel or a small blanket so it is about 4 inches (10 cm) in diameter. Lie down on your back on a firm surface. Put the towel lengthwise, under your spine in the middle of your back. It should not be  under your shoulder blades. The towel should line up with your spine from your middle back to your lower back. Put your hands behind your head and let your elbows fall out to your sides. Hold for __________ seconds. Repeat __________ times. Complete this exercise __________ times a day. Strengthening exercises Isometric upper cervical flexion Lie on your back with a thin pillow behind your head and a small rolled-up towel under your neck. Gently tuck your chin toward your chest and nod your head down to look toward your feet. Do not lift your head off the pillow. Hold for __________ seconds. Release the tension slowly. Relax your neck muscles completely before you repeat  this exercise. Repeat __________ times. Complete this exercise __________ times a day. Isometric cervical extension  Stand about 6 inches (15 cm) away from a wall, with your back facing the wall. Place a soft object, about 6-8 inches (15-20 cm) in diameter, between the back of your head and the wall. A soft object could be a small pillow, a ball, or a folded towel. Gently tilt your head back and press into the soft object. Keep your jaw and forehead relaxed. Hold for __________ seconds. Release the tension slowly. Relax your neck muscles completely before you repeat this exercise. Repeat __________ times. Complete this exercise __________ times a day. Posture and body mechanics Body mechanics refers to the movements and positions of your body while you do your daily activities. Posture is part of body mechanics. Good posture and healthy body mechanics can help to relieve stress in your body's tissues and joints. Good posture means that your spine is in its natural S-curve position (your spine is neutral), your shoulders are pulled back slightly, and your head is not tipped forward. The following are general guidelines for applying improved posture and body mechanics to your everyday activities. Sitting  When sitting, keep your spine neutral and keep your feet flat on the floor. Use a footrest, if necessary, and keep your thighs parallel to the floor. Avoid rounding your shoulders, and avoid tilting your head forward. When working at a desk or a computer, keep your desk at a height where your hands are slightly lower than your elbows. Slide your chair under your desk so you are close enough to maintain good posture. When working at a computer, place your monitor at a height where you are looking straight ahead and you do not have to tilt your head forward or downward to look at the screen. Standing  When standing, keep your spine neutral and keep your feet about hip-width apart. Keep a slight bend  in your knees. Your ears, shoulders, and hips should line up. When you do a task in which you stand in one place for a long time, place one foot up on a stable object that is 2-4 inches (5-10 cm) high, such as a footstool. This helps keep your spine neutral. Resting When lying down and resting, avoid positions that are most painful for you. Try to support your neck in a neutral position. You can use a contour pillow or a small rolled-up towel. Your pillow should support your neck but not push on it. This information is not intended to replace advice given to you by your health care provider. Make sure you discuss any questions you have with your health care provider. Document Revised: 04/05/2019 Document Reviewed: 09/14/2018 Elsevier Patient Education  2022 Elsevier Inc. Back Exercises The following exercises strengthen the muscles that help to support the trunk (  torso) and back. They also help to keep the lower back flexible. Doing these exercises can help to prevent or lessen existing low back pain. If you have back pain or discomfort, try doing these exercises 2-3 times each day or as told by your health care provider. As your pain improves, do them once each day, but increase the number of times that you repeat the steps for each exercise (do more repetitions). To prevent the recurrence of back pain, continue to do these exercises once each day or as told by your health care provider. Do exercises exactly as told by your health care provider and adjust them as directed. It is normal to feel mild stretching, pulling, tightness, or discomfort as you do these exercises, but you should stop right away if you feel sudden pain or your pain gets worse. Exercises Single knee to chest Repeat these steps 3-5 times for each leg: Lie on your back on a firm bed or the floor with your legs extended. Bring one knee to your chest. Your other leg should stay extended and in contact with the floor. Hold your  knee in place by grabbing your knee or thigh with both hands and hold. Pull on your knee until you feel a gentle stretch in your lower back or buttocks. Hold the stretch for 10-30 seconds. Slowly release and straighten your leg.  Pelvic tilt Repeat these steps 5-10 times: Lie on your back on a firm bed or the floor with your legs extended. Bend your knees so they are pointing toward the ceiling and your feet are flat on the floor. Tighten your lower abdominal muscles to press your lower back against the floor. This motion will tilt your pelvis so your tailbone points up toward the ceiling instead of pointing to your feet or the floor. With gentle tension and even breathing, hold this position for 5-10 seconds.  Cat-cow Repeat these steps until your lower back becomes more flexible: Get into a hands-and-knees position on a firm bed or the floor. Keep your hands under your shoulders, and keep your knees under your hips. You may place padding under your knees for comfort. Let your head hang down toward your chest. Contract your abdominal muscles and point your tailbone toward the floor so your lower back becomes rounded like the back of a cat. Hold this position for 5 seconds. Slowly lift your head, let your abdominal muscles relax, and point your tailbone up toward the ceiling so your back forms a sagging arch like the back of a cow. Hold this position for 5 seconds.  Press-ups Repeat these steps 5-10 times: Lie on your abdomen (face-down) on a firm bed or the floor. Place your palms near your head, about shoulder-width apart. Keeping your back as relaxed as possible and keeping your hips on the floor, slowly straighten your arms to raise the top half of your body and lift your shoulders. Do not use your back muscles to raise your upper torso. You may adjust the placement of your hands to make yourself more comfortable. Hold this position for 5 seconds while you keep your back relaxed. Slowly  return to lying flat on the floor.  Bridges Repeat these steps 10 times: Lie on your back on a firm bed or the floor. Bend your knees so they are pointing toward the ceiling and your feet are flat on the floor. Your arms should be flat at your sides, next to your body. Tighten your buttocks muscles and lift  your buttocks off the floor until your waist is at almost the same height as your knees. You should feel the muscles working in your buttocks and the back of your thighs. If you do not feel these muscles, slide your feet 1-2 inches (2.5-5 cm) farther away from your buttocks. Hold this position for 3-5 seconds. Slowly lower your hips to the starting position, and allow your buttocks muscles to relax completely. If this exercise is too easy, try doing it with your arms crossed over your chest. Abdominal crunches Repeat these steps 5-10 times: Lie on your back on a firm bed or the floor with your legs extended. Bend your knees so they are pointing toward the ceiling and your feet are flat on the floor. Cross your arms over your chest. Tip your chin slightly toward your chest without bending your neck. Tighten your abdominal muscles and slowly raise your torso high enough to lift your shoulder blades a tiny bit off the floor. Avoid raising your torso higher than that because it can put too much stress on your lower back and does not help to strengthen your abdominal muscles. Slowly return to your starting position.  Back lifts Repeat these steps 5-10 times: Lie on your abdomen (face-down) with your arms at your sides, and rest your forehead on the floor. Tighten the muscles in your legs and your buttocks. Slowly lift your chest off the floor while you keep your hips pressed to the floor. Keep the back of your head in line with the curve in your back. Your eyes should be looking at the floor. Hold this position for 3-5 seconds. Slowly return to your starting position.  Contact a health care  provider if: Your back pain or discomfort gets much worse when you do an exercise. Your worsening back pain or discomfort does not lessen within 2 hours after you exercise. If you have any of these problems, stop doing these exercises right away. Do not do them again unless your health care provider says that you can. Get help right away if: You develop sudden, severe back pain. If this happens, stop doing the exercises right away. Do not do them again unless your health care provider says that you can. This information is not intended to replace advice given to you by your health care provider. Make sure you discuss any questions you have with your health care provider. Document Revised: 06/10/2021 Document Reviewed: 02/26/2021 Elsevier Patient Education  2022 [employer].

## 2021-12-08 ENCOUNTER — Ambulatory Visit (INDEPENDENT_AMBULATORY_CARE_PROVIDER_SITE_OTHER): Payer: BC Managed Care – PPO | Admitting: Physical Therapy

## 2021-12-08 ENCOUNTER — Encounter: Payer: Self-pay | Admitting: Physical Therapy

## 2021-12-08 ENCOUNTER — Other Ambulatory Visit: Payer: Self-pay

## 2021-12-08 DIAGNOSIS — M6281 Muscle weakness (generalized): Secondary | ICD-10-CM | POA: Diagnosis not present

## 2021-12-08 DIAGNOSIS — M542 Cervicalgia: Secondary | ICD-10-CM | POA: Diagnosis not present

## 2021-12-08 DIAGNOSIS — R293 Abnormal posture: Secondary | ICD-10-CM

## 2021-12-08 NOTE — Therapy (Addendum)
[facility] [address] [place], [place], [address] Phone: [phone]   Fax:  [phone]  Physical Therapy Treatment  Patient Details  Name: Christine Ford MRN: [id number] Date of Birth: [birth year] Referring Provider (PT): Magnus Sinning MD  PHYSICAL THERAPY DISCHARGE SUMMARY  Visits from Start of Care: 3  Current functional level related to goals / functional outcomes: See note   Remaining deficits: See note   Education / Equipment: See note   Patient agrees to discharge. Patient goals were partially met. Patient is being discharged due to not returning since the last visit.   Encounter Date: 12/08/2021   PT End of Session - 12/08/21 0929     Visit Number 3    Number of Visits 8    Date for PT Re-Evaluation 12/29/21    Authorization Type BCBS    PT Start Time 0850    PT Stop Time 0925    PT Time Calculation (min) 35 min    Activity Tolerance Patient tolerated treatment well    Behavior During Therapy [facility] for tasks assessed/performed             Past Medical History:  Diagnosis Date   Anxiety    Arthritis    Family history of dementia, mother, age > 45 07/28/2017   Osteopenia    Temporomandibular joint disorder (TMJ)     Past Surgical History:  Procedure Laterality Date   BREAST SURGERY     dental implant  2022   WISDOM TOOTH EXTRACTION      There were no vitals filed for this visit.   Subjective Assessment - 12/08/21 0851     Subjective overall neck has been better - having some Lt sided pain today "It feels like it wants to start."    Pertinent History Osteoporosis    Diagnostic tests none recently    Patient Stated Goals to stop the spasms; get a workout regime    Currently in Pain? Yes    Pain Score 2     Pain Location Neck    Pain Orientation Left    Pain Descriptors / Indicators Aching;Sore;Spasm    Pain Type Acute pain;Chronic pain    Pain Onset More than a month ago    Pain Frequency  Intermittent                [facility] PT Assessment - 12/08/21 0926       Assessment   Medical Diagnosis cervicalgia    Referring Provider (PT) Magnus Sinning MD    Onset Date/Surgical Date 10/13/21    Hand Dominance Right    Prior Therapy with Barbaraann Barthel for past 3 years      Observation/Other Assessments   Focus on Therapeutic Outcomes (FOTO)  40                           [facility] Treatment/Exercise - 12/08/21 0001       Self-Care   Self-Care Other Self-Care Comments    Other Self-Care Comments  use of theracane for self TPR; did spend time discussing current symptoms and triggers as well as differentials.  Feel it is worth discussing cervical dystonia with MD as possible differential and then possible botox injections.  Also discussed heat/ice and benefits.  Pt verbalized understanding      Neck Exercises: Theraband   Rows Green;20 reps    Shoulder External Rotation Green;20 reps      Neck Exercises:  Standing   Other Standing Exercises bent over row 2x10 3# each hand    Other Standing Exercises shoulder flexion, abduction 1# on Lt x 10 reps each                     PT Education - 12/08/21 0929     Education Details see self care    Person(s) Educated Patient    Methods Explanation;Demonstration;Handout    Comprehension Verbalized understanding;Returned demonstration;Need further instruction              PT Short Term Goals - 11/03/21 1850       PT SHORT TERM GOAL #1   Title STG = LTGs               PT Long Term Goals - 12/08/21 0929       PT LONG TERM GOAL #1   Title Pt will be independent with strengthening exercises for home and in the gym and be able to prevent overactivation of UT with these exercises.    Time 8    Period Weeks    Status On-going    Target Date 12/29/21      PT LONG TERM GOAL #2   Title Improved mid/low trap strength to 4+/5 or better to decrease strain on UT and improve posture.     Time 8    Period Weeks    Status On-going      PT LONG TERM GOAL #3   Title Pt will report >/= 75% reduction of spasm in her left UT    Baseline -    Time 8    Period Weeks    Status On-going      PT LONG TERM GOAL #4   Title Pt will have improved cervical Rotation to >= 50 deg to improve function with ADLs    Baseline -    Time 8    Period Weeks    Status On-going      PT LONG TERM GOAL #5   Title improved bil lateral cervical flexion to 25 deg or better without pain    Time 8    Period Weeks    Status On-going                   Plan - 12/08/21 1121     Clinical Impression Statement Pt tolerated session well today which focused on exercise modification to decrease hyperactivity of upper traps.  Given severity of pain and clinical presentation, recommended pt talk to MD about possible cervical dystonia.  Overall at this time she has had limited flare ups and has responded well to PT, and will continue to benefit from PT to maximize function.    Personal Factors and Comorbidities Behavior Pattern;Comorbidity 1    Comorbidities osteoporosis    Stability/Clinical Decision Making Stable/Uncomplicated    Rehab Potential Good    PT Frequency 1x / week    PT Duration 8 weeks    PT Treatment/Interventions ADLs/Self Care Home Management;Cryotherapy;Electrical Stimulation;Moist Heat;Therapeutic exercise;Patient/family education;Dry needling;Neuromuscular re-education;Manual techniques    PT Next Visit Plan DN as indicated; UT/mid/low trap strength; manual/modalities PRN, cont with postural strengthening    PT Home Exercise Plan [id number]    Consulted and Agree with Plan of Care Patient             Patient will benefit from skilled therapeutic intervention in order to improve the following deficits and impairments:  Decreased range of motion, Increased muscle spasms,  Pain, Impaired flexibility, Decreased strength, Postural dysfunction  Visit Diagnosis: Abnormal  posture  Cervicalgia  Muscle weakness (generalized)     Problem List Patient Active Problem List   Diagnosis Date Noted   Myofascial pain syndrome 05/20/2021   Spondylosis without myelopathy or radiculopathy, lumbar region 05/20/2021   Sicca syndrome ([place]) 01/27/2021   Chronic eczematoid otitis externa of both ears 11/01/2020   Other idiopathic scoliosis, lumbar region 09/30/2018   DDD (degenerative disc disease), cervical 05/06/2018   Chronic left shoulder pain 05/06/2018   History of palpitations 07/29/2017   Osteoporosis 07/28/2017   Family history of premature CAD 07/28/2017   Primary insomnia 09/26/2008   GAD (generalized anxiety disorder) 09/28/2007   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

## 2021-12-10 LAB — PROTEIN ELECTROPHORESIS, SERUM, WITH REFLEX
Albumin ELP: 4.6 g/dL (ref 3.8–4.8)
Alpha 1: 0.3 g/dL (ref 0.2–0.3)
Alpha 2: 0.6 g/dL (ref 0.5–0.9)
Beta 2: 0.3 g/dL (ref 0.2–0.5)
Beta Globulin: 0.4 g/dL (ref 0.4–0.6)
Gamma Globulin: 1 g/dL (ref 0.8–1.7)
Total Protein: 7.1 g/dL (ref 6.1–8.1)

## 2021-12-10 LAB — PARATHYROID HORMONE, INTACT (NO CA): PTH: 38 pg/mL (ref 16–77)

## 2021-12-10 LAB — PHOSPHORUS: Phosphorus: 3.3 mg/dL (ref 2.5–4.5)

## 2021-12-10 NOTE — Progress Notes (Signed)
PTH, phosphorus and SPEP are within normal limits.  She may start taking Fosamax.

## 2021-12-23 ENCOUNTER — Encounter: Payer: BC Managed Care – PPO | Admitting: Physical Therapy

## 2021-12-23 DIAGNOSIS — S63641A Sprain of metacarpophalangeal joint of right thumb, initial encounter: Secondary | ICD-10-CM | POA: Diagnosis not present

## 2021-12-23 DIAGNOSIS — M79644 Pain in right finger(s): Secondary | ICD-10-CM | POA: Diagnosis not present

## 2022-01-02 ENCOUNTER — Other Ambulatory Visit: Payer: Self-pay | Admitting: Physical Medicine and Rehabilitation

## 2022-01-02 DIAGNOSIS — S63641D Sprain of metacarpophalangeal joint of right thumb, subsequent encounter: Secondary | ICD-10-CM | POA: Diagnosis not present

## 2022-01-09 ENCOUNTER — Encounter: Payer: Self-pay | Admitting: Family Medicine

## 2022-01-09 ENCOUNTER — Other Ambulatory Visit: Payer: Self-pay

## 2022-01-09 ENCOUNTER — Ambulatory Visit: Payer: BC Managed Care – PPO | Admitting: Family Medicine

## 2022-01-09 VITALS — BP 129/90 | HR 69 | Temp 98.1°F | Ht 64.0 in | Wt 119.4 lb

## 2022-01-09 DIAGNOSIS — R911 Solitary pulmonary nodule: Secondary | ICD-10-CM | POA: Diagnosis not present

## 2022-01-09 DIAGNOSIS — M25512 Pain in left shoulder: Secondary | ICD-10-CM

## 2022-01-09 DIAGNOSIS — M5030 Other cervical disc degeneration, unspecified cervical region: Secondary | ICD-10-CM

## 2022-01-09 DIAGNOSIS — R5383 Other fatigue: Secondary | ICD-10-CM | POA: Diagnosis not present

## 2022-01-09 DIAGNOSIS — G8929 Other chronic pain: Secondary | ICD-10-CM

## 2022-01-09 LAB — CBC WITH DIFFERENTIAL/PLATELET
Basophils Absolute: 0.1 10*3/uL (ref 0.0–0.1)
Basophils Relative: 1.1 % (ref 0.0–3.0)
Eosinophils Absolute: 0.1 10*3/uL (ref 0.0–0.7)
Eosinophils Relative: 1.3 % (ref 0.0–5.0)
HCT: 40.1 % (ref 36.0–46.0)
Hemoglobin: 13.3 g/dL (ref 12.0–15.0)
Lymphocytes Relative: 25.6 % (ref 12.0–46.0)
Lymphs Abs: 1.3 10*3/uL (ref 0.7–4.0)
MCHC: 33.2 g/dL (ref 30.0–36.0)
MCV: 96.1 fl (ref 78.0–100.0)
Monocytes Absolute: 0.4 10*3/uL (ref 0.1–1.0)
Monocytes Relative: 8.6 % (ref 3.0–12.0)
Neutro Abs: 3.3 10*3/uL (ref 1.4–7.7)
Neutrophils Relative %: 63.4 % (ref 43.0–77.0)
Platelets: 237 10*3/uL (ref 150.0–400.0)
RBC: 4.17 Mil/uL (ref 3.87–5.11)
RDW: 13.2 % (ref 11.5–15.5)
WBC: 5.1 10*3/uL (ref 4.0–10.5)

## 2022-01-09 LAB — TSH: TSH: 1.28 u[IU]/mL (ref 0.35–5.50)

## 2022-01-09 NOTE — Progress Notes (Signed)
Subjective  CC:  Chief Complaint  Patient presents with   Fatigue    No energy, Tired all the time for about 2 weeks.    HPI: Christine Ford is a 61 y.o. female who presents to the office today to address the problems listed above in the chief complaint. 60 year old female, healthy lifestyle who complains of worsening fatigue over the last 2 weeks.  She is struggling with energy levels throughout the day.  She reports poor sleep, chronically, and mostly related to chronic pain.  She is seeing rheumatology and is being treated for osteoarthritis.  She is undergoing physical therapy.  She has osteoarthritis.  When she takes Tylenol, her pain and sleep is better.  Anxiety is well controlled on Lexapro.  She is worried about her thyroid: No weight gain but does feel cold all the time.  She is postmenopausal, does not have menstrual cycles and denies melena or GERD.  She is on a biphosphonate.  Of note, she typically was not 3 cups/day coffee drinker.  Over the last 2 weeks she has cut it back to 1 cup/day. Recently had a screening done and had a home fair: Chest CT was reviewed and shows a 6 mm pulmonary nodule.  Assessment  1. Fatigue, unspecified type   2. DDD (degenerative disc disease), cervical   3. Chronic left shoulder pain   4. Pulmonary nodule      Plan  Fatigue: Likely multifactorial and mostly related to poor sleep, sleep deprivation and change in caffeine intake.  Counseling done.  We will start Tylenol Extra Strength nightly to help with pain and sleep.  May adjust caffeine intake as needed.  We will check thyroid and CBC.  No other red flag symptoms at this time.  Reassured Pulmonary nodule: Likely benign but will need chest CT in 12 months.  Follow up: As scheduled for complete physical 03/09/2022  Orders Placed This Encounter  Procedures   CBC with Differential/Platelet   TSH   No orders of the defined types were placed in this encounter.     I reviewed the patients  updated PMH, FH, and SocHx.    Patient Active Problem List   Diagnosis Date Noted   DDD (degenerative disc disease), cervical 05/06/2018    Priority: High   Osteoporosis 07/28/2017    Priority: High   Primary insomnia 09/26/2008    Priority: High   GAD (generalized anxiety disorder) 09/28/2007    Priority: High   Sicca syndrome ([place]) 01/27/2021    Priority: Medium    Chronic left shoulder pain 05/06/2018    Priority: Medium    Family history of premature CAD 07/28/2017    Priority: Medium    Other idiopathic scoliosis, lumbar region 09/30/2018    Priority: Low   History of palpitations 07/29/2017    Priority: Low   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 Take with a full glass of water on an empty stomach.   Cholecalciferol (VITAMIN D3) 1000 units CAPS    escitalopram (LEXAPRO) 10 MG tablet TAKE 1 TABLET(10 MG) BY MOUTH DAILY   Multiple Vitamins-Minerals (MULTIVITAMIN,TX-MINERALS) tablet Take 1 tablet by mouth daily.    Allergies: Patient has No  Known Allergies. Family History: Patient family history includes Alzheimer's disease in her mother; Breast cancer in her cousin and another family member; COPD in her mother; Coronary artery disease (age of onset: 72) in her mother; Glaucoma in her son; Healthy in her son and son; Heart attack (age of onset: 20) in her father; Heart disease in her father and sister; Hypertension in her father and mother; Lung cancer in her cousin, maternal aunt, and another family member; Osteoporosis in her mother. Social History:  Patient  reports that she has never smoked. She has never used smokeless tobacco. She reports current alcohol use. She reports that  she does not use drugs.  Review of Systems: Constitutional: Negative for fever malaise or anorexia Cardiovascular: negative for chest pain Respiratory: negative for SOB or persistent cough Gastrointestinal: negative for abdominal pain  Objective  Vitals: BP 129/90    Pulse 69    Temp 98.1 F (36.7 C) (Temporal)    Ht 5\' 4"  (1.626 m)    Wt 119 lb 6.4 oz (54.2 kg)    SpO2 98%    BMI 20.49 kg/m  General: no acute distress , A&Ox3 HEENT: PEERL, conjunctiva normal, neck is supple Cardiovascular:  RRR without murmur or gallop.  Respiratory:  Good breath sounds bilaterally, CTAB with normal respiratory effort No epigastric tenderness Skin:  Warm, no rashes    Commons side effects, risks, benefits, and alternatives for medications and treatment plan prescribed today were discussed, and the patient expressed understanding of the given instructions. Patient is instructed to call or message via MyChart if he/she has any questions or concerns regarding our treatment plan. No barriers to understanding were identified. We discussed Red Flag symptoms and signs in detail. Patient expressed understanding regarding what to do in case of urgent or emergency type symptoms.  Medication list was reconciled, printed and provided to the patient in AVS. Patient instructions and summary information was reviewed with the patient as documented in the AVS. This note was prepared with assistance of Dragon voice recognition software. Occasional wrong-word or sound-a-like substitutions may have occurred due to the inherent limitations of voice recognition software  This visit occurred during the SARS-CoV-2 public health emergency.  Safety protocols were in place, including screening questions prior to the visit, additional usage of staff PPE, and extensive cleaning of exam room while observing appropriate contact time as indicated for disinfecting solutions.

## 2022-01-09 NOTE — Patient Instructions (Signed)
Please follow up as scheduled for your next visit with me: 03/09/2022   If you have any questions or concerns, please don't hesitate to send me a message via MyChart or call the office at [phone]. Thank you for visiting with [place] today! It's our pleasure caring for you.

## 2022-01-13 DIAGNOSIS — M542 Cervicalgia: Secondary | ICD-10-CM | POA: Diagnosis not present

## 2022-01-16 DIAGNOSIS — F4323 Adjustment disorder with mixed anxiety and depressed mood: Secondary | ICD-10-CM | POA: Diagnosis not present

## 2022-01-22 ENCOUNTER — Other Ambulatory Visit: Payer: Self-pay | Admitting: Family Medicine

## 2022-01-22 ENCOUNTER — Other Ambulatory Visit: Payer: Self-pay

## 2022-01-22 MED ORDER — ESCITALOPRAM OXALATE 10 MG PO TABS: ORAL_TABLET | ORAL | 3 refills | Status: DC

## 2022-01-30 DIAGNOSIS — F4323 Adjustment disorder with mixed anxiety and depressed mood: Secondary | ICD-10-CM | POA: Diagnosis not present

## 2022-02-06 DIAGNOSIS — M533 Sacrococcygeal disorders, not elsewhere classified: Secondary | ICD-10-CM | POA: Diagnosis not present

## 2022-02-06 DIAGNOSIS — M5451 Vertebrogenic low back pain: Secondary | ICD-10-CM | POA: Diagnosis not present

## 2022-02-13 DIAGNOSIS — F4323 Adjustment disorder with mixed anxiety and depressed mood: Secondary | ICD-10-CM | POA: Diagnosis not present

## 2022-02-20 DIAGNOSIS — Z01419 Encounter for gynecological examination (general) (routine) without abnormal findings: Secondary | ICD-10-CM | POA: Diagnosis not present

## 2022-02-20 DIAGNOSIS — F4323 Adjustment disorder with mixed anxiety and depressed mood: Secondary | ICD-10-CM | POA: Diagnosis not present

## 2022-02-20 DIAGNOSIS — Z1389 Encounter for screening for other disorder: Secondary | ICD-10-CM | POA: Diagnosis not present

## 2022-02-20 DIAGNOSIS — Z1231 Encounter for screening mammogram for malignant neoplasm of breast: Secondary | ICD-10-CM | POA: Diagnosis not present

## 2022-02-20 NOTE — Progress Notes (Signed)
Office Visit Note  Patient: Christine Ford             Date of Birth: September 26, 1961           MRN: [id number]             PCP: Leamon Arnt, MD Referring: Leamon Arnt, MD Visit Date: 03/06/2022 Occupation: @GUAROCC @  Subjective:  Medication monitoring  History of Present Illness: Christine Ford is a 61 y.o. female with history of osteoporosis and DDD. She is taking fosamax 70 mg 1 tablet by mouth once weekly.  She was started on Fosamax after her last office visit on 12/05/2021.  She has been tolerating Fosamax without any side effects and has been following the dosing instructions as directed.  She continues to take a calcium and vitamin D supplement daily.  She denies any recent falls or fractures.  She has started practicing yoga for exercise.   Patient reports that since her last office visit she had a flare in the right SI joint.  [facility] and was given a prednisone taper which alleviated her symptoms.  She denies any other joint pain or inflammation at this time.  She denies any other new concerns.   Activities of Daily Living:  Patient reports morning stiffness for 15-30 minutes.   Patient Reports nocturnal pain.  Difficulty dressing/grooming: Denies Difficulty climbing stairs: Denies Difficulty getting out of chair: Reports Difficulty using hands for taps, buttons, cutlery, and/or writing: Denies  Review of Systems  Constitutional:  Negative for fatigue.  HENT:  Positive for mouth dryness. Negative for mouth sores and nose dryness.   Eyes:  Positive for dryness. Negative for pain and itching.  Respiratory:  Negative for shortness of breath and difficulty breathing.   Cardiovascular:  Negative for chest pain and palpitations.  Gastrointestinal:  Negative for blood in stool, constipation and diarrhea.  Endocrine: Negative for increased urination.  Genitourinary:  Negative for difficulty urinating.  Musculoskeletal:  Positive for joint pain,  joint pain and morning stiffness. Negative for joint swelling, myalgias, muscle tenderness and myalgias.  Skin:  Negative for color change, rash and redness.  Allergic/Immunologic: Negative for susceptible to infections.  Neurological:  Negative for dizziness, numbness, headaches, memory loss and weakness.  Hematological:  Negative for bruising/bleeding tendency.  Psychiatric/Behavioral:  Negative for confusion.    PMFS History:  Patient Active Problem List   Diagnosis Date Noted   Pulmonary nodule 01/09/2022   Myofascial pain syndrome 05/20/2021   Spondylosis without myelopathy or radiculopathy, lumbar region 05/20/2021   Sicca syndrome ([place]) 01/27/2021   Chronic eczematoid otitis externa of both ears 11/01/2020   Other idiopathic scoliosis, lumbar region 09/30/2018   DDD (degenerative disc disease), cervical 05/06/2018   Chronic left shoulder pain 05/06/2018   History of palpitations 07/29/2017   Osteoporosis 07/28/2017   Family history of premature CAD 07/28/2017   Primary insomnia 09/26/2008   GAD (generalized anxiety disorder) 09/28/2007   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    Past Medical History:  Diagnosis Date   Anxiety    Arthritis    Family history of dementia, mother, age > 36 07/28/2017   Osteopenia    Temporomandibular joint disorder (TMJ)     Family History  Problem Relation Age of Onset   Coronary artery disease Mother 22   Hypertension Mother    Osteoporosis Mother    COPD Mother    Alzheimer's disease Mother    Hypertension Father    Heart attack Father  36   Heart disease Father    Glaucoma Son    Healthy Son    Breast cancer Other    Lung cancer Other    Lung cancer Maternal Aunt    Heart disease Sister    Healthy Son    Lung cancer Cousin    Breast cancer Cousin    Past Surgical History:  Procedure Laterality Date   BREAST SURGERY     dental implant  2022   WISDOM TOOTH EXTRACTION     Social History   Social History Narrative   Occupation: Investment banker, operational, 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  3 sons, ages 41, 11, and 47.  Used to exercise at the [facility] but now walks at lunch for exercise due to COVID restrictions.  Diet with no absolute restrictions but naturally decreases gluten and red meat.  History of palpitations that was worked up and found to be related to stress.  Parents smoked and so had long secondhand smoke exposure growing up.      Immunization History  Administered Date(s) Administered   Influenza,inj,Quad PF,6+ Mos 11/22/2018, 09/15/2019   Influenza-Unspecified 09/12/2020   PFIZER(Purple Top)SARS-COV-2 Vaccination 03/02/2020, 03/23/2020   PPD Test 10/22/2014, 11/05/2014   Td 12/03/2006   Tdap 12/19/2016   Zoster Recombinat (Shingrix) 03/07/2021, 08/07/2021     Objective: Vital Signs: BP 136/81 (BP Location: Left Arm, Patient Position: Sitting, Cuff Size: Normal)    Pulse 65    Ht 5\' 4"  (1.626 m)    Wt 118 lb (53.5 kg)    BMI 20.25 kg/m    Physical Exam Vitals and nursing note reviewed.  Constitutional:      Appearance: She is well-developed.  HENT:     Head: Normocephalic and atraumatic.  Eyes:     Conjunctiva/sclera: Conjunctivae normal.  Cardiovascular:     Rate and Rhythm: Normal rate and regular rhythm.     Heart sounds: Normal heart sounds.  Pulmonary:     Effort: Pulmonary effort is normal.     Breath sounds: Normal breath sounds.  Abdominal:     General: Bowel sounds are normal.     Palpations: Abdomen is soft.  Musculoskeletal:     Cervical back: Normal range of motion.  Skin:    General: Skin is warm and dry.     Capillary Refill: Capillary refill takes less than 2 seconds.  Neurological:     Mental Status: She is alert and oriented to person, place, and time.  Psychiatric:        Behavior: Behavior normal.     Musculoskeletal Exam: C-spine, thoracic spine, and lumbar spine good ROM.  No midline spinal tenderness.  No SI joint tenderness. Shoulder joints, elbow joints, wrist  joints, MCPs, PIPs, and DIPs good ROM with no synovitis.  Hip joints, knee joints, and ankle joints have good ROM with no discomfort.  No warmth or effusion of knee joints.  No tenderness or swelling of ankle joints.   CDAI Exam: CDAI Score: -- Patient Global: --; Provider Global: -- Swollen: --; Tender: -- Joint Exam 03/06/2022   No joint exam has been documented for this visit   There is currently no information documented on the homunculus. Go to the Rheumatology activity and complete the homunculus joint exam.  Investigation: No additional findings.  Imaging: No results found.  Recent Labs: Lab Results  Component Value Date   WBC 5.1 01/09/2022   HGB 13.3 01/09/2022   PLT 237.0 01/09/2022  NA 140 11/14/2021   K 4.1 11/14/2021   CL 103 11/14/2021   CO2 29 11/14/2021   GLUCOSE 76 11/14/2021   BUN 17 11/14/2021   CREATININE 0.81 11/14/2021   BILITOT 0.5 11/14/2021   ALKPHOS 51 03/07/2021   AST 25 11/14/2021   ALT 17 11/14/2021   PROT 7.1 12/05/2021   ALBUMIN 4.4 03/07/2021   CALCIUM 9.6 11/14/2021   GFRAA 98 04/04/2018    Speciality Comments: No specialty comments available.  Procedures:  No procedures performed Allergies: Patient has no known allergies.   Assessment / Plan:     Visit Diagnoses: Other osteoporosis without current pathological fracture - DEXA 03/14/2021: T-score left femoral neck -2.6, -0.6% change from previous BMD. AP spine T-score -2.2.  -3.5% decrease in BMD of spine.  Results were reviewed again today with the patient today in the office and all questions were addressed.  She was started on Fosamax 70 mg 1 tablet by mouth once weekly after her last office visit on 12/05/2021.  She has been following the dosing instructions as directed.  She has not had any side effects since starting on Fosamax.  She continues to take a vitamin D and calcium supplement daily as advised.  She has not had any recent falls or fractures.  She will remain on Fosamax as  prescribed.  She has upcoming lab work with her PCP and was encouraged to have CBC, CMP, and vitamin D checked at that time.  Her next bone density will be due in March 2024.  She will follow-up in the office in 6 months.- Plan: COMPLETE METABOLIC PANEL WITH GFR, CBC with Differential/Platelet, VITAMIN D 25 Hydroxy (Vit-D Deficiency, Fractures)  Medication monitoring encounter -CBC and CMP WNL on 11/14/21.  Vitamin D within the desirable range: 66 on 11/14/21. She has upcoming lab work scheduled in 2 weeks.   Plan: COMPLETE METABOLIC PANEL WITH GFR, CBC with Differential/Platelet, VITAMIN D 25 Hydroxy (Vit-D Deficiency, Fractures)  DDD (degenerative disc disease), cervical - MRI of the C-spine on 04/19/18, which revealed multilevel degenerative disc disease and facet arthorsis.  She had C7-T1 right-sided foraminal narrowing. Her neck pain and stiffness has improved.  No symptoms of radiculopathy at this time.  She has started practicing yoga for exercise.   Trapezius muscle spasm: Improved.  She had trigger point injections performed on 10/13/2021 which provided significant relief.  Other idiopathic scoliosis, lumbar region: She experiences intermittent discomfort in her lower back.  Discussed the importance of core strengthening.  She has started to practice yoga for exercise.  Lateral epicondylitis, right elbow: Resolved.   Piriformis syndrome of right side: Resolved.   Chronic right SI joint pain: Since her last office visit she had a flare of pain in her right SI joint.  [facility] and was given a prednisone taper which alleviated her symptoms.  She has started to practice yoga for exercise.  Discussed the importance of proper posture and changing positions frequently.  Sicca syndrome ([facility]) - +ANA 1:160 NS, -Ro,-LA, -RF. Her mother has history of Sjogren's syndrome.  Her sicca symptoms have been tolerable overall.  She continues to see the dentist every 6 months and  has not had any recent dental caries.  Discussed the importance of proper oral hygiene.    Orders: Orders Placed This Encounter  Procedures   COMPLETE METABOLIC PANEL WITH GFR   CBC with Differential/Platelet   VITAMIN D 25 Hydroxy (Vit-D Deficiency, Fractures)   No orders of the  defined types were placed in this encounter.    Follow-Up Instructions: Return in about 6 months (around 09/06/2022) for Osteoporosis, DDD.   Ofilia Neas, PA-C  Note - This record has been created using Dragon software.  Chart creation errors have been sought, but may not always  have been located. Such creation errors do not reflect on  the standard of medical care.

## 2022-03-02 DIAGNOSIS — M533 Sacrococcygeal disorders, not elsewhere classified: Secondary | ICD-10-CM | POA: Diagnosis not present

## 2022-03-06 ENCOUNTER — Other Ambulatory Visit: Payer: Self-pay

## 2022-03-06 ENCOUNTER — Encounter: Payer: Self-pay | Admitting: Physician Assistant

## 2022-03-06 ENCOUNTER — Ambulatory Visit (INDEPENDENT_AMBULATORY_CARE_PROVIDER_SITE_OTHER): Payer: BC Managed Care – PPO | Admitting: Physician Assistant

## 2022-03-06 VITALS — BP 136/81 | HR 65 | Ht 64.0 in | Wt 118.0 lb

## 2022-03-06 DIAGNOSIS — M533 Sacrococcygeal disorders, not elsewhere classified: Secondary | ICD-10-CM

## 2022-03-06 DIAGNOSIS — M818 Other osteoporosis without current pathological fracture: Secondary | ICD-10-CM

## 2022-03-06 DIAGNOSIS — M62838 Other muscle spasm: Secondary | ICD-10-CM | POA: Diagnosis not present

## 2022-03-06 DIAGNOSIS — G8929 Other chronic pain: Secondary | ICD-10-CM

## 2022-03-06 DIAGNOSIS — M4126 Other idiopathic scoliosis, lumbar region: Secondary | ICD-10-CM | POA: Diagnosis not present

## 2022-03-06 DIAGNOSIS — M5030 Other cervical disc degeneration, unspecified cervical region: Secondary | ICD-10-CM

## 2022-03-06 DIAGNOSIS — G5701 Lesion of sciatic nerve, right lower limb: Secondary | ICD-10-CM

## 2022-03-06 DIAGNOSIS — Z5181 Encounter for therapeutic drug level monitoring: Secondary | ICD-10-CM

## 2022-03-06 DIAGNOSIS — M3500 Sicca syndrome, unspecified: Secondary | ICD-10-CM

## 2022-03-06 DIAGNOSIS — M7711 Lateral epicondylitis, right elbow: Secondary | ICD-10-CM

## 2022-03-09 ENCOUNTER — Encounter: Payer: BC Managed Care – PPO | Admitting: Family Medicine

## 2022-03-13 DIAGNOSIS — M533 Sacrococcygeal disorders, not elsewhere classified: Secondary | ICD-10-CM | POA: Diagnosis not present

## 2022-03-16 ENCOUNTER — Ambulatory Visit (INDEPENDENT_AMBULATORY_CARE_PROVIDER_SITE_OTHER): Payer: BC Managed Care – PPO | Admitting: Family Medicine

## 2022-03-16 ENCOUNTER — Encounter: Payer: Self-pay | Admitting: Family Medicine

## 2022-03-16 VITALS — BP 121/81 | HR 63 | Temp 98.2°F | Ht 64.0 in | Wt 118.2 lb

## 2022-03-16 DIAGNOSIS — Z0000 Encounter for general adult medical examination without abnormal findings: Secondary | ICD-10-CM | POA: Diagnosis not present

## 2022-03-16 DIAGNOSIS — M3500 Sicca syndrome, unspecified: Secondary | ICD-10-CM

## 2022-03-16 DIAGNOSIS — F411 Generalized anxiety disorder: Secondary | ICD-10-CM

## 2022-03-16 DIAGNOSIS — F5101 Primary insomnia: Secondary | ICD-10-CM

## 2022-03-16 DIAGNOSIS — M818 Other osteoporosis without current pathological fracture: Secondary | ICD-10-CM

## 2022-03-16 DIAGNOSIS — R911 Solitary pulmonary nodule: Secondary | ICD-10-CM | POA: Diagnosis not present

## 2022-03-16 LAB — COMPREHENSIVE METABOLIC PANEL
ALT: 14 U/L (ref 0–35)
AST: 25 U/L (ref 0–37)
Albumin: 4.4 g/dL (ref 3.5–5.2)
Alkaline Phosphatase: 36 U/L — ABNORMAL LOW (ref 39–117)
BUN: 13 mg/dL (ref 6–23)
CO2: 28 mEq/L (ref 19–32)
Calcium: 9.1 mg/dL (ref 8.4–10.5)
Chloride: 103 mEq/L (ref 96–112)
Creatinine, Ser: 0.74 mg/dL (ref 0.40–1.20)
GFR: 87.81 mL/min (ref >60.00)
Glucose, Bld: 85 mg/dL (ref 70–99)
Potassium: 3.8 mEq/L (ref 3.5–5.1)
Sodium: 137 mEq/L (ref 135–145)
Total Bilirubin: 0.7 mg/dL (ref 0.2–1.2)
Total Protein: 6.9 g/dL (ref 6.0–8.3)

## 2022-03-16 LAB — LIPID PANEL
Cholesterol: 194 mg/dL (ref 0–200)
HDL: 72.2 mg/dL (ref >39.00)
LDL Cholesterol: 105 mg/dL — ABNORMAL HIGH (ref 0–99)
NonHDL: 122.1
Total CHOL/HDL Ratio: 3
Triglycerides: 84 mg/dL (ref 0.0–149.0)
VLDL: 16.8 mg/dL (ref 0.0–40.0)

## 2022-03-16 LAB — C-REACTIVE PROTEIN: CRP: <1 mg/dL (ref 0.5–20.0)

## 2022-03-16 NOTE — Patient Instructions (Signed)
Please return in 12 months for your annual complete physical; please come fasting.   I will release your lab results to you on your MyChart account with further instructions. You may see the results before I do, but when I review them I will send you a message with my report or have my assistant call you if things need to be discussed. Please reply to my message with any questions. Thank you!   If you have any questions or concerns, please don't hesitate to send me a message via MyChart or call the office at [phone]. Thank you for visiting with us today! It's our pleasure caring for you.   Please do these things to maintain good health!  Exercise at least 30-45 minutes a day,  4-5 days a week.  Eat a low-fat diet with lots of fruits and vegetables, up to 7-9 servings per day. Drink plenty of water daily. Try to drink 8 8oz glasses per day. Seatbelts can save your life. Always wear your seatbelt. Place Smoke Detectors on every level of your home and check batteries every year. Schedule an appointment with an eye doctor for an eye exam every 1-2 years Safe sex - use condoms to protect yourself from STDs if you could be exposed to these types of infections. Use birth control if you do not want to become pregnant and are sexually active. Avoid heavy alcohol use. If you drink, keep it to less than 2 drinks/day and not every day. Health Care Power of Attorney.  Choose someone you trust that could speak for you if you became unable to speak for yourself. Depression is common in our stressful world.If you're feeling down or losing interest in things you normally enjoy, please come in for a visit. If anyone is threatening or hurting you, please get help. Physical or Emotional Violence is never OK.   

## 2022-03-16 NOTE — Progress Notes (Signed)
Subjective  Chief Complaint  Patient presents with   Annual Exam    Pt is fasting, and states she has no concerns. She would like lab work sent to Rheumatologist.    Anxiety   Osteoporosis   Insomnia    HPI: Christine Ford is a 61 y.o. female who presents to [facility] [place] today for a Female Wellness Visit. She also has the concerns and/or needs as listed above in the chief complaint. These will be addressed in addition to the Health Maintenance Visit.   Wellness Visit: annual visit with health maintenance review and exam without Pap  HM: sees gyn. Screens current. No new concerns Chronic disease f/u and/or acute problem visit: (deemed necessary to be done in addition to the wellness visit): GAD on lexapro 10 and well controlled.  Osteoporosis on fosamax: managed by rheum. Premenopausal.  Pulm nodule: incidental. F/u scan recommended jan 2024. Asymptomatic. Has second hand smoke exposure history and remote smoker.   Assessment  1. Annual physical exam   2. Other osteoporosis without current pathological fracture   3. Primary insomnia   4. Sicca syndrome (HCC)   5. Pulmonary nodule   6. GAD (generalized anxiety disorder)      Plan  Female Wellness Visit: Age appropriate Health Maintenance and Prevention measures were discussed with patient. Included topics are cancer screening recommendations, ways to keep healthy (see AVS) including dietary and exercise recommendations, regular eye and dental care, use of seat belts, and avoidance of moderate alcohol use and tobacco use.  BMI: discussed patient's BMI and encouraged positive lifestyle modifications to help get to or maintain a target BMI. HM needs and immunizations were addressed and ordered. See below for orders. See HM and immunization section for updates. Routine labs and screening tests ordered including cmp, cbc and lipids where appropriate. Discussed recommendations regarding Vit D and calcium  supplementation (see AVS)  Chronic disease management visit and/or acute problem visit: Osteoporosis: on fosamax since 09/2020 ... continue. Due repeat dexa next year Insomnia and mood: well controlled contine lexapro. Discussed counseling since stressful transition this year: empty nester, marital relationship, and transitioning out of career ...  Sicca syndrome per rheum. Pt request sCRP Pulm nodule: will repeat ct scan jan 2024.  Follow up: Return in about 1 year (around 03/17/2023) for complete physical.  Orders Placed This Encounter  Procedures   Comprehensive metabolic panel   Lipid panel   No orders of the defined types were placed in this encounter.     Body mass index is 20.29 kg/m. Wt Readings from Last 3 Encounters:  03/16/22 118 lb 3.2 oz (53.6 kg)  03/06/22 118 lb (53.5 kg)  01/09/22 119 lb 6.4 oz (54.2 kg)     Patient Active Problem List   Diagnosis Date Noted   Spondylosis without myelopathy or radiculopathy, lumbar region 05/20/2021    Priority: High   DDD (degenerative disc disease), cervical 05/06/2018    Priority: High    Multilevel spondylosis was noted with C4-5, C5-6, C6-7 narrowing.   Anterior spurring and posterior spurring was noted.  Facet joint arthropathy was noted.      Osteoporosis 07/28/2017    Priority: High    Managed by rheum. Dr. Corliss Skains Last DEXA 2022: T = - 2.6 lowest. On fosamax  Original dx by [facility].     Primary insomnia 09/26/2008    Priority: High    Previous evaluation by sleep medicine.  Thought to be due to psychophysiologic insomnia, however  also consistent with TMJ and anxiety.  CBT was recommended as well as follow-up with dentist.    GAD (generalized anxiety disorder) 09/28/2007    Priority: High   Sicca syndrome (HCC) 01/27/2021    Priority: Medium     She has chronic sicca symptoms. +ANA 1:160 NS, -Ro,-LA, -RF. Her mother has history of Sjogren's syndrome.    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  Recommend follow-up in 12 months.    Myofascial pain syndrome 05/20/2021   Chronic eczematoid otitis externa of both ears 11/01/2020   Health Maintenance  Topic Date Due   MAMMOGRAM  01/17/2022   COVID-19 Vaccine (3 - Booster for Pfizer series) 04/01/2022 (Originally 05/18/2020)   DEXA SCAN  03/15/2023   PAP SMEAR-Modifier  10/19/2024   TETANUS/TDAP  12/19/2026   COLONOSCOPY (Pts 45-72yrs Insurance coverage will need to be confirmed)  05/17/2031   Hepatitis C Screening  Completed   HIV Screening  Completed   Zoster Vaccines- Shingrix  Completed   HPV VACCINES  Aged Out   INFLUENZA VACCINE  Discontinued   Immunization History  Administered Date(s) Administered   Influenza,inj,Quad PF,6+ Mos 11/22/2018, 09/15/2019   Influenza-Unspecified 09/12/2020   PFIZER(Purple Top)SARS-COV-2 Vaccination 03/02/2020, 03/23/2020   PPD Test 10/22/2014, 11/05/2014   Td 12/03/2006   Tdap 12/19/2016   Zoster Recombinat (Shingrix) 03/07/2021, 08/07/2021   We updated and reviewed the patient's past history in detail and it is documented below. Allergies: Patient has No Known Allergies. Past Medical History Patient  has a past medical history of Anxiety, Arthritis, Family history of dementia, mother, age > 54 (07/28/2017), Osteopenia, and Temporomandibular joint disorder (TMJ). Past Surgical History Patient  has a past surgical history that includes Breast surgery; Wisdom tooth extraction; and dental implant (2022). Family History: Patient family history includes  Alzheimer's disease in her mother; Breast cancer in her cousin and another family member; COPD in her mother; Coronary artery disease (age of onset: 60) in her mother; Glaucoma in her son; Healthy in her son and son; Heart attack (age of onset: 46) in her father; Heart disease in her father and sister; Hypertension in her father and mother; Lung cancer in her cousin, maternal aunt, and another family member; Osteoporosis in her mother. Social History:  Patient  reports that she has never smoked. She has never used smokeless tobacco. She reports current alcohol use. She reports that she does not use drugs.  Review of Systems: Constitutional: negative for fever or malaise Ophthalmic: negative for photophobia, double vision or loss of vision Cardiovascular: negative for chest pain, dyspnea on exertion, or new LE swelling Respiratory: negative for SOB or persistent cough Gastrointestinal: negative for abdominal pain, change in bowel habits or melena Genitourinary: negative for dysuria or gross hematuria, no abnormal uterine bleeding or disharge Musculoskeletal: negative for new gait disturbance or muscular weakness Integumentary: negative for new or persistent rashes, no breast lumps Neurological: negative for TIA or stroke symptoms Psychiatric: negative for SI  or delusions Allergic/Immunologic: negative for hives  Patient Care Team    Relationship Specialty Notifications Start End  Willow Ora, MD PCP - General Family Medicine  01/27/21   Huel Cote, MD Consulting Physician Obstetrics and Gynecology  11/23/11   Pollyann Savoy, MD Consulting Physician Rheumatology  07/28/19   Ermalinda Barrios, MD Attending Physician Otolaryngology  03/07/21     Objective  Vitals: BP 121/81   Pulse 63   Temp 98.2 F (36.8 C) (Temporal)   Ht 5\' 4"  (1.626 m)   Wt 118 lb 3.2 oz (53.6 kg)   SpO2 100%   BMI 20.29 kg/m  General:  Well developed, well nourished, no acute distress  Psych:  Alert and  orientedx3,normal mood and affect HEENT:  Normocephalic, atraumatic, non-icteric sclera,  supple neck without adenopathy, mass or thyromegaly Cardiovascular:  Normal S1, S2, RRR without gallop, rub or murmur Respiratory:  Good breath sounds bilaterally, CTAB with normal respiratory effort Gastrointestinal: normal bowel sounds, soft, non-tender, no noted masses. No HSM MSK: no deformities, contusions. Joints are without erythema or swelling.  Skin:  Warm, no rashes or suspicious lesions noted Neurologic:    Mental status is normal. CN 2-11 are normal. Gross motor and sensory exams are normal. Normal gait. No tremor   Commons side effects, risks, benefits, and alternatives for medications and treatment plan prescribed today were discussed, and the patient expressed understanding of the given instructions. Patient is instructed to call or message via MyChart if he/she has any questions or concerns regarding our treatment plan. No barriers to understanding were identified. We discussed Red Flag symptoms and signs in detail. Patient expressed understanding regarding what to do in case of urgent or emergency type symptoms.  Medication list was reconciled, printed and provided to the patient in AVS. Patient instructions and summary information was reviewed with the patient as documented in the AVS. This note was prepared with assistance of Dragon voice recognition software. Occasional wrong-word or sound-a-like substitutions may have occurred due to the inherent limitations of voice recognition software  This visit occurred during the SARS-CoV-2 public health emergency.  Safety protocols were in place, including screening questions prior to the visit, additional usage of staff PPE, and extensive cleaning of exam room while observing appropriate contact time as indicated for disinfecting solutions.

## 2022-03-20 DIAGNOSIS — M533 Sacrococcygeal disorders, not elsewhere classified: Secondary | ICD-10-CM | POA: Diagnosis not present

## 2022-03-27 ENCOUNTER — Telehealth: Payer: Self-pay

## 2022-03-27 DIAGNOSIS — M62838 Other muscle spasm: Secondary | ICD-10-CM

## 2022-03-27 NOTE — Telephone Encounter (Signed)
Patient called stating she is experiencing pain in her trapezius and trying to manage the pain with lidocaine patches, tens unit, and Meloxicam.  Patient states she has been receiving dry needling for her SI joint and thinks it might help her trapezius pain and stiffness.  Patient requested a PT referral for dry needling to be sent to [facility] PT Dr. Sallee Lange. ?

## 2022-03-27 NOTE — Telephone Encounter (Signed)
Referral placed.

## 2022-04-03 ENCOUNTER — Encounter: Payer: Self-pay | Admitting: Family Medicine

## 2022-04-03 DIAGNOSIS — R911 Solitary pulmonary nodule: Secondary | ICD-10-CM

## 2022-04-06 ENCOUNTER — Other Ambulatory Visit: Payer: Self-pay

## 2022-04-06 DIAGNOSIS — M533 Sacrococcygeal disorders, not elsewhere classified: Secondary | ICD-10-CM | POA: Diagnosis not present

## 2022-04-09 NOTE — Progress Notes (Deleted)
Office Visit Note  Patient: Christine Ford             Date of Birth: 1961-09-12           MRN: [id number]             PCP: Willow Ora, MD Referring: Willow Ora, MD Visit Date: 04/17/2022 Occupation: @GUAROCC @  Subjective:  No chief complaint on file.   History of Present Illness: Christine Ford is a 61 y.o. female ***   Activities of Daily Living:  Patient reports morning stiffness for *** {minute/hour:19697}.   Patient {ACTIONS;DENIES/REPORTS:21021675::"Denies"} nocturnal pain.  Difficulty dressing/grooming: {ACTIONS;DENIES/REPORTS:21021675::"Denies"} Difficulty climbing stairs: {ACTIONS;DENIES/REPORTS:21021675::"Denies"} Difficulty getting out of chair: {ACTIONS;DENIES/REPORTS:21021675::"Denies"} Difficulty using hands for taps, buttons, cutlery, and/or writing: {ACTIONS;DENIES/REPORTS:21021675::"Denies"}  No Rheumatology ROS completed.   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 11/01/2020   Other idiopathic scoliosis, lumbar region 09/30/2018   DDD (degenerative disc disease), cervical 05/06/2018   Chronic left shoulder pain 05/06/2018   History of palpitations 07/29/2017   Osteoporosis 07/28/2017   Family history of premature CAD 07/28/2017   Primary insomnia 09/26/2008   GAD (generalized anxiety disorder) 09/28/2007   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    Past Medical History:  Diagnosis Date   Anxiety    Arthritis    Family history of dementia, mother, age > 1 07/28/2017   Osteopenia    Temporomandibular joint disorder (TMJ)     Family History  Problem Relation Age of Onset   Coronary artery disease Mother 7   Hypertension Mother    Osteoporosis Mother    COPD Mother    Alzheimer's disease Mother     Hypertension Father    Heart attack Father 40   Heart disease Father    Glaucoma Son    Healthy Son    Breast cancer Other    Lung cancer Other    Lung cancer Maternal Aunt    Heart disease Sister    Healthy Son    Lung cancer Cousin    Breast cancer Cousin    Past Surgical History:  Procedure Laterality Date   BREAST SURGERY     dental implant  2022   WISDOM TOOTH EXTRACTION     Social History   Social History Narrative   Occupation: 2023, 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  3 sons   Immunization History  Administered Date(s) Administered   Influenza,inj,Quad PF,6+ Mos 11/22/2018, 09/15/2019   Influenza-Unspecified 09/12/2020   PFIZER(Purple Top)SARS-COV-2 Vaccination 03/02/2020, 03/23/2020   PPD Test 10/22/2014, 11/05/2014   Td 12/03/2006   Tdap 12/19/2016   Zoster Recombinat (Shingrix) 03/07/2021, 08/07/2021     Objective: Vital Signs: There were no vitals taken for this visit.   Physical Exam   Musculoskeletal Exam: ***  CDAI Exam: CDAI Score: -- Patient Global: --; Provider Global: -- Swollen: --; Tender: -- Joint Exam 04/17/2022   No joint exam has been documented for this visit   There is currently no information documented on the homunculus. Go to the Rheumatology activity and complete the homunculus joint exam.  Investigation: No additional findings.  Imaging: No results found.  Recent Labs: Lab Results  Component Value Date   WBC 5.1 01/09/2022   HGB 13.3 01/09/2022  PLT 237.0 01/09/2022   NA 137 03/16/2022   K 3.8 03/16/2022   CL 103 03/16/2022   CO2 28 03/16/2022   GLUCOSE 85 03/16/2022   BUN 13 03/16/2022   CREATININE 0.74 03/16/2022   BILITOT 0.7 03/16/2022   ALKPHOS 36 (L) 03/16/2022   AST 25 03/16/2022   ALT 14 03/16/2022   PROT 6.9 03/16/2022   ALBUMIN 4.4 03/16/2022   CALCIUM 9.1 03/16/2022   GFRAA 98 04/04/2018    Speciality Comments: No specialty comments available.  Procedures:  No  procedures performed Allergies: Patient has no known allergies.   Assessment / Plan:     Visit Diagnoses: No diagnosis found.  Orders: No orders of the defined types were placed in this encounter.  No orders of the defined types were placed in this encounter.   Face-to-face time spent with patient was *** minutes. Greater than 50% of time was spent in counseling and coordination of care.  Follow-Up Instructions: No follow-ups on file.   Ellen Henri, CMA  Note - This record has been created using Animal nutritionist.  Chart creation errors have been sought, but may not always  have been located. Such creation errors do not reflect on  the standard of medical care.

## 2022-04-13 ENCOUNTER — Ambulatory Visit: Payer: BC Managed Care – PPO | Admitting: Family

## 2022-04-17 ENCOUNTER — Ambulatory Visit: Payer: BC Managed Care – PPO | Admitting: Rheumatology

## 2022-04-17 DIAGNOSIS — M5030 Other cervical disc degeneration, unspecified cervical region: Secondary | ICD-10-CM

## 2022-04-17 DIAGNOSIS — G5701 Lesion of sciatic nerve, right lower limb: Secondary | ICD-10-CM

## 2022-04-17 DIAGNOSIS — G8929 Other chronic pain: Secondary | ICD-10-CM

## 2022-04-17 DIAGNOSIS — M4126 Other idiopathic scoliosis, lumbar region: Secondary | ICD-10-CM

## 2022-04-17 DIAGNOSIS — M3500 Sicca syndrome, unspecified: Secondary | ICD-10-CM

## 2022-04-17 DIAGNOSIS — M818 Other osteoporosis without current pathological fracture: Secondary | ICD-10-CM

## 2022-04-17 DIAGNOSIS — M62838 Other muscle spasm: Secondary | ICD-10-CM

## 2022-04-17 DIAGNOSIS — M7711 Lateral epicondylitis, right elbow: Secondary | ICD-10-CM

## 2022-04-17 DIAGNOSIS — Z5181 Encounter for therapeutic drug level monitoring: Secondary | ICD-10-CM

## 2022-04-22 DIAGNOSIS — L290 Pruritus ani: Secondary | ICD-10-CM | POA: Diagnosis not present

## 2022-05-08 DIAGNOSIS — F4323 Adjustment disorder with mixed anxiety and depressed mood: Secondary | ICD-10-CM | POA: Diagnosis not present

## 2022-05-27 ENCOUNTER — Other Ambulatory Visit: Payer: Self-pay | Admitting: Rheumatology

## 2022-05-27 NOTE — Telephone Encounter (Signed)
Next Visit: 09/11/2022  Last Visit: 03/06/2022  Last Fill: 12/05/2021  DX: Other osteoporosis without current pathological fracture  Current Dose per office note 03/06/2022: Fosamax 70 mg 1 tablet by mouth once weekly   Labs: 01/09/2022 CBC WNL, 03/16/2022 AMP, Alk. Phos 36  Okay to refill Fosamax?

## 2022-06-01 ENCOUNTER — Ambulatory Visit (HOSPITAL_COMMUNITY)
Admission: RE | Admit: 2022-06-01 | Discharge: 2022-06-01 | Disposition: A | Discharge status: Home / Self Care | Payer: BC Managed Care – PPO | Source: Ambulatory Visit | Attending: Family Medicine | Admitting: Family Medicine

## 2022-06-01 ENCOUNTER — Ambulatory Visit (HOSPITAL_COMMUNITY): Payer: BC Managed Care – PPO

## 2022-06-01 DIAGNOSIS — R911 Solitary pulmonary nodule: Secondary | ICD-10-CM | POA: Insufficient documentation

## 2022-06-01 DIAGNOSIS — R918 Other nonspecific abnormal finding of lung field: Secondary | ICD-10-CM | POA: Diagnosis not present

## 2022-06-01 DIAGNOSIS — J984 Other disorders of lung: Secondary | ICD-10-CM | POA: Diagnosis not present

## 2022-06-05 ENCOUNTER — Encounter: Payer: Self-pay | Admitting: Family Medicine

## 2022-06-15 ENCOUNTER — Inpatient Hospital Stay
Admission: RE | Admit: 2022-06-15 | Discharge: 2022-06-15 | Disposition: A | Discharge status: Home / Self Care | Payer: Self-pay | Source: Ambulatory Visit | Attending: Family Medicine | Admitting: Family Medicine

## 2022-06-15 ENCOUNTER — Other Ambulatory Visit (HOSPITAL_COMMUNITY): Payer: Self-pay | Admitting: Family Medicine

## 2022-06-15 DIAGNOSIS — R911 Solitary pulmonary nodule: Secondary | ICD-10-CM

## 2022-07-20 DIAGNOSIS — F4323 Adjustment disorder with mixed anxiety and depressed mood: Secondary | ICD-10-CM | POA: Diagnosis not present

## 2022-07-20 DIAGNOSIS — Z630 Problems in relationship with spouse or partner: Secondary | ICD-10-CM | POA: Diagnosis not present

## 2022-08-07 ENCOUNTER — Ambulatory Visit: Payer: BC Managed Care – PPO | Admitting: Family Medicine

## 2022-08-07 ENCOUNTER — Encounter: Payer: Self-pay | Admitting: Family Medicine

## 2022-08-07 VITALS — BP 130/80 | HR 62 | Temp 97.8°F | Ht 64.0 in | Wt 116.2 lb

## 2022-08-07 DIAGNOSIS — R9389 Abnormal findings on diagnostic imaging of other specified body structures: Secondary | ICD-10-CM | POA: Diagnosis not present

## 2022-08-07 DIAGNOSIS — R911 Solitary pulmonary nodule: Secondary | ICD-10-CM | POA: Diagnosis not present

## 2022-08-07 DIAGNOSIS — R0789 Other chest pain: Secondary | ICD-10-CM | POA: Diagnosis not present

## 2022-08-07 DIAGNOSIS — Z801 Family history of malignant neoplasm of trachea, bronchus and lung: Secondary | ICD-10-CM

## 2022-08-07 NOTE — Addendum Note (Signed)
Addended by: Asencion Partridge on: 08/07/2022 09:07 AM   Modules accepted: Orders

## 2022-08-07 NOTE — Progress Notes (Signed)
Subjective  CC:   Chief Complaint  Patient presents with   CT results    Pt would like to discuss CT results          HPI: Christine Ford is a 61 y.o. female who presents to the office today to address the problems listed above in the chief complaint. 61 year old with history of passive smoke exposure and strong family history of lung cancer and COPD presents today abnormal chest CT findings.  See below.  She has had an abnormal chest x-ray dating back to 2012 by review of records.  Had some apical blebs and scarring at that time.  Recent chest CT shows pleural thickening and multiple pulmonary nodules.  She is here to discuss these results.  Overall she is healthy without pulmonology disease.  However she feels that her lungs have not felt right for about 6 months.  Her symptoms are vague but she reports when pressed today at it may be hard to take a deep breath then.  She denies dyspnea exertion, shortness of breath, productive cough, fevers, chills, decreased stamina.  She does admit to mild occasional cough with postnasal drainage.  She has no GI symptoms. Stress: Under a lot of stress due to home life.  Feels she is coping well but does report a few episodes of atypical chest pain described as momentary sharp left-sided pain.  Lasted about 30 seconds.  No associated symptoms.  No exertional symptoms.  She does yoga for stress reduction.  She and her husband are seeing a Database administrator.  She works full-time and is busy with 3 young adult boys.  She has a strong family history of heart disease.  She has had a negative cardiac workup in the past including echocardiogram and cardiac CT score.  Assessment  1. Abnormal chest CT   2. Pulmonary nodule   3. Family history of lung cancer   4. Atypical chest pain      Plan  Chest CT abnormal: Patient is very concerned given her family history of lung cancer and COPD.  I believe many of these findings are chronic.  Recommend further workup with  PFTs and repeat chest CT.  Patient would like to see a pulmonologist for further discussion.  Referral placed today. Atypical chest pain likely due to stress reaction: Counseling done.  Patient will monitor symptoms. Pulmonary nodules.  May possibly warrant repeat chest CT.  Follow up: March for complete physical Visit date not found  No orders of the defined types were placed in this encounter.  No orders of the defined types were placed in this encounter.     I reviewed the patients updated PMH, FH, and SocHx.    Patient Active Problem List   Diagnosis Date Noted   Spondylosis without myelopathy or radiculopathy, lumbar region 05/20/2021    Priority: High   DDD (degenerative disc disease), cervical 05/06/2018    Priority: High   Osteoporosis 07/28/2017    Priority: High   Primary insomnia 09/26/2008    Priority: High   GAD (generalized anxiety disorder) 09/28/2007    Priority: High   Sicca syndrome (HCC) 01/27/2021    Priority: Medium    Chronic left shoulder pain 05/06/2018    Priority: Medium    Family history of premature CAD 07/28/2017    Priority: Medium    Other idiopathic scoliosis, lumbar region 09/30/2018    Priority: Low   History of palpitations 07/29/2017    Priority: Low   TMJ arthritis  09/28/2007    Priority: Low   Pulmonary nodule 01/09/2022   Myofascial pain syndrome 05/20/2021   Chronic eczematoid otitis externa of both ears 11/01/2020   Current Meds  Medication Sig   alendronate (FOSAMAX) 70 MG tablet TAKE 1 TABLET(70 MG) BY MOUTH 1 TIME A WEEK WITH A FULL GLASS OF WATER AND ON AN EMPTY STOMACH   Cholecalciferol (VITAMIN D3) 1000 units CAPS    escitalopram (LEXAPRO) 10 MG tablet TAKE 1 TABLET(10 MG) BY MOUTH DAILY   Multiple Vitamins-Minerals (MULTIVITAMIN,TX-MINERALS) tablet Take 1 tablet by mouth daily.    Allergies: Patient has No Known Allergies. Family History: Patient family history includes Alzheimer's disease in her mother; Breast  cancer in her cousin and another family member; COPD in her mother; Coronary artery disease (age of onset: 95) in her mother; Glaucoma in her son; Healthy in her son and son; Heart attack (age of onset: 46) in her father; Heart disease in her father and sister; Hypertension in her father and mother; Lung cancer in her cousin, maternal aunt, and another family member; Osteoporosis in her mother. Social History:  Patient  reports that she has never smoked. She has never used smokeless tobacco. She reports current alcohol use. She reports that she does not use drugs.  Review of Systems: Constitutional: Negative for fever malaise or anorexia Cardiovascular: negative for chest pain Respiratory: negative for SOB or persistent cough Gastrointestinal: negative for abdominal pain  Objective  Vitals: BP 130/80   Pulse 62   Temp 97.8 F (36.6 C)   Ht 5\' 4"  (1.626 m)   Wt 116 lb 3.2 oz (52.7 kg)   SpO2 99%   BMI 19.95 kg/m  General: no acute distress , A&Ox3 HEENT: PEERL, conjunctiva normal, neck is supple Cardiovascular:  RRR without murmur or gallop.  Respiratory:  Good breath sounds bilaterally, CTAB with normal respiratory effort Skin:  Warm, no rashes Chest CT results 06/03/2022 IMPRESSION: 1. Multiple bilateral pulmonary nodules measuring less than 4 mm. No follow-up needed if patient is low-risk (and has no known or suspected primary neoplasm). Non-contrast chest CT can be considered in 12 months if patient is high-risk. This recommendation follows the consensus statement: Guidelines for Management of Incidental Pulmonary Nodules Detected on CT Images: From the Fleischner Society 2017; Radiology 2017; 284:228-243. 2. Irregular pleural thickening along the right major fissure and minor fissure. Likely sequelae of prior infection/inflammation. Follow-up CT chest in 3 months could be used to ensure stability. Commons side effects, risks, benefits, and alternatives for medications and  treatment plan prescribed today were discussed, and the patient expressed understanding of the given instructions. Patient is instructed to call or message via MyChart if he/she has any questions or concerns regarding our treatment plan. No barriers to understanding were identified. We discussed Red Flag symptoms and signs in detail. Patient expressed understanding regarding what to do in case of urgent or emergency type symptoms.  Medication list was reconciled, printed and provided to the patient in AVS. Patient instructions and summary information was reviewed with the patient as documented in the AVS. This note was prepared with assistance of Dragon voice recognition software. Occasional wrong-word or sound-a-like substitutions may have occurred due to the inherent limitations of voice recognition software  This visit occurred during the SARS-CoV-2 public health emergency.  Safety protocols were in place, including screening questions prior to the visit, additional usage of staff PPE, and extensive cleaning of exam room while observing appropriate contact time as indicated for disinfecting solutions.

## 2022-08-28 NOTE — Progress Notes (Deleted)
Office Visit Note  Patient: Christine Ford             Date of Birth: 1961/08/28           MRN: [id number]             PCP: Willow Ora, MD Referring: Willow Ora, MD Visit Date: 09/11/2022 Occupation: @GUAROCC @  Subjective:  No chief complaint on file.   History of Present Illness: Christine Ford is a 61 y.o. female ***   Activities of Daily Living:  Patient reports morning stiffness for *** {minute/hour:19697}.   Patient {ACTIONS;DENIES/REPORTS:21021675::"Denies"} nocturnal pain.  Difficulty dressing/grooming: {ACTIONS;DENIES/REPORTS:21021675::"Denies"} Difficulty climbing stairs: {ACTIONS;DENIES/REPORTS:21021675::"Denies"} Difficulty getting out of chair: {ACTIONS;DENIES/REPORTS:21021675::"Denies"} Difficulty using hands for taps, buttons, cutlery, and/or writing: {ACTIONS;DENIES/REPORTS:21021675::"Denies"}  No Rheumatology ROS completed.   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 11/01/2020   Other idiopathic scoliosis, lumbar region 09/30/2018   DDD (degenerative disc disease), cervical 05/06/2018   Chronic left shoulder pain 05/06/2018   History of palpitations 07/29/2017   Osteoporosis 07/28/2017   Family history of premature CAD 07/28/2017   Primary insomnia 09/26/2008   GAD (generalized anxiety disorder) 09/28/2007   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    Past Medical History:  Diagnosis Date   Anxiety    Arthritis    Family history of dementia, mother, age > 21 07/28/2017   Osteopenia    Temporomandibular joint disorder (TMJ)     Family History  Problem Relation Age of Onset   Coronary artery disease Mother 56   Hypertension Mother    Osteoporosis Mother    COPD Mother    Alzheimer's disease Mother     Hypertension Father    Heart attack Father 33   Heart disease Father    Glaucoma Son    Healthy Son    Breast cancer Other    Lung cancer Other    Lung cancer Maternal Aunt    Heart disease Sister    Healthy Son    Lung cancer Cousin    Breast cancer Cousin    Past Surgical History:  Procedure Laterality Date   BREAST SURGERY     dental implant  2022   WISDOM TOOTH EXTRACTION     Social History   Social History Narrative   Occupation: 2023, 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  3 sons   Immunization History  Administered Date(s) Administered   Influenza,inj,Quad PF,6+ Mos 11/22/2018, 09/15/2019   Influenza-Unspecified 09/12/2020   PFIZER(Purple Top)SARS-COV-2 Vaccination 03/02/2020, 03/23/2020   PPD Test 10/22/2014, 11/05/2014   Td 12/03/2006   Tdap 12/19/2016   Zoster Recombinat (Shingrix) 03/07/2021, 08/07/2021     Objective: Vital Signs: There were no vitals taken for this visit.   Physical Exam   Musculoskeletal Exam: ***  CDAI Exam: CDAI Score: -- Patient Global: --; Provider Global: -- Swollen: --; Tender: -- Joint Exam 09/11/2022   No joint exam has been documented for this visit   There is currently no information documented on the homunculus. Go to the Rheumatology activity and complete the homunculus joint exam.  Investigation: No additional findings.  Imaging: No results found.  Recent Labs: Lab Results  Component Value Date   WBC 5.1 01/09/2022   HGB 13.3 01/09/2022  PLT 237.0 01/09/2022   NA 137 03/16/2022   K 3.8 03/16/2022   CL 103 03/16/2022   CO2 28 03/16/2022   GLUCOSE 85 03/16/2022   BUN 13 03/16/2022   CREATININE 0.74 03/16/2022   BILITOT 0.7 03/16/2022   ALKPHOS 36 (L) 03/16/2022   AST 25 03/16/2022   ALT 14 03/16/2022   PROT 6.9 03/16/2022   ALBUMIN 4.4 03/16/2022   CALCIUM 9.1 03/16/2022   GFRAA 98 04/04/2018    Speciality Comments: No specialty comments available.  Procedures:  No  procedures performed Allergies: Patient has no known allergies.   Assessment / Plan:     Visit Diagnoses: Other osteoporosis without current pathological fracture  Medication monitoring encounter  DDD (degenerative disc disease), cervical  Other idiopathic scoliosis, lumbar region  Lateral epicondylitis, right elbow  Piriformis syndrome of right side  Trapezius muscle spasm  Sicca syndrome (HCC)  Orders: No orders of the defined types were placed in this encounter.  No orders of the defined types were placed in this encounter.   Face-to-face time spent with patient was *** minutes. Greater than 50% of time was spent in counseling and coordination of care.  Follow-Up Instructions: No follow-ups on file.   Gearldine Bienenstock, PA-C  Note - This record has been created using Dragon software.  Chart creation errors have been sought, but may not always  have been located. Such creation errors do not reflect on  the standard of medical care.

## 2022-09-04 DIAGNOSIS — H6063 Unspecified chronic otitis externa, bilateral: Secondary | ICD-10-CM | POA: Diagnosis not present

## 2022-09-04 DIAGNOSIS — H608X3 Other otitis externa, bilateral: Secondary | ICD-10-CM | POA: Diagnosis not present

## 2022-09-09 NOTE — Progress Notes (Signed)
Office Visit Note  Patient: Christine Ford             Date of Birth: Dec 25, 1961           MRN: [id number]             PCP: Willow Ora, MD Referring: Willow Ora, MD Visit Date: 09/11/2022 Occupation: @GUAROCC @  Subjective:  Medication management  History of Present Illness: Karalyn Kadel is a 61 y.o. female with history of osteoporosis and degenerative disc disease.  Patient states that she has been taking Fosamax 70 mg once a week without any side effects.  She has been also taking calcium and vitamin D.  She continues to have some stiffness in her neck.  She had good response to trapezius trigger point injections.  She intermittently has SI joint pain.  She does a stretching exercises.  She continues to have dry mouth and dry eyes.  Activities of Daily Living:  Patient reports morning stiffness for a few minutes.   Patient Reports nocturnal pain.  Difficulty dressing/grooming: Denies Difficulty climbing stairs: Denies Difficulty getting out of chair: Denies Difficulty using hands for taps, buttons, cutlery, and/or writing: Denies  Review of Systems  Constitutional:  Negative for fatigue.  HENT:  Positive for mouth dryness. Negative for mouth sores.   Eyes:  Positive for dryness.  Respiratory:  Negative for shortness of breath.   Cardiovascular:  Negative for chest pain and palpitations.  Gastrointestinal:  Negative for blood in stool, constipation and diarrhea.  Endocrine: Negative for increased urination.  Genitourinary:  Negative for involuntary urination.  Musculoskeletal:  Positive for morning stiffness. Negative for joint pain, gait problem, joint pain, joint swelling, myalgias, muscle weakness, muscle tenderness and myalgias.  Skin:  Negative for color change, rash, hair loss and sensitivity to sunlight.  Allergic/Immunologic: Negative for susceptible to infections.  Neurological:  Negative for dizziness and headaches.  Hematological:  Negative for swollen  glands.  Psychiatric/Behavioral:  Negative for depressed mood and sleep disturbance. The patient is not nervous/anxious.     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 11/01/2020   Other idiopathic scoliosis, lumbar region 09/30/2018   DDD (degenerative disc disease), cervical 05/06/2018   Chronic left shoulder pain 05/06/2018   History of palpitations 07/29/2017   Osteoporosis 07/28/2017   Family history of premature CAD 07/28/2017   Primary insomnia 09/26/2008   GAD (generalized anxiety disorder) 09/28/2007   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    Past Medical History:  Diagnosis Date   Anxiety    Arthritis    Family history of dementia, mother, age > 72 07/28/2017   Osteopenia    Temporomandibular joint disorder (TMJ)     Family History  Problem Relation Age of Onset   Coronary artery disease Mother 45   Hypertension Mother    Osteoporosis Mother    COPD Mother    Alzheimer's disease Mother    Hypertension Father    Heart attack Father 42   Heart disease Father    Glaucoma Son    Healthy Son    Breast cancer Other    Lung cancer Other    Lung cancer Maternal Aunt    Heart disease Sister    Healthy Son    Lung cancer Cousin  Breast cancer Cousin    Past Surgical History:  Procedure Laterality Date   BREAST SURGERY     dental implant  2022   WISDOM TOOTH EXTRACTION     Social History   Social History Narrative   Occupation: Armed forces operational officer, 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  3 sons   Immunization History  Administered Date(s) Administered   Influenza,inj,Quad PF,6+ Mos 11/22/2018, 09/15/2019   Influenza-Unspecified 09/12/2020   PFIZER(Purple Top)SARS-COV-2 Vaccination 03/02/2020, 03/23/2020   PPD  Test 10/22/2014, 11/05/2014   Td 12/03/2006   Tdap 12/19/2016   Zoster Recombinat (Shingrix) 03/07/2021, 08/07/2021     Objective: Vital Signs: BP 136/78 (BP Location: Left Arm, Patient Position: Sitting, Cuff Size: Normal)   Pulse (!) 55   Resp 14   Ht 5\' 4"  (1.626 m)   Wt 116 lb 3.2 oz (52.7 kg)   BMI 19.95 kg/m    Physical Exam Vitals and nursing note reviewed.  Constitutional:      Appearance: She is well-developed.  HENT:     Head: Normocephalic and atraumatic.  Eyes:     Conjunctiva/sclera: Conjunctivae normal.  Cardiovascular:     Rate and Rhythm: Normal rate and regular rhythm.     Heart sounds: Normal heart sounds.  Pulmonary:     Effort: Pulmonary effort is normal.     Breath sounds: Normal breath sounds.  Abdominal:     General: Bowel sounds are normal.     Palpations: Abdomen is soft.  Musculoskeletal:     Cervical back: Normal range of motion.  Lymphadenopathy:     Cervical: No cervical adenopathy.  Skin:    General: Skin is warm and dry.     Capillary Refill: Capillary refill takes less than 2 seconds.  Neurological:     Mental Status: She is alert and oriented to person, place, and time.  Psychiatric:        Behavior: Behavior normal.      Musculoskeletal Exam: She had limited range of motion of the cervical spine.  Shoulder joints, elbow joints, wrist joints, MCPs PIPs and DIPs with good range of motion.  She had bilateral PIP and DIP thickening consistent with osteoarthritis.  No synovitis was noted.  Hip joints and knee joints were in good range of motion.  She had thickening of bilateral first MTP joints more prominent on the right side.  CDAI Exam: CDAI Score: -- Patient Global: --; Provider Global: -- Swollen: 0 ; Tender: 0  Joint Exam 09/11/2022   No joint exam has been documented for this visit   There is currently no information documented on the homunculus. Go to the Rheumatology activity and complete the homunculus joint  exam.  Investigation: No additional findings.  Imaging: No results found.  Recent Labs: Lab Results  Component Value Date   WBC 5.1 01/09/2022   HGB 13.3 01/09/2022   PLT 237.0 01/09/2022   NA 137 03/16/2022   K 3.8 03/16/2022   CL 103 03/16/2022   CO2 28 03/16/2022   GLUCOSE 85 03/16/2022   BUN 13 03/16/2022   CREATININE 0.74 03/16/2022   BILITOT 0.7 03/16/2022   ALKPHOS 36 (L) 03/16/2022   AST 25 03/16/2022   ALT 14 03/16/2022   PROT 6.9 03/16/2022   ALBUMIN 4.4 03/16/2022   CALCIUM 9.1 03/16/2022   GFRAA 98 04/04/2018    Speciality Comments: Fosamax started December 2022   Procedures:  No procedures performed Allergies: Patient has no known allergies.   Assessment /  Plan:     Visit Diagnoses: Other osteoporosis without current pathological fracture - DEXA 03/14/2021: T-score left femoral neck -2.6, -0.6% change from previous BMD. AP spine T-score -2.2.  -3.5% decrease in BMD of spine.  Patient has been taking Fosamax 70 mg p.o. weekly without any side effects.  She is also on calcium and vitamin D.  She will get DEXA scan next year and April.  We will obtain labs in March.  Medication monitoring encounter - Fosamax 70 mg p.o. weekly.  CMP was normal in March 2023.  Sicca syndrome (HCC) - Positive ANA, negative Ro, negative Lyme, negative RF.  History of Sjogren's in her mother.  She continues to have dry mouth and dry eyes.  Over-the-counter products were discussed at length.  Primary osteoarthritis of both hands-she had bilateral PIP and DIP thickening.  Joint protection muscle strengthening was discussed.  Primary osteoarthritis of both feet-she also had bilateral first MTP thickening more prominent on the right side.  Proper fitting shoes with arch support were advised.  Trapezius muscle spasm - She had trigger point injections in October 2022.  She had good response to trigger point injections.  DDD (degenerative disc disease), cervical -she continues to have  neck a stiffness and had limited lateral rotation.  C-spine exercises were demonstrated in the office.  MRI of the C-spine on 04/19/18, which revealed multilevel degenerative disc disease and facet arthorsis.  C7-T1 right-sided foraminal narrowing was noted.  Other idiopathic scoliosis, lumbar region  Chronic right SI joint pain - Followed at [facility].  She has been doing stretching exercises and still has some discomfort in the SI joints.  Orders: No orders of the defined types were placed in this encounter.  No orders of the defined types were placed in this encounter.    Follow-Up Instructions: Return in about 6 months (around 03/12/2023) for Osteoporosis, Osteoarthritis.   Pollyann Savoy, MD  Note - This record has been created using Animal nutritionist.  Chart creation errors have been sought, but may not always  have been located. Such creation errors do not reflect on  the standard of medical care.

## 2022-09-11 ENCOUNTER — Ambulatory Visit: Payer: BC Managed Care – PPO | Admitting: Rheumatology

## 2022-09-11 ENCOUNTER — Encounter: Payer: Self-pay | Admitting: Rheumatology

## 2022-09-11 ENCOUNTER — Ambulatory Visit: Payer: BC Managed Care – PPO | Attending: Rheumatology | Admitting: Rheumatology

## 2022-09-11 VITALS — BP 136/78 | HR 55 | Resp 14 | Ht 64.0 in | Wt 116.2 lb

## 2022-09-11 DIAGNOSIS — M533 Sacrococcygeal disorders, not elsewhere classified: Secondary | ICD-10-CM

## 2022-09-11 DIAGNOSIS — M818 Other osteoporosis without current pathological fracture: Secondary | ICD-10-CM | POA: Diagnosis not present

## 2022-09-11 DIAGNOSIS — M19072 Primary osteoarthritis, left ankle and foot: Secondary | ICD-10-CM

## 2022-09-11 DIAGNOSIS — M3500 Sicca syndrome, unspecified: Secondary | ICD-10-CM | POA: Diagnosis not present

## 2022-09-11 DIAGNOSIS — Z5181 Encounter for therapeutic drug level monitoring: Secondary | ICD-10-CM

## 2022-09-11 DIAGNOSIS — G5701 Lesion of sciatic nerve, right lower limb: Secondary | ICD-10-CM

## 2022-09-11 DIAGNOSIS — M19042 Primary osteoarthritis, left hand: Secondary | ICD-10-CM

## 2022-09-11 DIAGNOSIS — M19041 Primary osteoarthritis, right hand: Secondary | ICD-10-CM

## 2022-09-11 DIAGNOSIS — M4126 Other idiopathic scoliosis, lumbar region: Secondary | ICD-10-CM

## 2022-09-11 DIAGNOSIS — M5030 Other cervical disc degeneration, unspecified cervical region: Secondary | ICD-10-CM

## 2022-09-11 DIAGNOSIS — M7711 Lateral epicondylitis, right elbow: Secondary | ICD-10-CM

## 2022-09-11 DIAGNOSIS — M62838 Other muscle spasm: Secondary | ICD-10-CM

## 2022-09-11 DIAGNOSIS — M19071 Primary osteoarthritis, right ankle and foot: Secondary | ICD-10-CM

## 2022-09-11 DIAGNOSIS — G8929 Other chronic pain: Secondary | ICD-10-CM

## 2022-10-09 ENCOUNTER — Encounter: Payer: Self-pay | Admitting: Emergency Medicine

## 2022-10-09 ENCOUNTER — Ambulatory Visit: Payer: BC Managed Care – PPO | Admitting: Emergency Medicine

## 2022-10-09 DIAGNOSIS — R911 Solitary pulmonary nodule: Secondary | ICD-10-CM | POA: Diagnosis not present

## 2022-10-09 DIAGNOSIS — R9389 Abnormal findings on diagnostic imaging of other specified body structures: Secondary | ICD-10-CM

## 2022-10-09 NOTE — Progress Notes (Signed)
Subjective:    Patient ID: Christine Ford, female    DOB: [birth year], 61 y.o.   MRN: [id number]  HPI 60 year old woman, never smoker but with a history of secondhand smoke exposure.  She has a history of depression/anxiety, osteopenia.  She is here today to discuss CT scan of the chest. She is doing well. No dyspnea. She has some cough at night - a tickle. Occasionally has some dysphagia, choking - rare. No hx auto-immune disease.   Cardiac calcium scoring CT chest 09/22/2019 reviewed by me showed no suspicious nodules or masses, no consolidation or concerning findings.  CT chest 06/01/2022 to follow-up pulmonary nodule disease reviewed by me showed biapical pleural-parenchymal scarring.  Small right upper lobe, left upper lobe nodules 2-3 mm without any other significant findings. She apparently had another CT done 12/2018 that may have shown a 46mm nodule. I do not have access to that scan   Review of Systems As per HPI  Past Medical History:  Diagnosis Date   Anxiety    Arthritis    Family history of dementia, mother, age > 89 07/28/2017   Osteopenia    Temporomandibular joint disorder (TMJ)      Family History  Problem Relation Age of Onset   Coronary artery disease Mother 41   Hypertension Mother    Osteoporosis Mother    COPD Mother    Alzheimer's disease Mother    Hypertension Father    Heart attack Father 64   Heart disease Father    Glaucoma Son    Healthy Son    Breast cancer Other    Lung cancer Other    Lung cancer Maternal Aunt    Heart disease Sister    Healthy Son    Lung cancer Cousin    Breast cancer Cousin     Mom lung CA  Social History   Socioeconomic History   Marital status: Married    Spouse name: Not on file   Number of children: 3   Years of education: Not on file   Highest education level: Not on file  Occupational History   Occupation: dental hygenist    Comment: [employer]  Tobacco Use   Smoking status: Never    Passive  exposure: Past   Smokeless tobacco: Never   Tobacco comments:    Passive smoke exposure   Vaping Use   Vaping Use: Never used  Substance and Sexual Activity   Alcohol use: Yes    Comment: socially   Drug use: No   Sexual activity: Yes    Partners: Male    Birth control/protection: Post-menopausal  Other Topics Concern   Not on file  Social History Narrative   Occupation: Armed forces operational officer, 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  3 sons   Social Determinants of Corporate investment banker Strain: Not on file  Food Insecurity: Not on file  Transportation Needs: Not on file  Physical Activity: Not on file  Stress: Not on file  Social Connections: Not on file  Intimate Partner Violence: Not on file    Second hand smoke exposure Has lived  Dental hygienist    No Known Allergies   Outpatient Medications Prior to Visit  Medication Sig Dispense Refill   alendronate (FOSAMAX) 70 MG tablet TAKE 1 TABLET(70 MG) BY MOUTH 1 TIME A WEEK WITH A FULL GLASS OF WATER AND ON AN EMPTY STOMACH 4 tablet 5   Cholecalciferol (VITAMIN D3) 1000 units CAPS  Multiple Vitamins-Minerals (MULTIVITAMIN,TX-MINERALS) tablet Take 1 tablet by mouth daily.     escitalopram (LEXAPRO) 10 MG tablet TAKE 1 TABLET(10 MG) BY MOUTH DAILY (Patient not taking: Reported on 09/11/2022) 90 tablet 3   No facility-administered medications prior to visit.        Objective:   Physical Exam Vitals:   10/09/22 0937  BP: 122/76  Pulse: 60  Temp: 98.7 F (37.1 C)  TempSrc: Oral  SpO2: 98%  Weight: 118 lb 6.4 oz (53.7 kg)  Height: 5\' 4"  (1.626 m)   Gen: Pleasant, well-nourished, in no distress,  normal affect  ENT: No lesions,  mouth clear,  oropharynx clear, no postnasal drip  Neck: No JVD, no stridor  Lungs: No use of accessory muscles, no crackles or wheezing on normal respiration, no wheeze on forced expiration  Cardiovascular: RRR, heart sounds normal, no murmur or gallops, no peripheral  edema  Musculoskeletal: No deformities, no cyanosis or clubbing  Neuro: alert, awake, non focal  Skin: Warm, no lesions or rash      Assessment & Plan:   Pulmonary nodule CT chest 06/01/2022 was reviewed.  She has biapical pleural scarring as below.  There are small scattered pulmonary nodules 2-3 mm.  I do not see a 6 mm nodule.  Given her never smoking status and stability over 3 years she should not need any further follow-up scans to evaluate the nodules.  Reassured her about this.  Abnormal CT of the chest Biapical pleural-parenchymal scarring with some right upper lobe linear scar.  Etiology unclear.  She does not have a history of autoimmune disease but she does have a family history of the same as well as ILD and her mother.  I do not see any evidence of active inflammation or groundglass.  We will plan to perform a high-resolution CT scan at the 1 year mark which would be in June 2024.  If any evidence of progression of scar or evolving ILD then she will need a full work-up, autoimmune labs, possible referral to our ILD clinic.  We will review her scan and decide whether any work-up is needed.  Baltazar Apo, MD, PhD 10/09/2022, 10:14 AM [facility] [phone] or if no answer before 7:00PM call [phone] For any issues after 7:00PM please call eLink [phone]

## 2022-10-09 NOTE — Patient Instructions (Signed)
We will perform a high resolution CT scan of the chest in June 2024 to follow for any changes in interstitial scarring. Two small pulmonary nodules noted on your CT scan from June 2023 can be felt to be benign.  They do not need any dedicated follow-up. Follow with Dr. Lamonte Sakai in June 2024 after your CT scan so we can review the results together.

## 2022-10-09 NOTE — Progress Notes (Signed)
118.4 lbs

## 2022-10-09 NOTE — Assessment & Plan Note (Signed)
CT chest 06/01/2022 was reviewed.  She has biapical pleural scarring as below.  There are small scattered pulmonary nodules 2-3 mm.  I do not see a 6 mm nodule.  Given her never smoking status and stability over 3 years she should not need any further follow-up scans to evaluate the nodules.  Reassured her about this.

## 2022-10-09 NOTE — Assessment & Plan Note (Signed)
Biapical pleural-parenchymal scarring with some right upper lobe linear scar.  Etiology unclear.  She does not have a history of autoimmune disease but she does have a family history of the same as well as ILD and her mother.  I do not see any evidence of active inflammation or groundglass.  We will plan to perform a high-resolution CT scan at the 1 year mark which would be in June 2024.  If any evidence of progression of scar or evolving ILD then she will need a full work-up, autoimmune labs, possible referral to our ILD clinic.  We will review her scan and decide whether any work-up is needed.

## 2022-11-18 ENCOUNTER — Other Ambulatory Visit: Payer: Self-pay | Admitting: Rheumatology

## 2022-11-18 NOTE — Telephone Encounter (Signed)
Next Visit: 03/15/2023  Last Visit: 09/11/2022  Last Fill: 05/27/2022  DX: Other osteoporosis without current pathological fracture   Current Dose per office note 09/11/2022: Fosamax 70 mg p.o. weekly.   Labs: 03/16/2022 Alk. Phos 36  Okay to refill Fosamax?

## 2022-11-23 ENCOUNTER — Telehealth: Payer: Self-pay | Admitting: Rheumatology

## 2022-11-23 ENCOUNTER — Other Ambulatory Visit: Payer: Self-pay | Admitting: *Deleted

## 2022-11-23 DIAGNOSIS — M818 Other osteoporosis without current pathological fracture: Secondary | ICD-10-CM

## 2022-11-23 DIAGNOSIS — Z5181 Encounter for therapeutic drug level monitoring: Secondary | ICD-10-CM | POA: Diagnosis not present

## 2022-11-23 NOTE — Telephone Encounter (Signed)
I called patient, patient verbalized understanding. 

## 2022-11-23 NOTE — Telephone Encounter (Signed)
Patient requested a return call to let her know if she needs to continue taking Fosamax and if she is due for labwork.

## 2022-11-23 NOTE — Telephone Encounter (Signed)
She should get CMP with GFR and vitamin D now.  She should continue Fosamax.  DEXA scan is due next year.  We will make further decisions after the DEXA results.

## 2022-11-24 LAB — COMPLETE METABOLIC PANEL WITH GFR
AG Ratio: 1.7 (calc) (ref 1.0–2.5)
ALT: 12 U/L (ref 6–29)
AST: 21 U/L (ref 10–35)
Albumin: 4.3 g/dL (ref 3.6–5.1)
Alkaline phosphatase (APISO): 32 U/L — ABNORMAL LOW (ref 37–153)
BUN: 17 mg/dL (ref 7–25)
CO2: 30 mmol/L (ref 20–32)
Calcium: 9.7 mg/dL (ref 8.6–10.4)
Chloride: 104 mmol/L (ref 98–110)
Creat: 0.93 mg/dL (ref 0.50–1.05)
Globulin: 2.6 g/dL (calc) (ref 1.9–3.7)
Glucose, Bld: 80 mg/dL (ref 65–99)
Potassium: 3.8 mmol/L (ref 3.5–5.3)
Sodium: 142 mmol/L (ref 135–146)
Total Bilirubin: 0.6 mg/dL (ref 0.2–1.2)
Total Protein: 6.9 g/dL (ref 6.1–8.1)
eGFR: 70 mL/min/{1.73_m2} (ref >=60)

## 2022-11-24 LAB — VITAMIN D 25 HYDROXY (VIT D DEFICIENCY, FRACTURES): Vit D, 25-Hydroxy: 45 ng/mL (ref 30–100)

## 2022-11-24 NOTE — Progress Notes (Signed)
Vitamin D WNL.  Alk phos is borderline low. Rest of CMP WNL.

## 2023-01-18 ENCOUNTER — Ambulatory Visit: Payer: BC Managed Care – PPO | Admitting: Family Medicine

## 2023-01-18 ENCOUNTER — Encounter: Payer: Self-pay | Admitting: Family Medicine

## 2023-01-18 VITALS — BP 120/70 | HR 61 | Temp 97.9°F | Ht 64.0 in | Wt 117.4 lb

## 2023-01-18 DIAGNOSIS — F411 Generalized anxiety disorder: Secondary | ICD-10-CM | POA: Diagnosis not present

## 2023-01-18 DIAGNOSIS — R9389 Abnormal findings on diagnostic imaging of other specified body structures: Secondary | ICD-10-CM | POA: Diagnosis not present

## 2023-01-18 DIAGNOSIS — R4189 Other symptoms and signs involving cognitive functions and awareness: Secondary | ICD-10-CM | POA: Diagnosis not present

## 2023-01-18 DIAGNOSIS — R419 Unspecified symptoms and signs involving cognitive functions and awareness: Secondary | ICD-10-CM | POA: Diagnosis not present

## 2023-01-18 DIAGNOSIS — F341 Dysthymic disorder: Secondary | ICD-10-CM | POA: Diagnosis not present

## 2023-01-18 DIAGNOSIS — F5101 Primary insomnia: Secondary | ICD-10-CM

## 2023-01-18 LAB — CBC WITH DIFFERENTIAL/PLATELET
Basophils Absolute: 0 10*3/uL (ref 0.0–0.1)
Basophils Relative: 0.9 % (ref 0.0–3.0)
Eosinophils Absolute: 0.1 10*3/uL (ref 0.0–0.7)
Eosinophils Relative: 1.3 % (ref 0.0–5.0)
HCT: 39.5 % (ref 36.0–46.0)
Hemoglobin: 13.5 g/dL (ref 12.0–15.0)
Lymphocytes Relative: 33.1 % (ref 12.0–46.0)
Lymphs Abs: 1.8 10*3/uL (ref 0.7–4.0)
MCHC: 34.1 g/dL (ref 30.0–36.0)
MCV: 94.8 fl (ref 78.0–100.0)
Monocytes Absolute: 0.5 10*3/uL (ref 0.1–1.0)
Monocytes Relative: 8.8 % (ref 3.0–12.0)
Neutro Abs: 3 10*3/uL (ref 1.4–7.7)
Neutrophils Relative %: 55.9 % (ref 43.0–77.0)
Platelets: 234 10*3/uL (ref 150.0–400.0)
RBC: 4.16 Mil/uL (ref 3.87–5.11)
RDW: 13.7 % (ref 11.5–15.5)
WBC: 5.4 10*3/uL (ref 4.0–10.5)

## 2023-01-18 LAB — TSH: TSH: 1.93 u[IU]/mL (ref 0.35–5.50)

## 2023-01-18 LAB — VITAMIN B12: Vitamin B-12: 450 pg/mL (ref 211–911)

## 2023-01-18 NOTE — Patient Instructions (Signed)
Please follow up as scheduled for your next visit with me: 03/19/2023   If you have any questions or concerns, please don't hesitate to send me a message via MyChart or call the office at [phone]. Thank you for visiting with [place] today! It's our pleasure caring for you.   I will release your lab results to you on your MyChart account with further instructions. You may see the results before I do, but when I review them I will send you a message with my report or have my assistant call you if things need to be discussed. Please reply to my message with any questions. Thank you!   We will call you to get you scheduled with neurology.  Cognitive Tips  Keep a journal/notebook with sections for the following (or use sections separately as needed):  Calendar and appointment sheet, schedule for each day, lists of reminders (such as grocery lists or "to do" list), homework assignments for therapy, important information such as family and friends names / addresses / phone numbers, medications, medical history and doctors name / phone numbers.  Avoid / remove clutter and unnecessary items from areas such as countertops / cabinets in kitchen and bathroom, closets, etc.  Organize items by purpose.  Baskets and bins help with this.  Leave notes for reminders above task to be completed.  For example: Note to turn off stove over the the stove; note to lock door beside the door, not to brush teeth then wash face by sink, note to take medication on table etc.  To help recall names of people of people or things, mentally or verbally go through the alphabet to try to determine the 1st letter of the word as this may trigger the name or word you are looking for.  If this is too difficult and someone else knows the word, have them give you the first letter by asking, "does it start with an "A", "B", "C" etc. (or have them give you the first sound of the word or some other clue).  Review family events, occasions,  names, etc.  Pictures are a good way to trigger memory.  Have others correct you if you answer something incorrectly.  Have them speak slowly with a few words to give you time to process and respond.  Don't let others automatically problem-solve. (For example: don't let them automatically lay out clothes in the correct position, but hand it to you folded so that you can figure it out for yourself.)  However, if you need help with tasks, they should give you as little as they can so that you can be successful.  If appropriate and safe, they may allow you to make mistakes so that you can figure out how to correct the error.  (For example, they may allow you to put your shoes on the wrong foot to see if you notice that is wrong).  If you struggle, they should give you a cue.  (Example: "Do your shoes feel right?"  "Do they look right?")

## 2023-01-18 NOTE — Progress Notes (Signed)
Subjective  CC:  Chief Complaint  Patient presents with   Referral    Pt stated that she would like a referral to Neuro bc she is having trouble remembering/recalling things. She feels like its a brain fog going on     HPI: Christine Ford is a 62 y.o. female who presents to the office today to address the problems listed above in the chief complaint. C/o problems with her memory and thinking. For several months worsening, needing to write down everything or she will forget; had trouble computing a tip at a restaurant last week.  Very busy and struggles with stress but feels her memory is a problem and would want medication to help if needed. Was on lexapro but stopped several months back due to gi side effects and didn't feel it was that helpful. Denies depressive sxs. Understands that stress and anxiety can affect memory and function but she feels it has worsened considerably.  Reviewed pulm consult for abnl chest CT.   Assessment  1. Cognitive complaints   2. GAD (generalized anxiety disorder)   3. Primary insomnia   4. Abnormal CT of the chest      Plan  Cognitive concerns:  MMSE 28/30 today (stopped after 3 serial 7s). Check lab work and refer to neuro. May warrant neuropsych eval as well.  GAD: reportedly stable off meds. Monitor.  Insomnia is chronic and unchanged No further f/u for small nodules since stable over 3 years and will recheck scan in 1 year to monitor lung/blebs.  Follow up: for cpe  03/19/2023  Orders Placed This Encounter  Procedures   CBC with Differential/Platelet   Vitamin B12   RPR   TSH   Ambulatory referral to Neurology   No orders of the defined types were placed in this encounter.     I reviewed the patients updated PMH, FH, and SocHx.    Patient Active Problem List   Diagnosis Date Noted   Spondylosis without myelopathy or radiculopathy, lumbar region 05/20/2021    Priority: High   DDD (degenerative disc disease), cervical 05/06/2018     Priority: High   Osteoporosis 07/28/2017    Priority: High   Primary insomnia 09/26/2008    Priority: High   GAD (generalized anxiety disorder) 09/28/2007    Priority: High   Sicca syndrome ([place]) 01/27/2021    Priority: Medium    Chronic left shoulder pain 05/06/2018    Priority: Medium    Family history of premature CAD 07/28/2017    Priority: Medium    Pulmonary nodule 01/09/2022    Priority: Low   Chronic eczematoid otitis externa of both ears 11/01/2020    Priority: Low   Other idiopathic scoliosis, lumbar region 09/30/2018    Priority: Low   History of palpitations 07/29/2017    Priority: Low   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    Priority: Low   Abnormal CT of the chest 10/09/2022   Myofascial pain syndrome 05/20/2021   Current Meds  Medication Sig   alendronate (FOSAMAX) 70 MG tablet TAKE 1 TABLET(70 MG) BY MOUTH 1 TIME A WEEK WITH A FULL GLASS OF WATER AND ON AN EMPTY STOMACH   Cholecalciferol (VITAMIN D3) 1000 units CAPS    Multiple Vitamins-Minerals (MULTIVITAMIN,TX-MINERALS) tablet Take 1 tablet by mouth daily.   [DISCONTINUED] escitalopram (LEXAPRO) 10 MG tablet TAKE 1 TABLET(10 MG) BY MOUTH DAILY    Allergies: Patient has No Known Allergies. Family History: Patient family history includes Alzheimer's disease in her mother; Breast  cancer in her cousin and another family member; COPD in her mother; Coronary artery disease (age of onset: 76) in her mother; Glaucoma in her son; Healthy in her son and son; Heart attack (age of onset: 34) in her father; Heart disease in her father and sister; Hypertension in her father and mother; Lung cancer in her cousin, maternal aunt, and another family member; Osteoporosis in her mother. Social History:  Patient  reports that she has never smoked. She has been exposed to tobacco smoke. She has never used smokeless tobacco. She reports current alcohol use. She reports that she does not use drugs.  Review of Systems: Constitutional:  Negative for fever malaise or anorexia Cardiovascular: negative for chest pain Respiratory: negative for SOB or persistent cough Gastrointestinal: negative for abdominal pain  Objective  Vitals: BP 120/70   Pulse 61   Temp 97.9 F (36.6 C)   Ht 5\' 4"  (1.626 m)   Wt 117 lb 6.4 oz (53.3 kg)   SpO2 98%   BMI 20.15 kg/m  General: no acute distress , A&Ox3   Commons side effects, risks, benefits, and alternatives for medications and treatment plan prescribed today were discussed, and the patient expressed understanding of the given instructions. Patient is instructed to call or message via MyChart if he/she has any questions or concerns regarding our treatment plan. No barriers to understanding were identified. We discussed Red Flag symptoms and signs in detail. Patient expressed understanding regarding what to do in case of urgent or emergency type symptoms.  Medication list was reconciled, printed and provided to the patient in AVS. Patient instructions and summary information was reviewed with the patient as documented in the AVS. This note was prepared with assistance of Dragon voice recognition software. Occasional wrong-word or sound-a-like substitutions may have occurred due to the inherent limitations of voice recognition software

## 2023-01-19 LAB — RPR: RPR Ser Ql: NONREACTIVE

## 2023-03-01 NOTE — Progress Notes (Signed)
Office Visit Note  Patient: Christine Ford             Date of Birth: May 16, 1961           MRN: [id number]             PCP: Leamon Arnt, MD Referring: Leamon Arnt, MD Visit Date: 03/15/2023 Occupation: @GUAROCC @  Subjective:  Neck pain   History of Present Illness: Christine Ford is a 62 y.o. female with history of osteoporosis, osteoarthritis, and DDD.  Patient is taking fosamax 70 mg 1 tablet by mouth once weekly for management of osteoporosis.  Patient reports that she has not missed any doses of Fosamax recently but states that until recently she had been drinking coffee about 15 minutes after taking her Fosamax.  She states that she recently read that anything other than drinking water can cause issues with absorption and is concerned about the efficacy of Fosamax.  She is due to update her bone density and would like an order placed today.  She has been taking vitamin D 5000 units daily recently ran out of her supply of vitamin D.  She denies any recent falls or fractures.  She remains active exercising on a regular basis.  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  She states that her husband has to help provide traction for her neck pain to be alleviated.  Patient had an MRI in 2019 and would like to have repeat imaging to evaluate for any progression of her arthritis.  Patient reports that about 3 weeks ago she was at exercise class and started to have increased pain in her right SI joint while squatting.  She has restarted home exercises that were provided at physical therapy in the past.  She has been taking Tylenol as needed for symptomatic relief.  She states that it feels like the surrounding musculature has been tight when performing stretching exercises.  She is concerned that she may further injure herself with some of the stretching exercises.  If her symptoms do not  improve she plans on notifying us at which time she would like a new referral placed to physical therapy.   Activities of Daily Living:  Patient reports morning stiffness for 30 minutes.   Patient Reports nocturnal pain.  Difficulty dressing/grooming: Denies Difficulty climbing stairs: Denies Difficulty getting out of chair: Denies Difficulty using hands for taps, buttons, cutlery, and/or writing: Denies  Review of Systems  Constitutional:  Negative for fatigue.  HENT:  Negative for mouth sores and mouth dryness.   Eyes:  Negative for dryness.  Respiratory:  Negative for shortness of breath.   Cardiovascular:  Negative for chest pain and palpitations.  Gastrointestinal:  Negative for blood in stool, constipation and diarrhea.  Endocrine: Negative for increased urination.  Genitourinary:  Negative for involuntary urination.  Musculoskeletal:  Positive for joint pain, joint pain and morning stiffness. Negative for gait problem, joint swelling, myalgias, muscle weakness, muscle tenderness and myalgias.  Skin:  Negative for color change, rash, hair loss and sensitivity to sunlight.  Allergic/Immunologic: Negative for susceptible to infections.  Neurological:  Negative for dizziness and headaches.  Hematological:  Negative for swollen glands.  Psychiatric/Behavioral:  Negative for depressed mood and sleep disturbance. The patient is not nervous/anxious.     PMFS History:  Patient Active Problem List   Diagnosis Date Noted   Abnormal CT of  the chest 10/09/2022   Pulmonary nodule 01/09/2022   Myofascial pain syndrome 05/20/2021   Spondylosis without myelopathy or radiculopathy, lumbar region 05/20/2021   Sicca syndrome ([place]) 01/27/2021   Chronic eczematoid otitis externa of both ears 11/01/2020   Other idiopathic scoliosis, lumbar region 09/30/2018   DDD (degenerative disc disease), cervical 05/06/2018   Chronic left shoulder pain 05/06/2018   History of palpitations 07/29/2017    Osteoporosis 07/28/2017   Family history of premature CAD 07/28/2017   Primary insomnia 09/26/2008   GAD (generalized anxiety disorder) 09/28/2007   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    Past Medical History:  Diagnosis Date   Anxiety    Arthritis    Family history of dementia, mother, age > 55 07/28/2017   Osteopenia    Temporomandibular joint disorder (TMJ)     Family History  Problem Relation Age of Onset   Coronary artery disease Mother 25   Hypertension Mother    Osteoporosis Mother    COPD Mother    Alzheimer's disease Mother    Hypertension Father    Heart attack Father 45   Heart disease Father    Glaucoma Son    Healthy Son    Breast cancer Other    Lung cancer Other    Lung cancer Maternal Aunt    Heart disease Sister    Healthy Son    Lung cancer Cousin    Breast cancer Cousin    Past Surgical History:  Procedure Laterality Date   BREAST SURGERY     dental implant  2022   WISDOM TOOTH EXTRACTION     Social History   Social History Narrative   Occupation: Copywriter, advertising, 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  3 sons   Immunization History  Administered Date(s) Administered   Influenza,inj,Quad PF,6+ Mos 11/22/2018, 09/15/2019   Influenza-Unspecified 09/12/2020   PFIZER(Purple Top)SARS-COV-2 Vaccination 03/02/2020, 03/23/2020   PPD Test 10/22/2014, 11/05/2014   Td 12/03/2006   Tdap 12/19/2016   Zoster Recombinat (Shingrix) 03/07/2021, 08/07/2021     Objective: Vital Signs: BP 127/78 (BP Location: Left Arm, Patient Position: Sitting, Cuff Size: Normal)   Pulse 61   Resp 14   Ht 5\' 4"  (1.626 m)   Wt 116 lb (52.6 kg)   BMI 19.91 kg/m    Physical Exam Vitals and nursing note reviewed.  Constitutional:      Appearance: She is well-developed.  HENT:     Head: Normocephalic and atraumatic.  Eyes:     Conjunctiva/sclera: Conjunctivae normal.  Cardiovascular:     Rate and Rhythm: Normal rate and regular rhythm.     Heart sounds: Normal  heart sounds.  Pulmonary:     Effort: Pulmonary effort is normal.     Breath sounds: Normal breath sounds.  Abdominal:     General: Bowel sounds are normal.     Palpations: Abdomen is soft.  Musculoskeletal:     Cervical back: Normal range of motion.  Skin:    General: Skin is warm and dry.     Capillary Refill: Capillary refill takes less than 2 seconds.  Neurological:     Mental Status: She is alert and oriented to person, place, and time.  Psychiatric:        Behavior: Behavior normal.      Musculoskeletal Exam: C-spine has limited range of motion with lateral rotation.  No midline spinal tenderness.  Tenderness over the right SI joint.  Shoulder joints, elbow joints, wrist joints, MCPs, PIPs,  DIPs have good range of motion with no synovitis.  PIP and DIP thickening consistent with osteoarthritis of both hands.  Complete fist formation bilaterally.  Hip joints have good range of motion with no groin pain.  Some tenderness over the right trochanteric bursa.  Knee joints have good range of motion with no warmth or effusion.  Ankle joints have good range of motion with no tenderness or joint swelling.  CDAI Exam: CDAI Score: -- Patient Global: --; Provider Global: -- Swollen: --; Tender: -- Joint Exam 03/15/2023   No joint exam has been documented for this visit   There is currently no information documented on the homunculus. Go to the Rheumatology activity and complete the homunculus joint exam.  Investigation: No additional findings.  Imaging: No results found.  Recent Labs: Lab Results  Component Value Date   WBC 5.4 01/18/2023   HGB 13.5 01/18/2023   PLT 234.0 01/18/2023   NA 142 11/23/2022   K 3.8 11/23/2022   CL 104 11/23/2022   CO2 30 11/23/2022   GLUCOSE 80 11/23/2022   BUN 17 11/23/2022   CREATININE 0.93 11/23/2022   BILITOT 0.6 11/23/2022   ALKPHOS 36 (L) 03/16/2022   AST 21 11/23/2022   ALT 12 11/23/2022   PROT 6.9 11/23/2022   ALBUMIN 4.4 03/16/2022    CALCIUM 9.7 11/23/2022   GFRAA 98 04/04/2018    Speciality Comments: Fosamax started December 2022   Procedures:  No procedures performed Allergies: Patient has no known allergies.     Assessment / Plan:     Visit Diagnoses: Other osteoporosis without current pathological fracture - DEXA 03/14/2021: T-score LFN-2.6, -0.6% change from previous BMD. AP spine T-score -2.2.  -3.5% decrease in BMD of spine.due to update DEXA in late March/early April 2024.  Future order for DEXA placed today. No recent falls or fractures.  She remains active exercising on a regular basis. She has been taking Fosamax 70 mg 1 tablet by mouth once weekly since December 2022.  She has been tolerating Fosamax without any side effects and has not missed any doses recently.  According to the patient until recently she had been drinking coffee about 15 minutes after taking her dose of Fosamax, so she has been concerned about the absorption/efficacy of Fosamax.  She has been taking vitamin D 5000 units daily but recently ran out of her over-the-counter supplement.  Vitamin D was 45 on 11/23/2022.  Vitamin D will be checked today along with CBC and CMP.  - Plan: DG BONE DENSITY (DXA)  Medication monitoring encounter - Vitamin D was 45 on 11/23/22.   CBC and CMP updated on 11/23/22.  Vitamin D, CBC, and CMP will be rechecked today. Plan: CBC with Differential/Platelet, COMPLETE METABOLIC PANEL WITH GFR  Vitamin D deficiency -Vitamin D was 45 on 11/23/2022.  She has been taking vitamin D 5000 units daily but recently ran out of her over-the-counter supplement.  Vitamin D will be rechecked today.  Plan: VITAMIN D 25 Hydroxy (Vit-D Deficiency, Fractures)  Sicca syndrome ([facility]) - Positive ANA, negative Ro, negative Lyme, negative RF.  Family history of Sjogren's: mother. No sicca symptoms currently.   Primary osteoarthritis of both hands: PIP and DIP thickening consistent with osteoarthritis of both hands.  Some tenderness  over the right second PIP joint.  No synovitis noted.  Complete fist formation bilaterally.  Primary osteoarthritis of both feet: She is not experiencing any increased discomfort in her feet at this time.   Trapezius muscle spasm: Trigger  point injections performed in October 2022.  She has some trapezius muscle tension and tenderness bilaterally.  No muscle spasms currently.  DDD (degenerative disc disease), cervical - .  MRI of the C-spine on 04/19/18, which revealed multilevel degenerative disc disease and facet arthorsis.  C7-T1 right-sided foraminal narrowing was noted.  She continues to have chronic neck pain and stiffness.  On examination she has limited range of motion with lateral rotation.  Her symptoms are typically exacerbated by working as a Copywriter, advertising 3 days a week.  She has temporary relief with traction.  She has tried performing home exercises with minimal improvement in her symptoms.  Most of her discomfort has been on the left side of her neck.  X-rays of the C-spine were updated today.  She would like to schedule an updated MRI of the C-spine to evaluate for radiographic progression and further damage.- Plan: XR Cervical Spine 2 or 3 views  Neck pain -She presents today with ongoing pain and stiffness in her neck.  Most of her discomfort has been on the left side of her neck.  She has tried home exercises.  X-rays of the C-spine were updated today.  She would also like to proceed with an updated MRI of the C-spine for further evaluation given the chronicity and severity of pain.  Plan: XR Cervical Spine 2 or 3 views  Other idiopathic scoliosis, lumbar region  Chronic right SI joint pain - Previously followed by [facility].  Completed physical therapy.  She recently had an exacerbation of symptoms about 3 weeks ago while performing an exercise class at the gym.  According to the patient she had a recurrence of symptoms while squatting.  She has restarted home exercises provided  by physical therapy in the past.  She will notify us if her symptoms do not improve at which time we can put in a new referral for physical therapy.  Piriformis syndrome of right side: She has some tenderness upon palpation today.  Encourage patient to perform stretching exercises daily.  She will notify us if her symptoms persist or worsen at which time we can refer her to physical therapy.  Lateral epicondylitis, right elbow: Resolved.        Orders: Orders Placed This Encounter  Procedures   DG BONE DENSITY (DXA)   XR Cervical Spine 2 or 3 views   CBC with Differential/Platelet   COMPLETE METABOLIC PANEL WITH GFR   VITAMIN D 25 Hydroxy (Vit-D Deficiency, Fractures)   No orders of the defined types were placed in this encounter.   Follow-Up Instructions: Return in about 6 months (around 09/15/2023) for Osteoporosis, Osteoarthritis, DDD.   Ofilia Neas, PA-C  Note - This record has been created using Dragon software.  Chart creation errors have been sought, but may not always  have been located. Such creation errors do not reflect on  the standard of medical care.

## 2023-03-05 ENCOUNTER — Ambulatory Visit: Payer: BC Managed Care – PPO | Admitting: Student

## 2023-03-15 ENCOUNTER — Ambulatory Visit: Payer: BC Managed Care – PPO | Attending: Physician Assistant | Admitting: Physician Assistant

## 2023-03-15 ENCOUNTER — Ambulatory Visit (INDEPENDENT_AMBULATORY_CARE_PROVIDER_SITE_OTHER): Payer: BC Managed Care – PPO

## 2023-03-15 ENCOUNTER — Other Ambulatory Visit: Payer: Self-pay | Admitting: *Deleted

## 2023-03-15 ENCOUNTER — Encounter: Payer: Self-pay | Admitting: Physician Assistant

## 2023-03-15 VITALS — BP 127/78 | HR 61 | Resp 14 | Ht 64.0 in | Wt 116.0 lb

## 2023-03-15 DIAGNOSIS — M4126 Other idiopathic scoliosis, lumbar region: Secondary | ICD-10-CM

## 2023-03-15 DIAGNOSIS — M5030 Other cervical disc degeneration, unspecified cervical region: Secondary | ICD-10-CM

## 2023-03-15 DIAGNOSIS — M542 Cervicalgia: Secondary | ICD-10-CM

## 2023-03-15 DIAGNOSIS — Z5181 Encounter for therapeutic drug level monitoring: Secondary | ICD-10-CM

## 2023-03-15 DIAGNOSIS — M62838 Other muscle spasm: Secondary | ICD-10-CM

## 2023-03-15 DIAGNOSIS — G5701 Lesion of sciatic nerve, right lower limb: Secondary | ICD-10-CM

## 2023-03-15 DIAGNOSIS — M3500 Sicca syndrome, unspecified: Secondary | ICD-10-CM

## 2023-03-15 DIAGNOSIS — M818 Other osteoporosis without current pathological fracture: Secondary | ICD-10-CM

## 2023-03-15 DIAGNOSIS — M19041 Primary osteoarthritis, right hand: Secondary | ICD-10-CM | POA: Diagnosis not present

## 2023-03-15 DIAGNOSIS — M19071 Primary osteoarthritis, right ankle and foot: Secondary | ICD-10-CM

## 2023-03-15 DIAGNOSIS — M19042 Primary osteoarthritis, left hand: Secondary | ICD-10-CM

## 2023-03-15 DIAGNOSIS — M533 Sacrococcygeal disorders, not elsewhere classified: Secondary | ICD-10-CM

## 2023-03-15 DIAGNOSIS — E559 Vitamin D deficiency, unspecified: Secondary | ICD-10-CM

## 2023-03-15 DIAGNOSIS — M7711 Lateral epicondylitis, right elbow: Secondary | ICD-10-CM

## 2023-03-15 DIAGNOSIS — L290 Pruritus ani: Secondary | ICD-10-CM | POA: Diagnosis not present

## 2023-03-15 DIAGNOSIS — M19072 Primary osteoarthritis, left ankle and foot: Secondary | ICD-10-CM

## 2023-03-15 DIAGNOSIS — G8929 Other chronic pain: Secondary | ICD-10-CM

## 2023-03-16 LAB — CBC WITH DIFFERENTIAL/PLATELET
Absolute Monocytes: 376 cells/uL (ref 200–950)
Basophils Absolute: 40 cells/uL (ref 0–200)
Basophils Relative: 1 %
Eosinophils Absolute: 120 cells/uL (ref 15–500)
Eosinophils Relative: 3 %
HCT: 41.2 % (ref 35.0–45.0)
Hemoglobin: 14.1 g/dL (ref 11.7–15.5)
Lymphs Abs: 1368 cells/uL (ref 850–3900)
MCH: 32.3 pg (ref 27.0–33.0)
MCHC: 34.2 g/dL (ref 32.0–36.0)
MCV: 94.3 fL (ref 80.0–100.0)
MPV: 10.4 fL (ref 7.5–12.5)
Monocytes Relative: 9.4 %
Neutro Abs: 2096 cells/uL (ref 1500–7800)
Neutrophils Relative %: 52.4 %
Platelets: 245 10*3/uL (ref 140–400)
RBC: 4.37 10*6/uL (ref 3.80–5.10)
RDW: 12.6 % (ref 11.0–15.0)
Total Lymphocyte: 34.2 %
WBC: 4 10*3/uL (ref 3.8–10.8)

## 2023-03-16 LAB — COMPLETE METABOLIC PANEL WITH GFR
AG Ratio: 1.8 (calc) (ref 1.0–2.5)
ALT: 14 U/L (ref 6–29)
AST: 21 U/L (ref 10–35)
Albumin: 4.7 g/dL (ref 3.6–5.1)
Alkaline phosphatase (APISO): 37 U/L (ref 37–153)
BUN: 11 mg/dL (ref 7–25)
CO2: 29 mmol/L (ref 20–32)
Calcium: 9 mg/dL (ref 8.6–10.4)
Chloride: 104 mmol/L (ref 98–110)
Creat: 0.73 mg/dL (ref 0.50–1.05)
Globulin: 2.6 g/dL (calc) (ref 1.9–3.7)
Glucose, Bld: 86 mg/dL (ref 65–99)
Potassium: 4.4 mmol/L (ref 3.5–5.3)
Sodium: 140 mmol/L (ref 135–146)
Total Bilirubin: 0.6 mg/dL (ref 0.2–1.2)
Total Protein: 7.3 g/dL (ref 6.1–8.1)
eGFR: 94 mL/min/{1.73_m2} (ref >=60)

## 2023-03-16 LAB — VITAMIN D 25 HYDROXY (VIT D DEFICIENCY, FRACTURES): Vit D, 25-Hydroxy: 50 ng/mL (ref 30–100)

## 2023-03-16 NOTE — Progress Notes (Signed)
CBC and CMP WNL. Vitamin D WNL

## 2023-03-19 ENCOUNTER — Ambulatory Visit (INDEPENDENT_AMBULATORY_CARE_PROVIDER_SITE_OTHER): Payer: BC Managed Care – PPO | Admitting: Family Medicine

## 2023-03-19 ENCOUNTER — Encounter: Payer: Self-pay | Admitting: Family Medicine

## 2023-03-19 VITALS — BP 110/70 | HR 64 | Temp 98.4°F | Ht 64.0 in | Wt 116.4 lb

## 2023-03-19 DIAGNOSIS — F4323 Adjustment disorder with mixed anxiety and depressed mood: Secondary | ICD-10-CM

## 2023-03-19 DIAGNOSIS — F411 Generalized anxiety disorder: Secondary | ICD-10-CM | POA: Diagnosis not present

## 2023-03-19 DIAGNOSIS — M818 Other osteoporosis without current pathological fracture: Secondary | ICD-10-CM | POA: Diagnosis not present

## 2023-03-19 DIAGNOSIS — M3500 Sicca syndrome, unspecified: Secondary | ICD-10-CM

## 2023-03-19 DIAGNOSIS — M5030 Other cervical disc degeneration, unspecified cervical region: Secondary | ICD-10-CM

## 2023-03-19 DIAGNOSIS — Z0000 Encounter for general adult medical examination without abnormal findings: Secondary | ICD-10-CM

## 2023-03-19 DIAGNOSIS — Z780 Asymptomatic menopausal state: Secondary | ICD-10-CM | POA: Diagnosis not present

## 2023-03-19 LAB — CBC WITH DIFFERENTIAL/PLATELET
Basophils Absolute: 0 10*3/uL (ref 0.0–0.1)
Basophils Relative: 0.7 % (ref 0.0–3.0)
Eosinophils Absolute: 0.1 10*3/uL (ref 0.0–0.7)
Eosinophils Relative: 2.3 % (ref 0.0–5.0)
HCT: 41 % (ref 36.0–46.0)
Hemoglobin: 13.9 g/dL (ref 12.0–15.0)
Lymphocytes Relative: 34.7 % (ref 12.0–46.0)
Lymphs Abs: 1.6 10*3/uL (ref 0.7–4.0)
MCHC: 33.9 g/dL (ref 30.0–36.0)
MCV: 95.4 fl (ref 78.0–100.0)
Monocytes Absolute: 0.4 10*3/uL (ref 0.1–1.0)
Monocytes Relative: 8.8 % (ref 3.0–12.0)
Neutro Abs: 2.5 10*3/uL (ref 1.4–7.7)
Neutrophils Relative %: 53.5 % (ref 43.0–77.0)
Platelets: 242 10*3/uL (ref 150.0–400.0)
RBC: 4.3 Mil/uL (ref 3.87–5.11)
RDW: 13.2 % (ref 11.5–15.5)
WBC: 4.6 10*3/uL (ref 4.0–10.5)

## 2023-03-19 LAB — LIPID PANEL
Cholesterol: 221 mg/dL — ABNORMAL HIGH (ref 0–200)
HDL: 88.1 mg/dL (ref >39.00)
LDL Cholesterol: 118 mg/dL — ABNORMAL HIGH (ref 0–99)
NonHDL: 133.16
Total CHOL/HDL Ratio: 3
Triglycerides: 75 mg/dL (ref 0.0–149.0)
VLDL: 15 mg/dL (ref 0.0–40.0)

## 2023-03-19 LAB — COMPREHENSIVE METABOLIC PANEL
ALT: 15 U/L (ref 0–35)
AST: 25 U/L (ref 0–37)
Albumin: 4.7 g/dL (ref 3.5–5.2)
Alkaline Phosphatase: 35 U/L — ABNORMAL LOW (ref 39–117)
BUN: 13 mg/dL (ref 6–23)
CO2: 32 mEq/L (ref 19–32)
Calcium: 9.1 mg/dL (ref 8.4–10.5)
Chloride: 101 mEq/L (ref 96–112)
Creatinine, Ser: 0.82 mg/dL (ref 0.40–1.20)
GFR: 77.09 mL/min (ref >60.00)
Glucose, Bld: 99 mg/dL (ref 70–99)
Potassium: 4.5 mEq/L (ref 3.5–5.1)
Sodium: 140 mEq/L (ref 135–145)
Total Bilirubin: 0.9 mg/dL (ref 0.2–1.2)
Total Protein: 7.2 g/dL (ref 6.0–8.3)

## 2023-03-19 MED ORDER — BUPROPION HCL ER (XL) 150 MG PO TB24: 150.0000 mg | ORAL_TABLET | Freq: Every day | ORAL | 5 refills | Status: DC

## 2023-03-19 NOTE — Progress Notes (Unsigned)
Subjective  Chief Complaint  Patient presents with   Annual Exam    Pt here for Annual Exam and is currently fasting    HPI: Christine Ford is a 62 y.o. female who presents to [facility] [place] today for a Female Wellness Visit. She also has the concerns and/or needs as listed above in the chief complaint. These will be addressed in addition to the Health Maintenance Visit.   Wellness Visit: annual visit with health maintenance review and exam {With-without:32421} Pap  *** Chronic disease f/u and/or acute problem visit: (deemed necessary to be done in addition to the wellness visit): ***  Assessment  1. Annual physical exam   2. Other osteoporosis without current pathological fracture   3. Sicca syndrome [facility])      Plan  Female Wellness Visit: Age appropriate Health Maintenance and Prevention measures were discussed with patient. Included topics are cancer screening recommendations, ways to keep healthy (see AVS) including dietary and exercise recommendations, regular eye and dental care, use of seat belts, and avoidance of moderate alcohol use and tobacco use.  BMI: discussed patient's BMI and encouraged positive lifestyle modifications to help get to or maintain a target BMI. HM needs and immunizations were addressed and ordered. See below for orders. See HM and immunization section for updates. Routine labs and screening tests ordered including cmp, cbc and lipids where appropriate. Discussed recommendations regarding Vit D and calcium supplementation (see AVS)  Chronic disease management visit and/or acute problem visit: *** Follow up: No follow-ups on file.  Orders Placed This Encounter  Procedures   CBC with Differential/Platelet   Comprehensive metabolic panel   Lipid panel   No orders of the defined types were placed in this encounter.     Body mass index is 19.98 kg/m. Wt Readings from Last 3 Encounters:  03/19/23 116 lb 6.4 oz (52.8 kg)   03/15/23 116 lb (52.6 kg)  01/18/23 117 lb 6.4 oz (53.3 kg)     Patient Active Problem List   Diagnosis Date Noted   Spondylosis without myelopathy or radiculopathy, lumbar region 05/20/2021    Priority: High   DDD (degenerative disc disease), cervical 05/06/2018    Priority: High    Multilevel spondylosis was noted with C4-5, C5-6, C6-7 narrowing.   Anterior spurring and posterior spurring was noted.  Facet joint arthropathy was noted.      Osteoporosis 07/28/2017    Priority: High    Managed by rheum. Dr. Estanislado Pandy Last [name] 2022: T = - 2.6 lowest. On fosamax  Original dx by [facility].     Primary insomnia 09/26/2008    Priority: High    Previous evaluation by sleep medicine.  Thought to be due to psychophysiologic insomnia, however also consistent with TMJ and anxiety.  CBT was recommended as well as follow-up with dentist.    GAD (generalized anxiety disorder) 09/28/2007    Priority: High    Was on lexapro 10 2020-2023; stopped due to GI dysfunction.  Stressful home life/marriage. Has been to counseling.     Sicca syndrome ([place]) 01/27/2021    Priority: Medium     She has chronic sicca symptoms. +ANA 1:160 NS, -Ro,-LA, -RF. Her mother has history of Sjogren's syndrome.    Chronic left shoulder pain 05/06/2018    Priority: Medium    Family history of premature CAD 07/28/2017    Priority: Medium     Dad passed age     Pulmonary nodule 01/09/2022    Priority:  Low    6 mm likely benign found incidentally on chest CT screening January 2020.  Recommend follow-up in 12 months. F/u 05/2022: multiple benign appearing nodules. 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 Dr. Lamonte Sakai    Myofascial pain syndrome 05/20/2021   Health Maintenance  Topic Date Due   MAMMOGRAM  02/20/2023   DEXA SCAN  03/15/2023   COVID-19 Vaccine (3 - Pfizer risk series) 04/04/2023 (Originally 04/20/2020)   PAP SMEAR-Modifier  10/19/2024   DTaP/Tdap/Td (3 - Td or Tdap) 12/19/2026   COLONOSCOPY (Pts 45-53yrs Insurance coverage will need to be confirmed)  05/17/2031   Hepatitis C Screening  Completed   HIV Screening  Completed   Zoster Vaccines- Shingrix  Completed   HPV VACCINES  Aged Out   INFLUENZA VACCINE  Discontinued   Immunization History  Administered Date(s) Administered   Influenza,inj,Quad PF,6+ Mos 11/22/2018, 09/15/2019   Influenza-Unspecified 09/12/2020   PFIZER(Purple Top)SARS-COV-2 Vaccination 03/02/2020, 03/23/2020   PPD Test 10/22/2014, 11/05/2014   Td 12/03/2006   Tdap 12/19/2016   Zoster Recombinat (Shingrix) 03/07/2021, 08/07/2021   We updated and reviewed the patient's past history in detail and it is documented below. Allergies: Patient has No Known Allergies. Past Medical History Patient  has a past medical history of Anxiety, Arthritis, Family history of dementia, mother, age > 74 (07/28/2017), Osteopenia, and Temporomandibular joint disorder (TMJ). Past Surgical History Patient  has a past surgical history that includes Breast surgery; Wisdom tooth extraction; and dental implant (2022). Family History: Patient family history includes Alzheimer's disease in her mother; Breast cancer in her cousin and another family member; COPD in her mother; Coronary artery disease (age of onset: 47) in her mother; Glaucoma in her son; Healthy in her son and son; Heart attack (age of onset: 56) in her father; Heart disease in her father and sister; Hypertension in her father and mother; Lung cancer in her cousin, maternal aunt, and another family member; Osteoporosis in her mother. Social History:  Patient   reports that she has never smoked. She has been exposed to tobacco smoke. She has never used smokeless tobacco. She reports current alcohol use. She reports that she does not use drugs.  Review of Systems: Constitutional: negative for fever or malaise Ophthalmic: negative for photophobia, double vision or loss of vision Cardiovascular: negative for chest pain, dyspnea on exertion, or new LE swelling Respiratory: negative for SOB or persistent cough Gastrointestinal: negative for abdominal pain, change in bowel habits or melena Genitourinary: negative for dysuria or gross hematuria, no abnormal uterine bleeding or disharge Musculoskeletal: negative for new gait disturbance or muscular weakness Integumentary: negative for new or persistent rashes, no breast lumps Neurological: negative for TIA or stroke symptoms Psychiatric: negative for SI or delusions Allergic/Immunologic: negative for hives  Patient Care Team    Relationship Specialty Notifications Start End  Leamon Arnt, MD PCP - General Family Medicine  01/27/21   Paula Compton, MD Consulting Physician Obstetrics and Gynecology  11/23/11   Bo Merino, MD Consulting Physician Rheumatology  07/28/19   Thornell Mule,  Randall Hiss, MD Attending Physician Otolaryngology  03/07/21   Collene Gobble, MD Consulting Physician Pulmonary Disease  03/19/23     Objective  Vitals: BP 110/70   Pulse 64   Temp 98.4 F (36.9 C)   Ht 5\' 4"  (1.626 m)   Wt 116 lb 6.4 oz (52.8 kg)   SpO2 98%   BMI 19.98 kg/m  General:  Well developed, well nourished, no acute distress  Psych:  Alert and orientedx3,normal mood and affect HEENT:  Normocephalic, atraumatic, non-icteric sclera,  supple neck without adenopathy, mass or thyromegaly Cardiovascular:  Normal S1, S2, RRR without gallop, rub or murmur Respiratory:  Good breath sounds bilaterally, CTAB with normal respiratory effort Gastrointestinal: normal bowel sounds, soft, non-tender, no noted masses. No  HSM MSK: no deformities, contusions. Joints are without erythema or swelling.  Skin:  Warm, no rashes or suspicious lesions noted Neurologic:    Mental status is normal. CN 2-11 are normal. Gross motor and sensory exams are normal. Normal gait. No tremor Breast Exam: No mass, skin retraction or nipple discharge is appreciated in either breast. No axillary adenopathy. Fibrocystic changes {Actions; are/are not:16769} noted Pelvic Exam: Normal external genitalia, no vulvar or vaginal lesions present. Clear cervix w/o CMT. Bimanual exam reveals a nontender fundus w/o masses, nl size. No adnexal masses present. No inguinal adenopathy. A PAP smear {WAS/WAS NOT:[id number]::"was not"} performed.   Commons side effects, risks, benefits, and alternatives for medications and treatment plan prescribed today were discussed, and the patient expressed understanding of the given instructions. Patient is instructed to call or message via MyChart if he/she has any questions or concerns regarding our treatment plan. No barriers to understanding were identified. We discussed Red Flag symptoms and signs in detail. Patient expressed understanding regarding what to do in case of urgent or emergency type symptoms.  Medication list was reconciled, printed and provided to the patient in AVS. Patient instructions and summary information was reviewed with the patient as documented in the AVS. This note was prepared with assistance of Dragon voice recognition software. Occasional wrong-word or sound-a-like substitutions may have occurred due to the inherent limitations of voice recognition software

## 2023-03-19 NOTE — Patient Instructions (Signed)
Please return in 6 weeks for mood recheck.   I will release your lab results to you on your MyChart account with further instructions. You may see the results before I do, but when I review them I will send you a message with my report or have my assistant call you if things need to be discussed. Please reply to my message with any questions. Thank you!   If you have any questions or concerns, please don't hesitate to send me a message via MyChart or call the office at [phone]. Thank you for visiting with [place] today! It's our pleasure caring for you.

## 2023-03-21 ENCOUNTER — Encounter: Payer: Self-pay | Admitting: Family Medicine

## 2023-03-22 ENCOUNTER — Encounter: Payer: Self-pay | Admitting: Neurology

## 2023-03-22 ENCOUNTER — Ambulatory Visit: Payer: BC Managed Care – PPO | Admitting: Neurology

## 2023-03-22 VITALS — BP 133/87 | HR 68 | Ht 64.0 in | Wt 117.0 lb

## 2023-03-22 DIAGNOSIS — Z818 Family history of other mental and behavioral disorders: Secondary | ICD-10-CM | POA: Diagnosis not present

## 2023-03-22 DIAGNOSIS — R4189 Other symptoms and signs involving cognitive functions and awareness: Secondary | ICD-10-CM

## 2023-03-22 DIAGNOSIS — R488 Other symbolic dysfunctions: Secondary | ICD-10-CM | POA: Diagnosis not present

## 2023-03-22 NOTE — Progress Notes (Signed)
[facility]  Provider:  Dr Atul Delucia Referring Provider: Leamon Arnt, MD Primary Care Physician:  Leamon Arnt, MD  Chief Complaint  Patient presents with   Follow-up    RM 1 alone Pt is well, reports she is having complications with calculations like drawing a blank, word finding, dates and recall. She has had these symptoms for about a year. Also mention being Dx with a sleep disorder as a teen.     HPI:  Christine Ford is a 62 y.o. female and seen here upon referral from Dr. Jonni Sanger for a Consultation/ Evaluation of memory problems.  This patient reports onset of problems to calculate and remember numbers over a period of 12 -24 months.  Subjective memory loss but indications of slowed thought process and increased anxiety may help to differentiate this. She reports problems to hold a thought and feels slower in thoughts, she leaves sticky notes everywhere. Needs notes.   The patient reports having to ask for words , for the meaning of words she should be familiar with. She has recently forgotten his younger sons phone number.    She has a family history of Alzheimer's dementia, her mother was affected on her late 57s and died at age 37.  She feels she resembles her mothers physique and has sjoegrens, osteoporosis. She died of complication from Covid, with underlying conditions of dementia COPD, CHF, Hashimoto.  Father died at age 74 of a heart attack, had aortic stenosis.  She is the youngest of three, a brother has been retired from [employer]- 64, he is music talented.   Sister is 71 years old and retired at 42, Education administrator business. She is a Media planner.  She has 3 sons age between 66 , 76 and 16.   No history of TBI, concussion or contusions. No MVA. No history of alcohol or tobacco use, no addiction .   PCP just started her on wellbutrin, but she has not yet filled the script.   Over 10 years ago she used [place] for Insomnia and her physician then weaned her  off. No other medications were used. She was on lexapro for anxiety  for about 3 years , one time 25 years ago-  with the death of her mother for a year or so.  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  Her most restorative sleep is the late morning hours and that's when she has to get up to work. She works as  Copywriter, advertising.    Review of Systems: Out of a complete 14 system review, the patient complains of only the following symptoms, and all other reviewed systems are negative.   Memory loss: " I couldn't figure out the tip in a restaurant recently ". My recall is so bad-  I am worried and I panic.    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  3 sons   Social Determinants of Radio broadcast assistant Strain: Not on file  Food Insecurity: Not on file  Transportation Needs: Not on file  Physical Activity: Not on file  Stress: Not on file  Social Connections: Not on file  Intimate Partner Violence: Not on file    Family History  Problem Relation Age of Onset   Coronary artery disease Mother 66   Hypertension Mother    Osteoporosis Mother    COPD Mother    Alzheimer's disease Mother    Hypertension  Father    Heart attack Father 58   Heart disease Father    Glaucoma Son    Healthy Son    Breast cancer Other    Lung cancer Other    Lung cancer Maternal Aunt    Heart disease Sister    Healthy Son    Lung cancer Cousin    Breast cancer Cousin     Past Medical History:  Diagnosis Date   Anxiety    Arthritis    Family history of dementia, mother, age > 82 07/28/2017   Osteopenia    Temporomandibular joint disorder (TMJ)     Past Surgical History:  Procedure Laterality Date   BREAST SURGERY     dental implant  2022   WISDOM TOOTH EXTRACTION      Current Outpatient Medications  Medication Sig Dispense Refill   alendronate (FOSAMAX) 70 MG tablet TAKE 1 TABLET(70 MG) BY MOUTH 1 TIME A WEEK WITH A FULL GLASS OF WATER AND ON AN EMPTY STOMACH 4 tablet 5   buPROPion (WELLBUTRIN XL) 150 MG 24 hr tablet Take 1 tablet (150 mg total) by mouth daily. 30 tablet 5   Cholecalciferol (VITAMIN D3) 1000 units CAPS      Multiple Vitamins-Minerals (MULTIVITAMIN,TX-MINERALS) tablet Take 1 tablet by mouth daily.     No current facility-administered medications for this visit.    Allergies as of 03/22/2023   (No Known Allergies)    Vitals: BP 133/87   Pulse 68   Ht 5\' 4"  (1.626 m)   Wt 117 lb (53.1 kg)   BMI 20.08 kg/m  Last Weight:  Wt Readings from Last 1 Encounters:  03/22/23 117 lb (53.1 kg)   Last Height:   Ht Readings from Last 1 Encounters:  03/22/23 5\' 4"  (1.626 m)   Last BMI: 20.08 Physical exam:  General: The patient is awake, alert and appears not in acute distress.  The patient is well groomed. Head: Normocephalic, atraumatic.  Neck is supple. Neck circumference:12.5" . Mallampati 2 Cardiovascular:  Regular rate and palpable peripheral pulse:  Respiratory: clear to auscultation.  Mallampati, Skin:  Without evidence of edema, or rash Trunk: BMI is low   and patient  has normal posture.   Neurologic exam : The patient is awake and alert, oriented to place and  time.   Memory subjective  described as impaired .  MMSE at PCP was 28/ 30       No data to display            03/22/2023    2:06 PM  [facility] Cognitive Assessment   Visuospatial/ Executive (0/5)  5  Naming (0/3) 3  Attention: Read list of digits (0/2) 2  Attention: Read list of letters (0/1) 1  Attention: Serial 7 subtraction starting at 100 (0/3) 2  Language: Repeat phrase (0/2) 2  Language : Fluency (0/1) 1  Abstraction (0/2) 2  Delayed Recall (0/5) 5  Orientation (0/6) 6  Total 29      There is a normal attention span & concentration ability.  Speech is fluent without  dysarthria, dysphonia or aphasia.  Mood and affect are appropriate.  Cranial nerves: Pupils are equal and briskly reactive to light. Funduscopic exam without  evidence of pallor or edema. Extraocular movements  in vertical and horizontal planes intact and without nystagmus. Visual fields by finger perimetry are intact. Hearing to finger rub intact.  Facial sensation intact to fine touch. Facial motor strength is symmetric and tongue and uvula move midline.  Motor exam:   Normal tone and normal muscle bulk and symmetric normal strength in all extremities. Grip Strength is symmetric .  Proximal strength of shoulder muscles and hip flexors was full.  Sensory:  Fine touch and vibration were intact . Proprioception was tested in the upper extremities only and was  normal.  Coordination: Rapid alternating movements in the fingers/hands were normal. Finger-to-nose maneuver was tested and showed no evidence of ataxia, dysmetria or tremor.  Gait and station: Patient walked normal- fast, steady , turns without problem.   Deep tendon reflexes: in the  upper and lower extremities are symmetric and  brisk - . Babinski maneuver response is downgoing.   Assessment/ Plan:  Total time for face to face interview and examination, for review of  images and laboratory testing, neurophysiology testing and pre-existing  records, including out-of -network , was 45 minutes. Assessment is as follows here:   I reviewed the recent labs, non need to be repeated, just ATN added.  The patient recently moved form [place] and had trouble to buy a new one. Apartment dwellers .  1) Subjective memory loss but indications of slowed thought process and increased anxiety may help to differentiate this. She reports problems to hold a thought and feels slower in typing.   The patient reports having to ask for words , for the meaning of words she should be familiar with   2) Family history of alzheimer's dementia in her mother -  manifesting as disorientation to time and place and short term memory loss.  Work up planned after today includes:   1) MRI brain , with and without . The patient  has had a lab panel with PCP on 03-19-2023.  2) ATN  3) neuropsychology evaluation- needed in 2-3 months for completion.   Than - if needed, PET amyloid to be ordered.  I agree with anxiety treatment first.    Larey Seat, MD

## 2023-03-22 NOTE — Patient Instructions (Signed)
Work up planned after today includes:    1) MRI brain , with and without . The patient  has had a lab panel with PCP on 03-19-2023.  2) ATN  3) neuropsychology evaluation- needed in 2-3 months for completion.   Than - if needed, PET amyloid to be ordered.  I agree with anxiety treatment first.

## 2023-03-23 ENCOUNTER — Telehealth: Payer: Self-pay | Admitting: Neurology

## 2023-03-23 NOTE — Telephone Encounter (Signed)
Urgent referral sent to [facility], phone # [phone].

## 2023-03-25 LAB — ATN PROFILE
A -- Beta-amyloid 42/40 Ratio: 0.123 (ref >0.102)
Beta-amyloid 40: 177.09 pg/mL
Beta-amyloid 42: 21.72 pg/mL
N -- NfL, Plasma: 2.14 pg/mL (ref 0.00–4.61)
T -- p-tau181: 0.82 pg/mL (ref 0.00–0.97)

## 2023-03-25 NOTE — Progress Notes (Signed)
X-rays of the C-spine are consistent with multilevel spondylosis and facet joint arthropathy.  MRI order for C-spine has been placed as requested by the patient.

## 2023-04-02 ENCOUNTER — Ambulatory Visit (INDEPENDENT_AMBULATORY_CARE_PROVIDER_SITE_OTHER)
Admission: RE | Admit: 2023-04-02 | Discharge: 2023-04-02 | Disposition: A | Discharge status: Home / Self Care | Payer: BC Managed Care – PPO | Source: Ambulatory Visit | Attending: Family Medicine | Admitting: Family Medicine

## 2023-04-02 DIAGNOSIS — M818 Other osteoporosis without current pathological fracture: Secondary | ICD-10-CM | POA: Diagnosis not present

## 2023-04-02 DIAGNOSIS — Z780 Asymptomatic menopausal state: Secondary | ICD-10-CM | POA: Diagnosis not present

## 2023-04-04 NOTE — Progress Notes (Signed)
DEXA consistent with osteoporosis.  Patient has been on fosamax since December 2022.  Results and treatment options will need to be discussed at upcoming follow up visit.

## 2023-04-05 DIAGNOSIS — M542 Cervicalgia: Secondary | ICD-10-CM | POA: Diagnosis not present

## 2023-04-06 ENCOUNTER — Encounter: Payer: Self-pay | Admitting: *Deleted

## 2023-04-07 ENCOUNTER — Telehealth: Payer: Self-pay | Admitting: Neurology

## 2023-04-07 NOTE — Telephone Encounter (Signed)
Pt is asking for a call back re: MRI, it needs to be on a Monday for her, please call.

## 2023-04-08 NOTE — Telephone Encounter (Signed)
Sending to GI since the patient needs a Monday. [place] Numbers: [phone] exp. 03/23/23-04/21/23 for [facility] [phone]

## 2023-04-09 ENCOUNTER — Telehealth: Payer: Self-pay

## 2023-04-09 NOTE — Telephone Encounter (Signed)
MRI Cervical Spine performed on 04/05/2023 MRI report received from: [facility]  Impression: Cervical spondylosis appears worst at C6-7 where there is mild bilateral foraminal narrowing. Scattered facet degenerative disease appears worst on the C2-3 and C3-4 but there is no marrow edema in the facets. The central canal is open at all levels.  Sherron Ales, PA-C and Dr. Corliss Skains reviewed report and Christine Ford called patient and discussed results.   A copy of the report has been mailed to the patient per her request and original has been sent to the scan center.

## 2023-04-16 ENCOUNTER — Encounter: Payer: Self-pay | Admitting: Rheumatology

## 2023-04-23 ENCOUNTER — Ambulatory Visit: Payer: BC Managed Care – PPO | Admitting: Family Medicine

## 2023-04-28 ENCOUNTER — Encounter: Payer: Self-pay | Admitting: Physician Assistant

## 2023-04-30 ENCOUNTER — Ambulatory Visit: Payer: BC Managed Care – PPO | Admitting: Family Medicine

## 2023-05-12 ENCOUNTER — Other Ambulatory Visit: Payer: Self-pay | Admitting: Rheumatology

## 2023-05-12 NOTE — Telephone Encounter (Signed)
Last Fill: 11/18/2022  Labs: 03/19/2023 alkaline phosphatase 35  Next Visit: 09/13/2023  Last Visit: 03/15/2023  DX: Other osteoporosis without current pathological fracture   Current Dose per office note on 03/15/2023: Fosamax 70 mg 1 tablet by mouth once weekly   Okay to refill fosamax?

## 2023-05-25 ENCOUNTER — Encounter: Payer: Self-pay | Admitting: Neurology

## 2023-05-25 DIAGNOSIS — M4722 Other spondylosis with radiculopathy, cervical region: Secondary | ICD-10-CM | POA: Diagnosis not present

## 2023-05-28 ENCOUNTER — Other Ambulatory Visit: Payer: BC Managed Care – PPO

## 2023-06-18 ENCOUNTER — Ambulatory Visit: Payer: BC Managed Care – PPO | Admitting: Family Medicine

## 2023-06-18 ENCOUNTER — Encounter: Payer: Self-pay | Admitting: Family Medicine

## 2023-06-18 VITALS — BP 110/78 | HR 69 | Temp 98.7°F | Ht 64.0 in | Wt 114.0 lb

## 2023-06-18 DIAGNOSIS — Z818 Family history of other mental and behavioral disorders: Secondary | ICD-10-CM | POA: Diagnosis not present

## 2023-06-18 DIAGNOSIS — F411 Generalized anxiety disorder: Secondary | ICD-10-CM

## 2023-06-18 DIAGNOSIS — R4189 Other symptoms and signs involving cognitive functions and awareness: Secondary | ICD-10-CM

## 2023-06-18 MED ORDER — BUPROPION HCL ER (XL) 150 MG PO TB24: 150.0000 mg | ORAL_TABLET | Freq: Every day | ORAL | 3 refills | Status: DC

## 2023-06-18 NOTE — Progress Notes (Signed)
Subjective  CC:  Chief Complaint  Patient presents with   Anxiety    HPI: Christine Ford is a 62 y.o. female who presents to the office today to address the problems listed above in the chief complaint, mood problems. Patient started Wellbutrin about 4 weeks ago.  For her anxiety.  Fortunately, she has had a positive response without adverse effects.  Noticing that she is less anxious, feeling less stressed at work.  Able to manage stressors better.  No longer with any panic symptoms.  Uncertain if thinking has improved yet but possibly. Subjective cognitive decline: Reviewed neurology notes and workup to date.  She has a brain MRI scheduled for next month.  She had [facility] Alzheimer's markers screening which fortunately were negative.  She will follow-up with neuro psychology for formal evaluation as well.  She is feeling more reassured.     06/18/2023   11:27 AM 03/19/2023   10:19 AM 01/18/2023    1:40 PM  Depression screen PHQ 2/9  Decreased Interest 0 0 0  Down, Depressed, Hopeless 0 1 0  PHQ - 2 Score 0 1 0      06/18/2023   11:27 AM 03/19/2023   10:19 AM 03/16/2022    9:15 AM 05/19/2021    8:33 AM  GAD 7 : Generalized Anxiety Score  Nervous, Anxious, on Edge 0 1 0 0  Control/stop worrying 0 1 0 0  Worry too much - different things 0 1 0 0  Trouble relaxing 0 1 0 0  Restless 0 0 0 0  Easily annoyed or irritable 0 0 0 0  Afraid - awful might happen 0 0 0 0  Total GAD 7 Score 0 4 0 0  Anxiety Difficulty Not difficult at all Not difficult at all Not difficult at all Not difficult at all    Assessment  1. GAD (generalized anxiety disorder)   2. Subjective cognitive impairment   3. Family history of first degree relative with dementia      Plan  Generalized anxiety disorder coupled with stress reaction: Fortunately, improving on Wellbutrin XL 150 daily.  Tolerating well.  Will continue to monitor for therapeutic effects and reassess in 3 months.  If needed, she can follow-up  for dose titration.  Otherwise continue for 6 to 9 months and will reassess at her annual physical next year Subjective cognitive impairment: Fortunately, screens have all been normal today.  Will await brain MRI and neuropsychological testing.  No orders of the defined types were placed in this encounter.  Meds ordered this encounter  Medications   buPROPion (WELLBUTRIN XL) 150 MG 24 hr tablet    Sig: Take 1 tablet (150 mg total) by mouth daily.    Dispense:  90 tablet    Refill:  3    Changed to 90 day supply      I reviewed the patients updated PMH, FH, and SocHx.    Patient Active Problem List   Diagnosis Date Noted   Subjective cognitive impairment 03/22/2023    Priority: High   Spondylosis without myelopathy or radiculopathy, lumbar region 05/20/2021    Priority: High   DDD (degenerative disc disease), cervical 05/06/2018    Priority: High   Osteoporosis 07/28/2017    Priority: High   Primary insomnia 09/26/2008    Priority: High   GAD (generalized anxiety disorder) 09/28/2007    Priority: High   Sicca syndrome (HCC) 01/27/2021    Priority: Medium    Chronic left shoulder  pain 05/06/2018    Priority: Medium    Family history of premature CAD 07/28/2017    Priority: Medium    Pulmonary nodule 01/09/2022    Priority: Low   Chronic eczematoid otitis externa of both ears 11/01/2020    Priority: Low   Other idiopathic scoliosis, lumbar region 09/30/2018    Priority: Low   History of palpitations 07/29/2017    Priority: Low   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    Priority: Low   Family history of first degree relative with dementia 03/22/2023   Abnormal CT of the chest 10/09/2022   Myofascial pain syndrome 05/20/2021   Current Meds  Medication Sig   alendronate (FOSAMAX) 70 MG tablet TAKE 1 TABLET(70 MG) BY MOUTH 1 TIME A WEEK WITH A FULL GLASS OF WATER AND ON AN EMPTY STOMACH   Cholecalciferol (VITAMIN D3) 1000 units CAPS    Multiple Vitamins-Minerals  (MULTIVITAMIN,TX-MINERALS) tablet Take 1 tablet by mouth daily.   [DISCONTINUED] buPROPion (WELLBUTRIN XL) 150 MG 24 hr tablet Take 1 tablet (150 mg total) by mouth daily.    Allergies: Patient has No Known Allergies. Family history:  Patient family history includes Alzheimer's disease in her mother; Breast cancer in her cousin and another family member; COPD in her mother; Coronary artery disease (age of onset: 38) in her mother; Glaucoma in her son; Healthy in her son and son; Heart attack (age of onset: 17) in her father; Heart disease in her father and sister; Hypertension in her father and mother; Lung cancer in her cousin, maternal aunt, and another family member; Osteoporosis in her mother. Social History   Socioeconomic History   Marital status: Married    Spouse name: Not on file   Number of children: 3   Years of education: Not on file   Highest education level: Not on file  Occupational History   Occupation: dental hygenist    Comment: [employer]  Tobacco Use   Smoking status: Never    Passive exposure: Past   Smokeless tobacco: Never   Tobacco comments:    Passive smoke exposure   Vaping Use   Vaping Use: Never used  Substance and Sexual Activity   Alcohol use: Yes    Comment: socially   Drug use: No   Sexual activity: Yes    Partners: Male    Birth control/protection: Post-menopausal  Other Topics Concern   Not on file  Social History Narrative   Occupation: Armed forces operational officer, 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  3 sons   Social Determinants of Corporate investment banker Strain: Not on file  Food Insecurity: Not on file  Transportation Needs: Not on file  Physical Activity: Not on file  Stress: Not on file  Social Connections: Not on file     Review of Systems: Constitutional: Negative for fever malaise or anorexia Cardiovascular: negative for chest pain Respiratory: negative for SOB or persistent cough Gastrointestinal:  negative for abdominal pain  Objective  Vitals: BP 110/78   Pulse 69   Temp 98.7 F (37.1 C)   Ht 5\' 4"  (1.626 m)   Wt 114 lb (51.7 kg)   SpO2 98%   BMI 19.57 kg/m  General: no acute distress, well appearing, no apparent distress, well groomed Psych:  Alert and oriented x 3,normal mood, behavior, speech, dress, and thought processes.  Appears happier, less stressed  Commons side effects, risks, benefits, and alternatives for medications and treatment plan prescribed today were discussed, and  the patient expressed understanding of the given instructions. Patient is instructed to call or message via MyChart if he/she has any questions or concerns regarding our treatment plan. No barriers to understanding were identified. We discussed Red Flag symptoms and signs in detail. Patient expressed understanding regarding what to do in case of urgent or emergency type symptoms.  Medication list was reconciled, printed and provided to the patient in AVS. Patient instructions and summary information was reviewed with the patient as documented in the AVS. This note was prepared with assistance of Dragon voice recognition software. Occasional wrong-word or sound-a-like substitutions may have occurred due to the inherent limitations of voice recognition software

## 2023-06-21 ENCOUNTER — Ambulatory Visit: Payer: BC Managed Care – PPO | Admitting: Neurology

## 2023-06-25 DIAGNOSIS — J341 Cyst and mucocele of nose and nasal sinus: Secondary | ICD-10-CM | POA: Diagnosis not present

## 2023-06-25 DIAGNOSIS — J343 Hypertrophy of nasal turbinates: Secondary | ICD-10-CM | POA: Diagnosis not present

## 2023-06-25 DIAGNOSIS — J3489 Other specified disorders of nose and nasal sinuses: Secondary | ICD-10-CM | POA: Diagnosis not present

## 2023-07-06 ENCOUNTER — Encounter: Payer: Self-pay | Admitting: Neurology

## 2023-07-09 ENCOUNTER — Other Ambulatory Visit: Payer: BC Managed Care – PPO

## 2023-07-16 DIAGNOSIS — F419 Anxiety disorder, unspecified: Secondary | ICD-10-CM | POA: Diagnosis not present

## 2023-07-17 ENCOUNTER — Other Ambulatory Visit: Payer: BC Managed Care – PPO

## 2023-07-21 ENCOUNTER — Encounter: Payer: BC Managed Care – PPO | Admitting: Vascular Surgery

## 2023-07-21 ENCOUNTER — Encounter (HOSPITAL_COMMUNITY): Payer: BC Managed Care – PPO

## 2023-07-22 ENCOUNTER — Encounter: Payer: Self-pay | Admitting: Neurology

## 2023-07-26 ENCOUNTER — Ambulatory Visit: Payer: BC Managed Care – PPO | Admitting: Neurology

## 2023-08-13 ENCOUNTER — Other Ambulatory Visit: Payer: Self-pay | Admitting: *Deleted

## 2023-08-13 MED ORDER — ALENDRONATE SODIUM 70 MG PO TABS
70.0000 mg | ORAL_TABLET | ORAL | 0 refills | Status: DC
Start: 2023-08-13 — End: 2023-11-10

## 2023-08-13 NOTE — Telephone Encounter (Signed)
Refill request received via fax from [facility]  for Fosamax   Last Fill: 05/12/2023  Labs: 03/19/2023 Alk. Phos. 35  Next Visit: 09/13/2023  Last Visit: 03/15/2023  DX: Other osteoporosis without current pathological fracture   Current Dose per office note 03/15/2023:  Fosamax 70 mg 1 tablet by mouth once weekly   Okay to refill Fosamax?

## 2023-08-16 DIAGNOSIS — Z130 Encounter for screening for diseases of the blood and blood-forming organs and certain disorders involving the immune mechanism: Secondary | ICD-10-CM | POA: Diagnosis not present

## 2023-08-16 DIAGNOSIS — Z01419 Encounter for gynecological examination (general) (routine) without abnormal findings: Secondary | ICD-10-CM | POA: Diagnosis not present

## 2023-08-16 DIAGNOSIS — Z1231 Encounter for screening mammogram for malignant neoplasm of breast: Secondary | ICD-10-CM | POA: Diagnosis not present

## 2023-08-31 NOTE — Progress Notes (Signed)
Office Visit Note  Patient: Christine Ford             Date of Birth: 1961-02-20           MRN: [id number]             PCP: Willow Ora, MD Referring: Willow Ora, MD Visit Date: 09/13/2023 Occupation: @GUAROCC @  Subjective:  Medication management  History of Present Illness: Christine Ford is a 62 y.o. female with history of osteoporosis, osteoarthritis and sicca symptoms.  She states she continues to have dry mouth and dry eyes.  She also has some stiffness in her bilateral hands and bilateral feet.  The trapezius discomfort has improved.  She saw Dr. Cheree Ditto for cervical spine pain and was told that she has bulging disc and there was nothing more to offer.  Patient states she has been doing stretching exercises and yoga which helps.  She has intermittent discomfort in her SI joints.  Right lateral epicondyle pain has resolved.    Activities of Daily Living:  Patient reports morning stiffness for 0 minute.   Patient Denies nocturnal pain.  Difficulty dressing/grooming: Denies Difficulty climbing stairs: Denies Difficulty getting out of chair: Denies Difficulty using hands for taps, buttons, cutlery, and/or writing: Denies  Review of Systems  Constitutional:  Negative for fatigue.  HENT:  Positive for mouth dryness. Negative for mouth sores.   Eyes:  Positive for dryness.  Respiratory:  Negative for shortness of breath.   Cardiovascular:  Negative for chest pain and palpitations.  Gastrointestinal:  Negative for blood in stool, constipation and diarrhea.  Endocrine: Negative for increased urination.  Genitourinary:  Negative for difficulty urinating.  Musculoskeletal:  Positive for joint pain and joint pain. Negative for joint swelling, myalgias, morning stiffness and myalgias.  Skin:  Negative for color change, rash and sensitivity to sunlight.  Allergic/Immunologic: Negative for susceptible to infections.  Neurological:  Negative for headaches.  Hematological:   Negative for swollen glands.  Psychiatric/Behavioral:  Negative for depressed mood and sleep disturbance. The patient is not nervous/anxious.     PMFS History:  Patient Active Problem List   Diagnosis Date Noted   Subjective cognitive impairment 03/22/2023   Family history of first degree relative with dementia 03/22/2023   Abnormal CT of the chest 10/09/2022   Pulmonary nodule 01/09/2022   Myofascial pain syndrome 05/20/2021   Spondylosis without myelopathy or radiculopathy, lumbar region 05/20/2021   Sicca syndrome (HCC) 01/27/2021   Chronic eczematoid otitis externa of both ears 11/01/2020   Other idiopathic scoliosis, lumbar region 09/30/2018   DDD (degenerative disc disease), cervical 05/06/2018   Chronic left shoulder pain 05/06/2018   History of palpitations 07/29/2017   Osteoporosis 07/28/2017   Family history of premature CAD 07/28/2017   Primary insomnia 09/26/2008   GAD (generalized anxiety disorder) 09/28/2007   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    Past Medical History:  Diagnosis Date   Anxiety    Arthritis    Family history of dementia, mother, age > 65 07/28/2017   Osteopenia    Temporomandibular joint disorder (TMJ)     Family History  Problem Relation Age of Onset   Coronary artery disease Mother 60   Hypertension Mother    Osteoporosis Mother    COPD Mother    Alzheimer's disease Mother    Hypertension Father    Heart attack Father 32   Heart disease Father    Glaucoma Son    Healthy Son    Breast cancer Other  Lung cancer Other    Lung cancer Maternal Aunt    Heart disease Sister    Healthy Son    Lung cancer Cousin    Breast cancer Cousin    Past Surgical History:  Procedure Laterality Date   BREAST SURGERY     dental implant  2022   WISDOM TOOTH EXTRACTION     Social History   Social History Narrative   Occupation: Armed forces operational officer, 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  3 sons   Immunization History  Administered Date(s)  Administered   Influenza,inj,Quad PF,6+ Mos 11/22/2018, 09/15/2019   Influenza-Unspecified 09/12/2020   PFIZER(Purple Top)SARS-COV-2 Vaccination 03/02/2020, 03/23/2020   PPD Test 10/22/2014, 11/05/2014   Td 12/03/2006   Tdap 12/19/2016   Zoster Recombinant(Shingrix) 03/07/2021, 08/07/2021     Objective: Vital Signs: BP 136/84 (BP Location: Left Arm, Patient Position: Sitting, Cuff Size: Normal)   Pulse 64   Resp 15   Ht 5\' 4"  (1.626 m)   Wt 114 lb (51.7 kg)   BMI 19.57 kg/m    Physical Exam Vitals and nursing note reviewed.  Constitutional:      Appearance: She is well-developed.  HENT:     Head: Normocephalic and atraumatic.  Eyes:     Conjunctiva/sclera: Conjunctivae normal.  Cardiovascular:     Rate and Rhythm: Normal rate and regular rhythm.     Heart sounds: Normal heart sounds.  Pulmonary:     Effort: Pulmonary effort is normal.     Breath sounds: Normal breath sounds.  Abdominal:     General: Bowel sounds are normal.     Palpations: Abdomen is soft.  Musculoskeletal:     Cervical back: Normal range of motion.  Lymphadenopathy:     Cervical: No cervical adenopathy.  Skin:    General: Skin is warm and dry.     Capillary Refill: Capillary refill takes less than 2 seconds.  Neurological:     Mental Status: She is alert and oriented to person, place, and time.  Psychiatric:        Behavior: Behavior normal.      Musculoskeletal Exam: Cervical, thoracic and lumbar spine were in good range of motion.  Shoulder joints, elbow joints, wrist joints, MCPs PIPs and DIPs were in good range of motion.  She had bilateral PIP and DIP thickening.  She had a small ganglion cyst on the dorsum of her left wrist.  Hip joints and knee joints in good range of motion.  There was no tenderness over ankles or MTPs.  No synovitis was noted on the examination.  CDAI Exam: CDAI Score: -- Patient Global: --; Provider Global: -- Swollen: --; Tender: -- Joint Exam 09/13/2023   No  joint exam has been documented for this visit   There is currently no information documented on the homunculus. Go to the Rheumatology activity and complete the homunculus joint exam.  Investigation: No additional findings.  Imaging: No results found.  Recent Labs: Lab Results  Component Value Date   WBC 4.6 03/19/2023   HGB 13.9 03/19/2023   PLT 242.0 03/19/2023   NA 140 03/19/2023   K 4.5 03/19/2023   CL 101 03/19/2023   CO2 32 03/19/2023   GLUCOSE 99 03/19/2023   BUN 13 03/19/2023   CREATININE 0.82 03/19/2023   BILITOT 0.9 03/19/2023   ALKPHOS 35 (L) 03/19/2023   AST 25 03/19/2023   ALT 15 03/19/2023   PROT 7.2 03/19/2023   ALBUMIN 4.7 03/19/2023  CALCIUM 9.1 03/19/2023   GFRAA 98 04/04/2018    Speciality Comments: Fosamax started December 2022  DEXA at [facility]  April 02, 2023 DEXA scan T-score -2.4 right femoral neck, T-score -2.4 left femoral neck BMD change 3.2%  Procedures:  No procedures performed Allergies: Patient has no known allergies.   Assessment / Plan:     Visit Diagnoses: Other osteoporosis without current pathological fracture - DEXA 03/14/2021: T-score LFN-2.6, -0.6% change from previous BMD. AP spine T-score -2.2. Fosamax 70 mg 1 tablet by mouth once weekly since December 2022. -April 02, 2023 DEXA scan T-score -2.4 right femoral neck, T-score -2.4 left femoral neck BMD change 3.2%.  DEXA scan results were discussed with the patient.  Her BMD is stable.  We discussed continuing Fosamax for another 1 or 2 years and then go on a drug holiday.  Patient was in agreement.  Use of calcium rich diet and vitamin D was discussed.  She has been taking vitamin D on a regular basis.  Her vitamin D was 50 on March 15, 2023.  She has low alkaline phosphatase.  I will check phosphorus.  Plan: Phosphorus  Medication monitoring encounter -I will check her CMP as she is taking long-term Fosamax.  Plan: COMPLETE METABOLIC PANEL WITH GFR  Vitamin D deficiency-she is on  vitamin D 1000 units daily.  Her vitamin D has been stable.  Sicca syndrome (HCC) - Positive ANA, negative Ro, negative Lyme, negative RF.  Family history of Sjogren's: mother.  She continues to have sicca symptoms.  No parotid swelling or lymphadenopathy was noted.  She has no shortness of breath or palpitations.  Over-the-counter products for sicca symptoms were discussed at length.  I also discussed the option of pilocarpine but she declined.  Use of Systane eyedrops and GenTeal eye ointment at nighttime was discussed.  Primary osteoarthritis of both hands-she has bilateral PIP and DIP thickening which causes discomfort.  Hand muscle strengthening exercises were discussed.  A handout was placed in the AVS.  She appears to have a large ganglion cyst on the dorsum of her left wrist.  Observation was advised.  Primary osteoarthritis of both feet-proper fitting shoes were advised.  Trapezius muscle spasm - Trigger point injections performed in October 2022.  She currently does not have any trapezius spasm.  She says with the stretching exercises and yoga she has noticed improvement.  DDD (degenerative disc disease), cervical - MRI Cervical Spine performed on 04/05/2023.  Patient was evaluated by Dr. Cheree Ditto.  She states Dr. Cheree Ditto did not have much to offer.  Neck pain-she has intermittent discomfort.  Other idiopathic scoliosis, lumbar region  Chronic right SI joint pain - Previously followed by [facility].  She continues to have intermittent SI joint pain and piriformis syndrome.  Stretching exercises were demonstrated in the office.  Piriformis syndrome of right side  Orders: Orders Placed This Encounter  Procedures   COMPLETE METABOLIC PANEL WITH GFR   Phosphorus   No orders of the defined types were placed in this encounter.    Follow-Up Instructions: Return in about 6 months (around 03/12/2024) for Osteoporosis, Osteoarthritis.   Pollyann Savoy, MD  Note - This record has been  created using Animal nutritionist.  Chart creation errors have been sought, but may not always  have been located. Such creation errors do not reflect on  the standard of medical care.

## 2023-09-10 DIAGNOSIS — B078 Other viral warts: Secondary | ICD-10-CM | POA: Diagnosis not present

## 2023-09-13 ENCOUNTER — Encounter: Payer: Self-pay | Admitting: Rheumatology

## 2023-09-13 ENCOUNTER — Ambulatory Visit: Payer: BC Managed Care – PPO | Attending: Rheumatology | Admitting: Rheumatology

## 2023-09-13 VITALS — BP 136/84 | HR 64 | Resp 15 | Ht 64.0 in | Wt 114.0 lb

## 2023-09-13 DIAGNOSIS — M533 Sacrococcygeal disorders, not elsewhere classified: Secondary | ICD-10-CM

## 2023-09-13 DIAGNOSIS — M19042 Primary osteoarthritis, left hand: Secondary | ICD-10-CM

## 2023-09-13 DIAGNOSIS — M818 Other osteoporosis without current pathological fracture: Secondary | ICD-10-CM | POA: Diagnosis not present

## 2023-09-13 DIAGNOSIS — M19071 Primary osteoarthritis, right ankle and foot: Secondary | ICD-10-CM

## 2023-09-13 DIAGNOSIS — M3500 Sicca syndrome, unspecified: Secondary | ICD-10-CM

## 2023-09-13 DIAGNOSIS — M19072 Primary osteoarthritis, left ankle and foot: Secondary | ICD-10-CM

## 2023-09-13 DIAGNOSIS — G8929 Other chronic pain: Secondary | ICD-10-CM

## 2023-09-13 DIAGNOSIS — M7711 Lateral epicondylitis, right elbow: Secondary | ICD-10-CM

## 2023-09-13 DIAGNOSIS — M5030 Other cervical disc degeneration, unspecified cervical region: Secondary | ICD-10-CM

## 2023-09-13 DIAGNOSIS — E559 Vitamin D deficiency, unspecified: Secondary | ICD-10-CM

## 2023-09-13 DIAGNOSIS — Z5181 Encounter for therapeutic drug level monitoring: Secondary | ICD-10-CM | POA: Diagnosis not present

## 2023-09-13 DIAGNOSIS — M62838 Other muscle spasm: Secondary | ICD-10-CM

## 2023-09-13 DIAGNOSIS — M19041 Primary osteoarthritis, right hand: Secondary | ICD-10-CM

## 2023-09-13 DIAGNOSIS — M542 Cervicalgia: Secondary | ICD-10-CM

## 2023-09-13 DIAGNOSIS — G5701 Lesion of sciatic nerve, right lower limb: Secondary | ICD-10-CM

## 2023-09-13 DIAGNOSIS — M4126 Other idiopathic scoliosis, lumbar region: Secondary | ICD-10-CM

## 2023-09-13 NOTE — Patient Instructions (Addendum)
Cervical Strain and Sprain Rehab Ask your health care provider which exercises are safe for you. Do exercises exactly as told by your health care provider and adjust them as directed. It is normal to feel mild stretching, pulling, tightness, or discomfort as you do these exercises. Stop right away if you feel sudden pain or your pain gets worse. Do not begin these exercises until told by your health care provider. Stretching and range-of-motion exercises Cervical side bending  Using good posture, sit on a stable chair or stand up. Without moving your shoulders, slowly tilt your left / right ear to your shoulder until you feel a stretch in the neck muscles on the opposite side. You should be looking straight ahead. Hold for __________ seconds. Repeat with the other side of your neck. Repeat __________ times. Complete this exercise __________ times a day. Cervical rotation  Using good posture, sit on a stable chair or stand up. Slowly turn your head to the side as if you are looking over your left / right shoulder. Keep your eyes level with the ground. Stop when you feel a stretch along the side and the back of your neck. Hold for __________ seconds. Repeat this by turning to your other side. Repeat __________ times. Complete this exercise __________ times a day. Thoracic extension and pectoral stretch  Roll a towel or a small blanket so it is about 4 inches (10 cm) in diameter. Lie down on your back on a firm surface. Put the towel in the middle of your back across your spine. It should not be under your shoulder blades. Put your hands behind your head and let your elbows fall out to your sides. Hold for __________ seconds. Repeat __________ times. Complete this exercise __________ times a day. Strengthening exercises Upper cervical flexion  Lie on your back with a thin pillow behind your head or a small, rolled-up towel under your neck. Gently tuck your chin toward your chest and nod  your head down to look toward your feet. Do not lift your head off the pillow. Hold for __________ seconds. Release the tension slowly. Relax your neck muscles completely before you repeat this exercise. Repeat __________ times. Complete this exercise __________ times a day. Cervical extension  Stand about 6 inches (15 cm) away from a wall, with your back facing the wall. Place a soft object, about 6-8 inches (15-20 cm) in diameter, between the back of your head and the wall. A soft object could be a small pillow, a ball, or a folded towel. Gently tilt your head back and press into the soft object. Keep your jaw and forehead relaxed. Hold for __________ seconds. Release the tension slowly. Relax your neck muscles completely before you repeat this exercise. Repeat __________ times. Complete this exercise __________ times a day. Posture and body mechanics Body mechanics refer to the movements and positions of your body while you do your daily activities. Posture is part of body mechanics. Good posture and healthy body mechanics can help to relieve stress in your body's tissues and joints. Good posture means that your spine is in its natural S-curve position (your spine is neutral), your shoulders are pulled back slightly, and your head is not tipped forward. The following are general guidelines for using improved posture and body mechanics in your everyday activities. Sitting  When sitting, keep your spine neutral and keep your feet flat on the floor. Use a footrest, if needed, and keep your thighs parallel to the floor. Avoid rounding  your shoulders. Avoid tilting your head forward. When working at a desk or a computer, keep your desk at a height where your hands are slightly lower than your elbows. Slide your chair under your desk so you are close enough to maintain good posture. When working at a computer, place your monitor at a height where you are looking straight ahead and you do not have to  tilt your head forward or downward to look at the screen. Standing  When standing, keep your spine neutral and keep your feet about hip-width apart. Keep a slight bend in your knees. Your ears, shoulders, and hips should line up. When you do a task in which you stand in one place for a long time, place one foot up on a stable object that is 2-4 inches (5-10 cm) high, such as a footstool. This helps keep your spine neutral. Resting When lying down and resting, avoid positions that are most painful for you. Try to support your neck in a neutral position. You can use a contour pillow or a small rolled-up towel. Your pillow should support your neck but not push on it. This information is not intended to replace advice given to you by your health care provider. Make sure you discuss any questions you have with your health care provider. Document Revised: 04/19/2023 Document Reviewed: 07/06/2022 Elsevier Patient Education  2024 [employer] Hand Exercises Hand exercises can be helpful for almost anyone. They can strengthen your hands and improve flexibility and movement. The exercises can also increase blood flow to the hands. These results can make your work and daily tasks easier for you. Hand exercises can be especially helpful for people who have joint pain from arthritis or nerve damage from using their hands over and over. These exercises can also help people who injure a hand. Exercises Most of these hand exercises are gentle stretching and motion exercises. It is usually safe to do them often throughout the day. Warming up your hands before exercise may help reduce stiffness. You can do this with gentle massage or by placing your hands in warm water for 10-15 minutes. It is normal to feel some stretching, pulling, tightness, or mild discomfort when you begin new exercises. In time, this will improve. Remember to always be careful and stop right away if you feel sudden, very bad pain or your pain  gets worse. You want to get better and be safe. Ask your health care provider which exercises are safe for you. Do exercises exactly as told by your provider and adjust them as told. Do not begin these exercises until told by your provider. Knuckle bend or "claw" fist  Stand or sit with your arm, hand, and all five fingers pointed straight up. Make sure to keep your wrist straight. Gently bend your fingers down toward your palm until the tips of your fingers are touching your palm. Keep your big knuckle straight and only bend the small knuckles in your fingers. Hold this position for 10 seconds. Straighten your fingers back to your starting position. Repeat this exercise 5-10 times with each hand. Full finger fist  Stand or sit with your arm, hand, and all five fingers pointed straight up. Make sure to keep your wrist straight. Gently bend your fingers into your palm until the tips of your fingers are touching the middle of your palm. Hold this position for 10 seconds. Extend your fingers back to your starting position, stretching every joint fully. Repeat this exercise 5-10 times  with each hand. Straight fist  Stand or sit with your arm, hand, and all five fingers pointed straight up. Make sure to keep your wrist straight. Gently bend your fingers at the big knuckle, where your fingers meet your hand, and at the middle knuckle. Keep the knuckle at the tips of your fingers straight and try to touch the bottom of your palm. Hold this position for 10 seconds. Extend your fingers back to your starting position, stretching every joint fully. Repeat this exercise 5-10 times with each hand. Tabletop  Stand or sit with your arm, hand, and all five fingers pointed straight up. Make sure to keep your wrist straight. Gently bend your fingers at the big knuckle, where your fingers meet your hand, as far down as you can. Keep the small knuckles in your fingers straight. Think of forming a tabletop with  your fingers. Hold this position for 10 seconds. Extend your fingers back to your starting position, stretching every joint fully. Repeat this exercise 5-10 times with each hand. Finger spread  Place your hand flat on a table with your palm facing down. Make sure your wrist stays straight. Spread your fingers and thumb apart from each other as far as you can until you feel a gentle stretch. Hold this position for 10 seconds. Bring your fingers and thumb tight together again. Hold this position for 10 seconds. Repeat this exercise 5-10 times with each hand. Making circles  Stand or sit with your arm, hand, and all five fingers pointed straight up. Make sure to keep your wrist straight. Make a circle by touching the tip of your thumb to the tip of your index finger. Hold for 10 seconds. Then open your hand wide. Repeat this motion with your thumb and each of your fingers. Repeat this exercise 5-10 times with each hand. Thumb motion  Sit with your forearm resting on a table and your wrist straight. Your thumb should be facing up toward the ceiling. Keep your fingers relaxed as you move your thumb. Lift your thumb up as high as you can toward the ceiling. Hold for 10 seconds. Bend your thumb across your palm as far as you can, reaching the tip of your thumb for the small finger (pinkie) side of your palm. Hold for 10 seconds. Repeat this exercise 5-10 times with each hand. Grip strengthening  Hold a stress ball or other soft ball in the middle of your hand. Slowly increase the pressure, squeezing the ball as much as you can without causing pain. Think of bringing the tips of your fingers into the middle of your palm. All of your finger joints should bend when doing this exercise. Hold your squeeze for 10 seconds, then relax. Repeat this exercise 5-10 times with each hand. Contact a health care provider if: Your hand pain or discomfort gets much worse when you do an exercise. Your hand pain  or discomfort does not improve within 2 hours after you exercise. If you have either of these problems, stop doing these exercises right away. Do not do them again unless your provider says that you can. Get help right away if: You develop sudden, severe hand pain or swelling. If this happens, stop doing these exercises right away. Do not do them again unless your provider says that you can. This information is not intended to replace advice given to you by your health care provider. Make sure you discuss any questions you have with your health care provider. Document Revised: 12/29/2022  Document Reviewed: 12/29/2022 Elsevier Patient Education  2024 [employer].

## 2023-09-14 LAB — COMPLETE METABOLIC PANEL WITH GFR
AG Ratio: 2 (calc) (ref 1.0–2.5)
ALT: 14 U/L (ref 6–29)
AST: 22 U/L (ref 10–35)
Albumin: 4.4 g/dL (ref 3.6–5.1)
Alkaline phosphatase (APISO): 44 U/L (ref 37–153)
BUN: 16 mg/dL (ref 7–25)
CO2: 29 mmol/L (ref 20–32)
Calcium: 9.2 mg/dL (ref 8.6–10.4)
Chloride: 104 mmol/L (ref 98–110)
Creat: 0.84 mg/dL (ref 0.50–1.05)
Globulin: 2.2 g/dL (ref 1.9–3.7)
Glucose, Bld: 85 mg/dL (ref 65–99)
Potassium: 4.5 mmol/L (ref 3.5–5.3)
Sodium: 139 mmol/L (ref 135–146)
Total Bilirubin: 0.5 mg/dL (ref 0.2–1.2)
Total Protein: 6.6 g/dL (ref 6.1–8.1)
eGFR: 79 mL/min/{1.73_m2} (ref >=60)

## 2023-09-14 LAB — PHOSPHORUS: Phosphorus: 3.8 mg/dL (ref 2.5–4.5)

## 2023-09-14 NOTE — Progress Notes (Signed)
CMP normal, alkaline phosphatase is in normal range.  Phosphorus normal

## 2023-11-10 ENCOUNTER — Other Ambulatory Visit: Payer: Self-pay | Admitting: Rheumatology

## 2023-11-10 NOTE — Telephone Encounter (Signed)
Last Fill: 08/13/2023   Labs: 09/13/2023 CMP normal, alkaline phosphatase is in normal range.  Phosphorus normal   Next Visit: 03/24/2024  Last Visit: 09/13/2023  DX: Other osteoporosis without current pathological fracture   Current Dose per office note 09/13/2023: Fosamax 70 mg 1 tablet by mouth once weekly   Okay to refill Fosamax?

## 2024-01-27 ENCOUNTER — Other Ambulatory Visit: Payer: Self-pay | Admitting: Rheumatology

## 2024-01-27 NOTE — Telephone Encounter (Signed)
Last Fill: 11/10/2023  Labs: 09/13/2023 CMP normal, alkaline phosphatase is in normal range.  Phosphorus normal 03/19/2023 CBC normal  Next Visit: 03/24/2024  Last Visit: 09/14/2023   DX: Other osteoporosis without current pathological fracture   Current Dose per office note 09/13/2023: Fosamax 70 mg 1 tablet by mouth once weekly since December 2022.   Okay to refill Fosamax?

## 2024-03-10 NOTE — Progress Notes (Signed)
 Office Visit Note  Patient: Christine Ford             Date of Birth: [birth year]           MRN: [id number]             PCP: Willow Ora, MD Referring: Willow Ora, MD Visit Date: 03/24/2024 Occupation: @GUAROCC @  Subjective:  Medication management  History of Present Illness: Christine Ford is a 63 y.o. female with osteoporosis, osteoarthritis and sicca symptoms.  She returns today after last visit in September 2024.  She states she continues to have dry mouth and dry eye symptoms which are manageable with over-the-counter products.  She states she started working 2 times a week which has been easier on her neck and back.  She continues to have some neck stiffness and trapezius spasm.  She has intermittent discomfort in her lower back.  She has not had any radiculopathy.  Piriformis syndrome has resolved.  She has been walking and also doing stretching exercises.  She has been taking Fosamax 70 mg p.o. daily since December 2022 which she has been tolerating well.  She also takes calcium and vitamin D.    Activities of Daily Living:  Patient reports morning stiffness for a few minutes.   Patient Reports nocturnal pain.  Difficulty dressing/grooming: Denies Difficulty climbing stairs: Denies Difficulty getting out of chair: Denies Difficulty using hands for taps, buttons, cutlery, and/or writing: Denies  Review of Systems  Constitutional:  Negative for fatigue.  HENT:  Positive for mouth dryness and nose dryness. Negative for mouth sores.   Eyes:  Positive for dryness. Negative for pain.  Respiratory:  Negative for shortness of breath and difficulty breathing.   Cardiovascular:  Negative for chest pain and palpitations.  Gastrointestinal:  Negative for blood in stool, constipation and diarrhea.  Endocrine: Negative for increased urination.  Genitourinary:  Negative for involuntary urination.  Musculoskeletal:  Positive for joint pain, joint pain and morning stiffness.  Negative for gait problem, joint swelling, myalgias, muscle weakness, muscle tenderness and myalgias.  Skin:  Negative for color change, rash, hair loss and sensitivity to sunlight.  Allergic/Immunologic: Negative for susceptible to infections.  Neurological:  Negative for dizziness and headaches.  Hematological:  Negative for swollen glands.  Psychiatric/Behavioral:  Negative for depressed mood and sleep disturbance. The patient is not nervous/anxious.     PMFS History:  Patient Active Problem List   Diagnosis Date Noted   Subjective cognitive impairment 03/22/2023   Family history of first degree relative with dementia 03/22/2023   Abnormal CT of the chest 10/09/2022   Pulmonary nodule 01/09/2022   Myofascial pain syndrome 05/20/2021   Spondylosis without myelopathy or radiculopathy, lumbar region 05/20/2021   Sicca syndrome (HCC) 01/27/2021   Chronic eczematoid otitis externa of both ears 11/01/2020   Other idiopathic scoliosis, lumbar region 09/30/2018   DDD (degenerative disc disease), cervical 05/06/2018   Chronic left shoulder pain 05/06/2018   History of palpitations 07/29/2017   Osteoporosis 07/28/2017   Family history of premature CAD 07/28/2017   Primary insomnia 09/26/2008   GAD (generalized anxiety disorder) 09/28/2007   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    Past Medical History:  Diagnosis Date   Anxiety    Arthritis    Family history of dementia, mother, age > 52 07/28/2017   Osteopenia    Temporomandibular joint disorder (TMJ)     Family History  Problem Relation Age of Onset   Coronary artery disease Mother 62  Hypertension Mother    Osteoporosis Mother    COPD Mother    Alzheimer's disease Mother    Hypertension Father    Heart attack Father 16   Heart disease Father    Glaucoma Son    Healthy Son    Breast cancer Other    Lung cancer Other    Lung cancer Maternal Aunt    Heart disease Sister    Healthy Son    Lung cancer Cousin    Breast cancer Cousin     Past Surgical History:  Procedure Laterality Date   BREAST SURGERY     dental implant  2022   WISDOM TOOTH EXTRACTION     Social History   Social History Narrative   Occupation: Armed forces operational officer, 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  3 sons   Immunization History  Administered Date(s) Administered   Influenza,inj,Quad PF,6+ Mos 11/22/2018, 09/15/2019   Influenza-Unspecified 09/12/2020   PFIZER(Purple Top)SARS-COV-2 Vaccination 03/02/2020, 03/23/2020   PPD Test 10/22/2014, 11/05/2014   Td 12/03/2006   Tdap 12/19/2016   Zoster Recombinant(Shingrix) 03/07/2021, 08/07/2021     Objective: Vital Signs: BP (!) 149/85 (BP Location: Right Arm, Patient Position: Sitting, Cuff Size: Normal)   Pulse 64   Resp 14   Ht 5\' 4"  (1.626 m)   Wt 113 lb 9.6 oz (51.5 kg)   BMI 19.50 kg/m    Physical Exam Vitals and nursing note reviewed.  Constitutional:      Appearance: She is well-developed.  HENT:     Head: Normocephalic and atraumatic.  Eyes:     Conjunctiva/sclera: Conjunctivae normal.  Cardiovascular:     Rate and Rhythm: Normal rate and regular rhythm.     Heart sounds: Normal heart sounds.  Pulmonary:     Effort: Pulmonary effort is normal.     Breath sounds: Normal breath sounds.  Abdominal:     General: Bowel sounds are normal.     Palpations: Abdomen is soft.  Musculoskeletal:     Cervical back: Normal range of motion.  Lymphadenopathy:     Cervical: No cervical adenopathy.  Skin:    General: Skin is warm and dry.     Capillary Refill: Capillary refill takes less than 2 seconds.  Neurological:     Mental Status: She is alert and oriented to person, place, and time.  Psychiatric:        Behavior: Behavior normal.      Musculoskeletal Exam: Cervical spine has some limitation with lateral rotation.  Some of the stretching exercises were demonstrated in the office.  Thoracic and lumbar spine 1 good range of motion.  Shoulders, elbows, wrist, MCPs PIPs  and DIPs were in good range of motion.  She had very mild PIP and DIP prominence.  Hip joints and knee joints in good range of motion.  She had tenderness over bilateral first MTP joints.  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  No synovitis was noted.  CDAI Exam: CDAI Score: -- Patient Global: --; Provider Global: -- Swollen: --; Tender: -- Joint Exam 03/24/2024   No joint exam has been documented for this visit   There is currently no information documented on the homunculus. Go to the Rheumatology activity and complete the homunculus joint exam.  Investigation: No additional findings.  Imaging: No results found.  Recent Labs: Lab Results  Component Value Date   WBC 4.6 03/19/2023   HGB 13.9 03/19/2023   PLT 242.0 03/19/2023   NA 139 09/13/2023  K 4.5 09/13/2023   CL 104 09/13/2023   CO2 29 09/13/2023   GLUCOSE 85 09/13/2023   BUN 16 09/13/2023   CREATININE 0.84 09/13/2023   BILITOT 0.5 09/13/2023   ALKPHOS 35 (L) 03/19/2023   AST 22 09/13/2023   ALT 14 09/13/2023   PROT 6.6 09/13/2023   ALBUMIN 4.7 03/19/2023   CALCIUM 9.2 09/13/2023   GFRAA 98 04/04/2018    Speciality Comments: Fosamax started December 2022  DEXA at [facility]  Procedures:  No procedures performed Allergies: Patient has no known allergies.   Assessment / Plan:     Visit Diagnoses: Other osteoporosis without current pathological fracture - April 02, 2023 DEXA scan T-score -2.4 right femoral neck, T-score -2.4 left femoral neck BMD change 3.2%.  She has been on Fosamax 70 mg p.o. weekly since December 2022.  Use of calcium rich diet and vitamin D was discussed.  Regular exercise was also emphasized.  Will repeat DEXA scan in 2026.  Medication monitoring encounter - Fosamax 70 mg 1 tablet by mouth once weekly since December 2022.  Patient had CBC, CMP with her PCP this morning.  Vitamin D deficiency-her vitamin D was low at 1 time but has been stable since then.  She has been taking vitamin D 1000  units daily.  Will recheck next year.  Sicca syndrome (HCC) - Positive ANA, negative Ro, negative Lyme, negative RF.  Family history of Sjogren's: mother.  She has been using over-the-counter products which has been helpful.  Primary osteoarthritis of both hands-she had mild PIP and DIP thickening without synovitis.  Joint protection was discussed.  Primary osteoarthritis of both feet-she has been in some discomfort over bilateral first MTP joint where she had hallux rigidus.  Proper fitting shoes with cushioned socks were discussed.  Trapezius muscle spasm - Trigger point injections performed in October 2022.  She continues to have some trapezius muscle spasm.  Stretching exercises were discussed.  DDD (degenerative disc disease), cervical -she had limited lateral rotation of the cervical spine.  Range of motion exercises were demonstrated in the office.  MRI Cervical Spine performed on 04/05/2023.  Patient was evaluated by Dr. Cheree Ford.  Her neck pain has improved since she has cut back on her work.  Other idiopathic scoliosis, lumbar region-mild riskwas noted.  Chronic right SI joint pain - Previously followed by [facility].  Piriformis syndrome of right side-resolved.  Orders: No orders of the defined types were placed in this encounter.  No orders of the defined types were placed in this encounter.   Follow-Up Instructions: Return in about 6 months (around 09/24/2024) for Osteoporosis, Osteoarthritis.   Pollyann Savoy, MD  Note - This record has been created using Animal nutritionist.  Chart creation errors have been sought, but may not always  have been located. Such creation errors do not reflect on  the standard of medical care.

## 2024-03-13 ENCOUNTER — Ambulatory Visit: Payer: BC Managed Care – PPO | Admitting: Physician Assistant

## 2024-03-14 ENCOUNTER — Encounter: Payer: Self-pay | Admitting: Emergency Medicine

## 2024-03-24 ENCOUNTER — Ambulatory Visit: Payer: BC Managed Care – PPO | Admitting: Family Medicine

## 2024-03-24 ENCOUNTER — Encounter: Payer: Self-pay | Admitting: Rheumatology

## 2024-03-24 ENCOUNTER — Ambulatory Visit: Payer: BC Managed Care – PPO | Attending: Physician Assistant | Admitting: Rheumatology

## 2024-03-24 ENCOUNTER — Encounter: Payer: Self-pay | Admitting: Family Medicine

## 2024-03-24 VITALS — BP 132/77 | HR 62 | Resp 14 | Ht 64.0 in | Wt 113.6 lb

## 2024-03-24 VITALS — BP 105/66 | HR 62 | Temp 97.7°F | Ht 64.0 in | Wt 114.0 lb

## 2024-03-24 DIAGNOSIS — M818 Other osteoporosis without current pathological fracture: Secondary | ICD-10-CM

## 2024-03-24 DIAGNOSIS — M533 Sacrococcygeal disorders, not elsewhere classified: Secondary | ICD-10-CM

## 2024-03-24 DIAGNOSIS — E559 Vitamin D deficiency, unspecified: Secondary | ICD-10-CM

## 2024-03-24 DIAGNOSIS — Z0000 Encounter for general adult medical examination without abnormal findings: Secondary | ICD-10-CM

## 2024-03-24 DIAGNOSIS — Z5181 Encounter for therapeutic drug level monitoring: Secondary | ICD-10-CM | POA: Diagnosis not present

## 2024-03-24 DIAGNOSIS — M3500 Sicca syndrome, unspecified: Secondary | ICD-10-CM

## 2024-03-24 DIAGNOSIS — F411 Generalized anxiety disorder: Secondary | ICD-10-CM | POA: Diagnosis not present

## 2024-03-24 DIAGNOSIS — G8929 Other chronic pain: Secondary | ICD-10-CM

## 2024-03-24 DIAGNOSIS — M542 Cervicalgia: Secondary | ICD-10-CM

## 2024-03-24 DIAGNOSIS — M5030 Other cervical disc degeneration, unspecified cervical region: Secondary | ICD-10-CM

## 2024-03-24 DIAGNOSIS — M4126 Other idiopathic scoliosis, lumbar region: Secondary | ICD-10-CM

## 2024-03-24 DIAGNOSIS — M19071 Primary osteoarthritis, right ankle and foot: Secondary | ICD-10-CM

## 2024-03-24 DIAGNOSIS — M19072 Primary osteoarthritis, left ankle and foot: Secondary | ICD-10-CM

## 2024-03-24 DIAGNOSIS — M19042 Primary osteoarthritis, left hand: Secondary | ICD-10-CM

## 2024-03-24 DIAGNOSIS — M62838 Other muscle spasm: Secondary | ICD-10-CM

## 2024-03-24 DIAGNOSIS — G5701 Lesion of sciatic nerve, right lower limb: Secondary | ICD-10-CM

## 2024-03-24 DIAGNOSIS — M19041 Primary osteoarthritis, right hand: Secondary | ICD-10-CM

## 2024-03-24 LAB — CBC WITH DIFFERENTIAL/PLATELET
Basophils Absolute: 0 10*3/uL (ref 0.0–0.1)
Basophils Relative: 1.1 % (ref 0.0–3.0)
Eosinophils Absolute: 0.1 10*3/uL (ref 0.0–0.7)
Eosinophils Relative: 2.2 % (ref 0.0–5.0)
HCT: 40 % (ref 36.0–46.0)
Hemoglobin: 13.7 g/dL (ref 12.0–15.0)
Lymphocytes Relative: 29.6 % (ref 12.0–46.0)
Lymphs Abs: 1.2 10*3/uL (ref 0.7–4.0)
MCHC: 34.3 g/dL (ref 30.0–36.0)
MCV: 95.7 fl (ref 78.0–100.0)
Monocytes Absolute: 0.4 10*3/uL (ref 0.1–1.0)
Monocytes Relative: 10 % (ref 3.0–12.0)
Neutro Abs: 2.4 10*3/uL (ref 1.4–7.7)
Neutrophils Relative %: 57.1 % (ref 43.0–77.0)
Platelets: 242 10*3/uL (ref 150.0–400.0)
RBC: 4.18 Mil/uL (ref 3.87–5.11)
RDW: 12.5 % (ref 11.5–15.5)
WBC: 4.1 10*3/uL (ref 4.0–10.5)

## 2024-03-24 LAB — LIPID PANEL
Cholesterol: 193 mg/dL (ref 0–200)
HDL: 65.1 mg/dL (ref >39.00)
LDL Cholesterol: 109 mg/dL — ABNORMAL HIGH (ref 0–99)
NonHDL: 127.53
Total CHOL/HDL Ratio: 3
Triglycerides: 93 mg/dL (ref 0.0–149.0)
VLDL: 18.6 mg/dL (ref 0.0–40.0)

## 2024-03-24 LAB — COMPREHENSIVE METABOLIC PANEL WITH GFR
ALT: 14 U/L (ref 0–35)
AST: 22 U/L (ref 0–37)
Albumin: 4.5 g/dL (ref 3.5–5.2)
Alkaline Phosphatase: 33 U/L — ABNORMAL LOW (ref 39–117)
BUN: 14 mg/dL (ref 6–23)
CO2: 29 meq/L (ref 19–32)
Calcium: 8.8 mg/dL (ref 8.4–10.5)
Chloride: 104 meq/L (ref 96–112)
Creatinine, Ser: 0.8 mg/dL (ref 0.40–1.20)
GFR: 78.84 mL/min (ref >60.00)
Glucose, Bld: 87 mg/dL (ref 70–99)
Potassium: 3.9 meq/L (ref 3.5–5.1)
Sodium: 140 meq/L (ref 135–145)
Total Bilirubin: 0.7 mg/dL (ref 0.2–1.2)
Total Protein: 7 g/dL (ref 6.0–8.3)

## 2024-03-24 LAB — TSH: TSH: 3.15 u[IU]/mL (ref 0.35–5.50)

## 2024-03-24 NOTE — Patient Instructions (Signed)
 Please return in 12 months for your annual complete physical; please come fasting.   I will release your lab results to you on your MyChart account with further instructions. You may see the results before I do, but when I review them I will send you a message with my report or have my assistant call you if things need to be discussed. Please reply to my message with any questions. Thank you!   If you have any questions or concerns, please don't hesitate to send me a message via MyChart or call the office at [phone]. Thank you for visiting with [place] today! It's our pleasure caring for you.   VISIT SUMMARY:  Today, you came in for your annual physical exam and to discuss your anxiety medication. You mentioned a persistent itchy spot on your back and shared your family history of dementia. Overall, you are feeling well and managing your anxiety effectively with your current medication.  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 You should apply Vaseline to the scabbed area and monitor it for any changes. If it does not improve or if you notice any concerning changes, we may refer you to a dermatologist.  -GENERALIZED ANXIETY DISORDER: Your generalized anxiety disorder is well-managed with Wellbutrin, and you have shown significant improvement. We will renew your Wellbutrin prescription for one year. Please follow up if you experience any issues.  -GENERAL HEALTH MAINTENANCE: You are due for a Pap smear and a mammogram. Please schedule a Pap smear with your gynecologist and request the results. Also, call to get your mammogram results. Continue your follow-up with the rheumatologist for your Fosamax treatment.  INSTRUCTIONS:  Please schedule a Pap smear with your gynecologist and call to get your mammogram results. Continue to follow up with your rheumatologist  for your Fosamax treatment. If the itchy spot on your back does not improve or if you notice any concerning changes, consider seeing a dermatologist.

## 2024-03-24 NOTE — Progress Notes (Signed)
 Subjective  Chief Complaint  Patient presents with   Annual Exam    Pt here for Annual Exam and is currently fasting     HPI: Christine Ford is a 63 y.o. female who presents to [facility] [place] today for a Female Wellness Visit. She also has the concerns and/or needs as listed above in the chief complaint. These will be addressed in addition to the Health Maintenance Visit.   Wellness Visit: annual visit with health maintenance review and exam  HM: pt sees Dr. Senaida Ores for gyn care: due exam and mammo. Will schedule. Reports pap is current. CRC is current. Due this year. Pt to schedule female wellness exam. She is eating healthy and exercising. Feeling well Chronic disease f/u and/or acute problem visit: (deemed necessary to be done in addition to the wellness visit): H/o cognitive concerns: saw neuro.. never completed neuropsych eval due to inappropriate interaction with MD and did not get brain MRI because she is doing better. Life is less stressful as well.  Anxiety is well managed on wellbutrin. Tolerating fosamax for osteoporosis. F/u dexa due next year Sees Dr. Corliss Skains for her sicca syndrome. Stable.   Assessment  1. Annual physical exam   2. GAD (generalized anxiety disorder)   3. Sicca syndrome (HCC)   4. Other osteoporosis without current pathological fracture      Plan  Female Wellness Visit: Age appropriate Health Maintenance and Prevention measures were discussed with patient. Included topics are cancer screening recommendations, ways to keep healthy (see AVS) including dietary and exercise recommendations, regular eye and dental care, use of seat belts, and avoidance of moderate alcohol use and tobacco use.  BMI: discussed patient's BMI and encouraged positive lifestyle modifications to help get to or maintain a target BMI. HM needs and immunizations were addressed and ordered. See below for orders. See HM and immunization section for  updates. Routine labs and screening tests ordered including cmp, cbc and lipids where appropriate. Discussed recommendations regarding Vit D and calcium supplementation (see AVS)  Chronic disease management visit and/or acute problem visit: GAD on wellbutrin is well controlled. Continue medications.  Cog concerns have resolved.  F/u with rheum.  Continue fosamax for osteoporosis. Per Dr. Corliss Skains managing.   Follow up: 12 mo for cpe  Orders Placed This Encounter  Procedures   CBC with Differential/Platelet   Comprehensive metabolic panel with GFR   Lipid panel   TSH   No orders of the defined types were placed in this encounter.     Body mass index is 19.57 kg/m. Wt Readings from Last 3 Encounters:  03/24/24 113 lb 9.6 oz (51.5 kg)  03/24/24 114 lb (51.7 kg)  09/13/23 114 lb (51.7 kg)     Patient Active Problem List   Diagnosis Date Noted Date Diagnosed   Subjective cognitive impairment 03/22/2023     Priority: High    Evaluated by Dr. Deitra Mayo 2024, MMSE 28/30, MoCA 29;  ATN alzheimers markers screen negative. Brain MRI July 2024:  Neuropsychological evaluation ordered.  + FH of alzheimers    Spondylosis without myelopathy or radiculopathy, lumbar region 05/20/2021     Priority: High   DDD (degenerative disc disease), cervical 05/06/2018     Priority: High    Multilevel spondylosis was noted with C4-5, C5-6, C6-7 narrowing.   Anterior spurring and posterior spurring was noted.  Facet joint arthropathy was noted.      Osteoporosis 07/28/2017     Priority: High  Managed by rheum. Dr. Corliss Skains Last DEXA 2022: T = - 2.6 lowest. On fosamax Dexa 03/2023: Results: on fosamax   Lumbar spine L1-L4 Femoral neck (FN) Total Hip (TH)  T-score   -1.9 RFN: -2.4 LFN: -2.4 RTH: -2.3 LTH: -2.5  Change in BMD from previous DXA test (%) Up 4.1%* Up 3.2% n/a  (*) statistically significant  Original dx by [facility].     Primary insomnia 09/26/2008     Priority: High     Previous evaluation by sleep medicine.  Thought to be due to psychophysiologic insomnia, however also consistent with TMJ and anxiety.  CBT was recommended as well as follow-up with dentist.    GAD (generalized anxiety disorder) 09/28/2007     Priority: High    Was on lexapro 10 2020-2023; stopped due to GI dysfunction.  Stressful home life/marriage. Has been to counseling.  Started wellbutrin 02/2023    Sicca syndrome (HCC) 01/27/2021     Priority: Medium     She has chronic sicca symptoms. +ANA 1:160 NS, -Ro,-LA, -RF. Her mother has history of Sjogren's syndrome.    Chronic left shoulder pain 05/06/2018     Priority: Medium    Family history of premature CAD 07/28/2017     Priority: Medium     Dad passed age     Pulmonary nodule 01/09/2022     Priority: Low    6 mm likely benign found incidentally on chest CT screening January 2020.  Recommend follow-up in 12 months. F/u 05/2022: multiple benign appearing nodules. 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 Dr. Delton Coombes    Myofascial pain syndrome 05/20/2021    Health Maintenance  Topic Date Due   MAMMOGRAM  02/20/2023   COVID-19 Vaccine (3 - 2024-25 season) 04/09/2024 (Originally 08/29/2023)   Cervical Cancer Screening (HPV/Pap Cotest)  10/19/2024   DEXA SCAN  04/01/2025   DTaP/Tdap/Td (3 - Td or Tdap) 12/19/2026   Colonoscopy  05/17/2031   Hepatitis C Screening  Completed   HIV Screening  Completed   Zoster Vaccines- Shingrix  Completed   HPV VACCINES  Aged Out   INFLUENZA VACCINE   Discontinued   Immunization History  Administered Date(s) Administered   Influenza,inj,Quad PF,6+ Mos 11/22/2018, 09/15/2019   Influenza-Unspecified 09/12/2020   PFIZER(Purple Top)SARS-COV-2 Vaccination 03/02/2020, 03/23/2020   PPD Test 10/22/2014, 11/05/2014   Td 12/03/2006   Tdap 12/19/2016   Zoster Recombinant(Shingrix) 03/07/2021, 08/07/2021   We updated and reviewed the patient's past history in detail and it is documented below. Allergies: Patient has no known allergies. Past Medical History Patient  has a past medical history of Anxiety, Arthritis, Family history of dementia, mother, age > 42 (07/28/2017), Osteopenia, and Temporomandibular joint disorder (TMJ). Past Surgical History Patient  has a past surgical history that includes Breast surgery; Wisdom tooth extraction; and dental implant (2022). Family History: Patient family history includes Alzheimer's disease in her mother; Breast cancer in her cousin and another family member; COPD in  her mother; Coronary artery disease (age of onset: 20) in her mother; Glaucoma in her son; Healthy in her son and son; Heart attack (age of onset: 37) in her father; Heart disease in her father and sister; Hypertension in her father and mother; Lung cancer in her cousin, maternal aunt, and another family member; Osteoporosis in her mother. Social History:  Patient  reports that she has never smoked. She has been exposed to tobacco smoke. She has never used smokeless tobacco. She reports current alcohol use. She reports that she does not use drugs.  Review of Systems: Constitutional: negative for fever or malaise Ophthalmic: negative for photophobia, double vision or loss of vision Cardiovascular: negative for chest pain, dyspnea on exertion, or new LE swelling Respiratory: negative for SOB or persistent cough Gastrointestinal: negative for abdominal pain, change in bowel habits or melena Genitourinary: negative for dysuria or gross hematuria,  no abnormal uterine bleeding or disharge Musculoskeletal: negative for new gait disturbance or muscular weakness Integumentary: negative for new or persistent rashes, no breast lumps Neurological: negative for TIA or stroke symptoms Psychiatric: negative for SI or delusions Allergic/Immunologic: negative for hives  Patient Care Team    Relationship Specialty Notifications Start End  Willow Ora, MD PCP - General Family Medicine  01/27/21   Huel Cote, MD Consulting Physician Obstetrics and Gynecology  11/23/11   Pollyann Savoy, MD Consulting Physician Rheumatology  07/28/19   Ermalinda Barrios, MD Attending Physician Otolaryngology  03/07/21   Leslye Peer, MD Consulting Physician Pulmonary Disease  03/19/23     Objective  Vitals: BP 105/66   Pulse 62   Temp 97.7 F (36.5 C)   Ht 5\' 4"  (1.626 m)   Wt 114 lb (51.7 kg)   SpO2 100%   BMI 19.57 kg/m  General:  Well developed, well nourished, no acute distress  Psych:  Alert and orientedx3,normal mood and affect HEENT:  Normocephalic, atraumatic, non-icteric sclera,  supple neck without adenopathy, mass or thyromegaly Cardiovascular:  Normal S1, S2, RRR without gallop, rub or murmur Respiratory:  Good breath sounds bilaterally, CTAB with normal respiratory effort Gastrointestinal: normal bowel sounds, soft, non-tender, no noted masses. No HSM MSK: extremities without edema, joints without erythema or swelling Neurologic:    Mental status is normal.  Gross motor and sensory exams are normal.  No tremor  Commons side effects, risks, benefits, and alternatives for medications and treatment plan prescribed today were discussed, and the patient expressed understanding of the given instructions. Patient is instructed to call or message via MyChart if he/she has any questions or concerns regarding our treatment plan. No barriers to understanding were identified. We discussed Red Flag symptoms and signs in detail. Patient expressed  understanding regarding what to do in case of urgent or emergency type symptoms.  Medication list was reconciled, printed and provided to the patient in AVS. Patient instructions and summary information was reviewed with the patient as documented in the AVS. This note was prepared with assistance of Dragon voice recognition software. Occasional wrong-word or sound-a-like substitutions may have occurred due to the inherent limitations of voice recognition software

## 2024-03-25 ENCOUNTER — Encounter: Payer: Self-pay | Admitting: Family Medicine

## 2024-03-26 ENCOUNTER — Other Ambulatory Visit: Payer: Self-pay | Admitting: Rheumatology

## 2024-03-27 ENCOUNTER — Encounter: Payer: Self-pay | Admitting: Family Medicine

## 2024-03-27 DIAGNOSIS — Z8249 Family history of ischemic heart disease and other diseases of the circulatory system: Secondary | ICD-10-CM

## 2024-03-27 DIAGNOSIS — E7841 Elevated Lipoprotein(a): Secondary | ICD-10-CM

## 2024-03-27 NOTE — Progress Notes (Signed)
 Labs reviewed.  The 10-year ASCVD risk score (Arnett DK, et al., 2019) is: 3.8%   Values used to calculate the score:     Age: 63 years     Sex: Female     Is Non-Hispanic African American: No     Diabetic: No     Tobacco smoker: No     Systolic Blood Pressure: 132 mmHg     Is BP treated: No     HDL Cholesterol: 65.1 mg/dL     Total Cholesterol: 193 mg/dL

## 2024-03-27 NOTE — Telephone Encounter (Signed)
 See note

## 2024-03-27 NOTE — Telephone Encounter (Signed)
**Note De-identified  [place] Obfuscation** Please advise 

## 2024-04-17 ENCOUNTER — Other Ambulatory Visit

## 2024-04-17 DIAGNOSIS — Z8249 Family history of ischemic heart disease and other diseases of the circulatory system: Secondary | ICD-10-CM

## 2024-04-20 LAB — LIPOPROTEIN A (LPA): Lipoprotein (a): 145 nmol/L — ABNORMAL HIGH (ref <75)

## 2024-04-21 ENCOUNTER — Other Ambulatory Visit: Payer: Self-pay | Admitting: Rheumatology

## 2024-04-24 NOTE — Addendum Note (Signed)
 Addended by: Karma Oz on: 04/24/2024 01:00 PM   Modules accepted: Orders

## 2024-04-24 NOTE — Telephone Encounter (Signed)
 Last Fill: 01/27/2024  Labs: 03/24/2024 Alk. Phos 33  Next Visit: 03/23/2025  Last Visit: 03/24/2024  DX:  Other osteoporosis without current pathological fracture   Current Dose per office note 03/24/2024: Fosamax  70 mg 1 tablet by mouth once weekly   Okay to refill Fosamax ?

## 2024-05-04 DIAGNOSIS — M3501 Sicca syndrome with keratoconjunctivitis: Secondary | ICD-10-CM | POA: Diagnosis not present

## 2024-06-26 ENCOUNTER — Other Ambulatory Visit: Payer: Self-pay | Admitting: Family Medicine

## 2024-06-26 NOTE — Telephone Encounter (Unsigned)
 Copied from CRM [id number]. Topic: Clinical - Medication Refill >> Jun 26, 2024  2:57 PM Chiquita SQUIBB wrote: Medication: buPROPion  (WELLBUTRIN  XL) 150 MG 24 hr tablet [[id number]]  Has the patient contacted their pharmacy? Yes- Pharmacy calling in (Agent: If no, request that the patient contact the pharmacy for the refill. If patient does not wish to contact the pharmacy document the reason why and proceed with request.) (Agent: If yes, when and what did the pharmacy advise?)  This is the patient's preferred pharmacy:  [id number] - [place], [place] - [address] [place] [address] Phone: [phone] Fax: [phone]  Is this the correct pharmacy for this prescription? Yes If no, delete pharmacy and type the correct one.   Has the prescription been filled recently? No  Is the patient out of the medication? Yes  Has the patient been seen for an appointment in the last year OR does the patient have an upcoming appointment? Yes  Can we respond through MyChart? No  Agent: Please be advised that Rx refills may take up to 3 business days. We ask that you follow-up with your pharmacy.

## 2024-06-27 ENCOUNTER — Other Ambulatory Visit: Payer: Self-pay

## 2024-06-27 ENCOUNTER — Telehealth: Payer: Self-pay

## 2024-06-27 MED ORDER — BUPROPION HCL ER (XL) 150 MG PO TB24: 150.0000 mg | ORAL_TABLET | Freq: Every day | ORAL | 3 refills | Status: AC

## 2024-06-27 MED ORDER — BUPROPION HCL ER (XL) 150 MG PO TB24: 150.0000 mg | ORAL_TABLET | Freq: Every day | ORAL | 3 refills | Status: DC

## 2024-06-27 NOTE — Telephone Encounter (Signed)
 Copied from CRM [id number]. Topic: Clinical - Medication Refill >> Jun 26, 2024  2:57 PM Christine Ford wrote: Medication: buPROPion  (WELLBUTRIN  XL) 150 MG 24 hr tablet [[id number]]  Has the patient contacted their pharmacy? Yes- Pharmacy calling in (Agent: If no, request that the patient contact the pharmacy for the refill. If patient does not wish to contact the pharmacy document the reason why and proceed with request.) (Agent: If yes, when and what did the pharmacy advise?)  This is the patient's preferred pharmacy:  [id number] - [place],  - [address] [place] [address] Phone: [phone] Fax: [phone]  Is this the correct pharmacy for this prescription? Yes If no, delete pharmacy and type the correct one.   Has the prescription been filled recently? No  Is the patient out of the medication? Yes  Has the patient been seen for an appointment in the last year OR does the patient have an upcoming appointment? Yes  Can we respond through MyChart? No  Agent: Please be advised that Rx refills may take up to 3 business days. We ask that you follow-up with your pharmacy. >> Jun 26, 2024  3:07 PM Christine Ford wrote: Patient is calling to follow up on the refill request for buPROPion  (WELLBUTRIN  XL) 150 MG 24 hr tablet [[id number]] , the pharmacy advised the patient they've made multiple attempts and have not heard back.   Rx sent

## 2024-06-27 NOTE — Telephone Encounter (Signed)
 Copied from CRM [id number]. Topic: Clinical - Medication Refill >> Jun 26, 2024  2:57 PM Chiquita SQUIBB wrote: Medication: buPROPion  (WELLBUTRIN  XL) 150 MG 24 hr tablet [[id number]]  Has the patient contacted their pharmacy? Yes- Pharmacy calling in (Agent: If no, request that the patient contact the pharmacy for the refill. If patient does not wish to contact the pharmacy document the reason why and proceed with request.) (Agent: If yes, when and what did the pharmacy advise?)  This is the patient's preferred pharmacy:  [id number] - [place], [place] - [address] [place] [address] Phone: [phone] Fax: [phone]  Is this the correct pharmacy for this prescription? Yes If no, delete pharmacy and type the correct one.   Has the prescription been filled recently? No  Is the patient out of the medication? Yes  Has the patient been seen for an appointment in the last year OR does the patient have an upcoming appointment? Yes  Can we respond through MyChart? No  Agent: Please be advised that Rx refills may take up to 3 business days. We ask that you follow-up with your pharmacy. >> Jun 26, 2024  3:07 PM Franky GRADE wrote: Patient is calling to follow up on the refill request for buPROPion  (WELLBUTRIN  XL) 150 MG 24 hr tablet [[id number]] , the pharmacy advised the patient they've made multiple attempts and have not heard back.   Rx has been sent

## 2024-07-14 ENCOUNTER — Other Ambulatory Visit: Payer: Self-pay | Admitting: Rheumatology

## 2024-07-14 NOTE — Telephone Encounter (Signed)
 Last Fill: 04/24/2024  Labs: 03/24/2024 Alk. Phos 33  Next Visit: 03/23/2025  Last Visit: 03/24/2024  DX: Other osteoporosis without current pathological fracture   Current Dose per office note 03/24/2024: Fosamax  70 mg 1 tablet by mouth once weekly   Okay to refill Fosamax ?

## 2024-08-14 DIAGNOSIS — H6123 Impacted cerumen, bilateral: Secondary | ICD-10-CM | POA: Diagnosis not present

## 2024-08-14 DIAGNOSIS — H938X3 Other specified disorders of ear, bilateral: Secondary | ICD-10-CM | POA: Diagnosis not present

## 2024-08-14 DIAGNOSIS — H6122 Impacted cerumen, left ear: Secondary | ICD-10-CM | POA: Diagnosis not present

## 2024-08-17 DIAGNOSIS — R87810 Cervical high risk human papillomavirus (HPV) DNA test positive: Secondary | ICD-10-CM | POA: Diagnosis not present

## 2024-08-17 DIAGNOSIS — Z1231 Encounter for screening mammogram for malignant neoplasm of breast: Secondary | ICD-10-CM | POA: Diagnosis not present

## 2024-08-17 DIAGNOSIS — Z130 Encounter for screening for diseases of the blood and blood-forming organs and certain disorders involving the immune mechanism: Secondary | ICD-10-CM | POA: Diagnosis not present

## 2024-08-17 DIAGNOSIS — Z01419 Encounter for gynecological examination (general) (routine) without abnormal findings: Secondary | ICD-10-CM | POA: Diagnosis not present

## 2024-08-24 DIAGNOSIS — B078 Other viral warts: Secondary | ICD-10-CM | POA: Diagnosis not present

## 2024-09-04 ENCOUNTER — Encounter: Payer: Self-pay | Admitting: Internal Medicine

## 2024-09-04 ENCOUNTER — Ambulatory Visit: Attending: Internal Medicine | Admitting: Internal Medicine

## 2024-09-04 VITALS — BP 122/72 | HR 63 | Ht 64.0 in | Wt 111.6 lb

## 2024-09-04 DIAGNOSIS — I2510 Atherosclerotic heart disease of native coronary artery without angina pectoris: Secondary | ICD-10-CM

## 2024-09-04 DIAGNOSIS — Z8249 Family history of ischemic heart disease and other diseases of the circulatory system: Secondary | ICD-10-CM

## 2024-09-04 DIAGNOSIS — E7841 Elevated Lipoprotein(a): Secondary | ICD-10-CM

## 2024-09-04 DIAGNOSIS — R079 Chest pain, unspecified: Secondary | ICD-10-CM

## 2024-09-04 MED ORDER — METOPROLOL TARTRATE 25 MG PO TABS: 25.0000 mg | ORAL_TABLET | ORAL | 0 refills | Status: AC

## 2024-09-04 NOTE — Progress Notes (Signed)
 LIPID CLINIC CONSULT NOTE  Chief Complaint:  Elevated LP(a)  Primary Care Physician: Jodie Lavern CROME, MD  Primary Cardiologist:  None  HPI:  Echo Propp is a 63 y.o. female who is being seen today for the evaluation of elevated LP(a) at the request of Jodie Lavern CROME, MD. this is a pleasant 63 year old female kindly referred for evaluation and management of cardiovascular risk as well as elevated LP(a).  She reports a strong family history of heart disease including her father who died in his late 50s but had bypass surgery at age 46 and heart attacks prior to that.  There are also other family members with heart disease including his mother.  Ms. Galyon notes that she has always had somewhat elevated cholesterol despite eating a low-fat diet, exercising regularly and previously being an avid runner.  She asked to have testing of LP(a) which was found to be elevated at 145 nmol/L.  Recent lipid profile in March 2025 showed total cholesterol 193, triglycerides 93, HDL 65 and LDL 109.  She has not been on any lipid-lowering therapies.  Today she reports that last week she had an episode where she was shopping at [employer] and she had onset of squeezing substernal chest pressure.  She said it felt like food was getting stuck in her chest.  She debated as to whether or not to present to the hospital but she ate some yogurt which eventually provided some relief.  EKG personally reviewed today shows sinus rhythm with an incomplete right bundle branch block.  No clear EKG was available to compare.  Besides her family history and elevated LP(a) she has few traditional risk factors, including no history of hypertension, diabetes, tobacco use or other traditional risk factors.  PMHx:  Past Medical History:  Diagnosis Date   Anxiety    Arthritis    Family history of dementia, mother, age > 66 07/28/2017   Osteopenia    Temporomandibular joint disorder (TMJ)     Past Surgical History:   Procedure Laterality Date   BREAST SURGERY     dental implant  2022   WISDOM TOOTH EXTRACTION      FAMHx:  Family History  Problem Relation Age of Onset   Coronary artery disease Mother 64   Hypertension Mother    Osteoporosis Mother    COPD Mother    Alzheimer's disease Mother    Hypertension Father    Heart attack Father 35   Heart disease Father    Glaucoma Son    Healthy Son    Breast cancer Other    Lung cancer Other    Lung cancer Maternal Aunt    Heart disease Sister    Healthy Son    Lung cancer Cousin    Breast cancer Cousin     SOCHx:   reports that she has never smoked. She has been exposed to tobacco smoke. She has never used smokeless tobacco. She reports current alcohol use. She reports that she does not use drugs.  ALLERGIES:  No Known Allergies  ROS: Pertinent items noted in HPI and remainder of comprehensive ROS otherwise negative.  HOME MEDS: Current Outpatient Medications on File Prior to Visit  Medication Sig Dispense Refill   alendronate  (FOSAMAX ) 70 MG tablet TAKE 1 TABLET(70 MG) BY MOUTH 1 TIME A WEEK WITH A FULL GLASS OF WATER AND ON AN EMPTY STOMACH 12 tablet 0   buPROPion  (WELLBUTRIN  XL) 150 MG 24 hr tablet Take 1 tablet (150 mg total) by  mouth daily. 90 tablet 3   Cholecalciferol (VITAMIN D3) 1000 units CAPS      Multiple Vitamins-Minerals (MULTIVITAMIN,TX-MINERALS) tablet Take 1 tablet by mouth daily.     No current facility-administered medications on file prior to visit.    LABS/IMAGING: No results found for this or any previous visit (from the past 48 hours). No results found.  LIPID PANEL:    Component Value Date/Time   CHOL 193 03/24/2024 0832   TRIG 93.0 03/24/2024 0832   TRIG 44 11/24/2006 0857   HDL 65.10 03/24/2024 0832   CHOLHDL 3 03/24/2024 0832   VLDL 18.6 03/24/2024 0832   LDLCALC 109 (H) 03/24/2024 0832    WEIGHTS: Wt Readings from Last 3 Encounters:  09/04/24 111 lb 9.6 oz (50.6 kg)  03/24/24 113 lb 9.6 oz  (51.5 kg)  03/24/24 114 lb (51.7 kg)    VITALS: BP 122/72   Pulse 63   Ht 5' 4 (1.626 m)   Wt 111 lb 9.6 oz (50.6 kg)   SpO2 100%   BMI 19.16 kg/m   EXAM: General appearance: alert and no distress Lungs: clear to auscultation bilaterally Heart: regular rate and rhythm, S1, S2 normal, no murmur, click, rub or gallop Extremities: extremities normal, atraumatic, no cyanosis or edema Neurologic: Grossly normal  EKG: EKG Interpretation Date/Time:  Monday September 04 2024 08:29:08 EDT Ventricular Rate:  63 PR Interval:  118 QRS Duration:  96 QT Interval:  420 QTC Calculation: 429 R Axis:   82  Text Interpretation: Normal sinus rhythm Incomplete right bundle branch block No previous ECGs available Confirmed by Mona Kent [phone]) on 09/04/2024 8:34:25 AM    ASSESSMENT: Substernal chest pain Elevated LP(a)-145 nmol/L Family history of premature onset coronary artery disease in her father and grandmother Anxiety about health CAC score of 0 (2020)  PLAN: 1.   Mrs. Purington presented today for restratification with an elevated LP(a) but also had an episode last week of substernal chest pain which was concerning for her.  EKG does not show any evidence of acute ischemia or recent infarct however given the symptoms and her increased risk, I think it would be reasonable to proceed with a coronary CT angiogram.  She did have a negative calcium score of 0 in 2020.  She is agreeable to proceed with CT angio and will premedicate with 25 mg metoprolol  the day of the procedure.  Also because of elevated LP(a), I suggest that we should be more aggressive about lipid-lowering and target her LDL to less than 70 because it is a cardiovascular risk factor.  To that end I advised a low-dose statin therapy, likely 5 mg rosuvastatin daily.  She is hesitant to be on any medication even though I explained to her that it could substantially lower her risk.  I will plan to await the findings of her CT  scan and further discussed this recommendation.  Thanks as always for the kind referral.  Kent KYM Mona, MD, [facility], FNLA, FACP  [facility]  Medical Director of the [facility]  Cardiovascular Risk Reduction Clinic Diplomate of the American Board of Clinical Lipidology Attending Cardiologist  Direct Dial: [phone]  Fax: [phone]  Website:  [email or web address] Kent JAYSON Mona 09/04/2024, 8:34 AM

## 2024-09-04 NOTE — Patient Instructions (Addendum)
 Medication Instructions:  NO CHANGES  You have been prescribed a ONE-TIME dose of METOPROLOL  TARTRATE You will take this TWO HOURS before your CT test  *If you need a refill on your cardiac medications before your next appointment, please call your pharmacy*  Testing/Procedures: Coronary CT - you will get a call to schedule once approved with insurance.   Follow-Up: At [facility], you and your health needs are our priority.  As part of our continuing mission to provide you with exceptional heart care, our providers are all part of one team.  This team includes your primary Cardiologist (physician) and Advanced Practice Providers or APPs (Physician Assistants and Nurse Practitioners) who all work together to provide you with the care you need, when you need it.  Your next appointment:    6-8 weeks with Dr. Mona   We recommend signing up for the patient portal called MyChart.  Sign up information is provided on this After Visit Summary.  MyChart is used to connect with patients for Virtual Visits (Telemedicine).  Patients are able to view lab/test results, encounter notes, upcoming appointments, etc.  Non-urgent messages can be sent to your provider as well.   To learn more about what you can do with MyChart, go to [email or web address].   Other Instructions    Your cardiac CT will be scheduled at one of the below locations:   [facility] [address] [place], [place] [address] [phone] (Severe contrast allergies only)  OR   [facility] [address] [place], [place] [address] [phone]  OR   [facility] [address] [place], [place] [address] [phone]  OR   [facility] [address]  [place], [place] [address] [phone]  OR   [facility] [address] [place], [place] [phone]  If scheduled at [facility], please arrive  at the [facility] and Children's Entrance (Entrance C2) of [facility] 30 minutes prior to test start time. You can use the FREE valet parking offered at entrance C (encouraged to control the heart rate for the test)  Proceed to the [facility] Radiology Department (first floor) to check-in and test prep.  All radiology patients and guests should use entrance C2 at [facility], accessed from [facility], even though the hospital's physical address listed is [address]  If scheduled at the Heart and Vascular Tower at [address], please enter the parking lot using the [address] entrance and use the FREE valet service at the patient drop-off area. Enter the building and check-in with registration on the main floor.  If scheduled at [facility], please arrive to the Heart and Vascular Center 15 mins early for check-in and test prep.  There is spacious parking and easy access to the radiology department from the [facility] Heart and Vascular entrance. Please enter here and check-in with the desk attendant.   If scheduled at [facility], please arrive 30 minutes early for check-in and test prep.  Please follow these instructions carefully (unless otherwise directed):  An IV will be required for this test and Nitroglycerin will be given.  Hold all erectile dysfunction medications at least 3 days (72 hrs) prior to test. (Ie viagra, cialis, sildenafil, tadalafil, etc)   On the Night Before the Test: Be sure to Drink plenty of water. Do not consume any caffeinated/decaffeinated beverages or chocolate 12 hours  prior to your test. Do not take any antihistamines 12 hours prior to your test.  On the Day of the Test: Drink plenty of water until 1 hour prior to the test. Do not eat any food 1 hour prior to test. You may take your regular medications prior to the test.  Take metoprolol  (Lopressor ) two hours prior to test. Patients  who wear a continuous glucose monitor MUST remove the device prior to scanning. FEMALES- please wear underwire-free bra if available, avoid dresses & tight clothing  After the Test: Drink plenty of water. After receiving IV contrast, you may experience a mild flushed feeling. This is normal. On occasion, you may experience a mild rash up to 24 hours after the test. This is not dangerous. If this occurs, you can take Benadryl 25 mg, Zyrtec, Claritin, or Allegra and increase your fluid intake. (Patients taking Tikosyn should avoid Benadryl, and may take Zyrtec, Claritin, or Allegra) If you experience trouble breathing, this can be serious. If it is severe call 911 IMMEDIATELY. If it is mild, please call our office.  We will call to schedule your test 2-4 weeks out understanding that some insurance companies will need an authorization prior to the service being performed.   For more information and frequently asked questions, please visit our website : [email or web address]  For non-scheduling related questions, please contact the cardiac imaging nurse navigator should you have any questions/concerns: Cardiac Imaging Nurse Navigators Direct Office Dial: [phone]   For scheduling needs, including cancellations and rescheduling, please call [place], [phone].

## 2024-09-05 ENCOUNTER — Other Ambulatory Visit: Payer: Self-pay | Admitting: Internal Medicine

## 2024-09-06 ENCOUNTER — Other Ambulatory Visit: Payer: Self-pay | Admitting: Internal Medicine

## 2024-09-27 ENCOUNTER — Encounter (HOSPITAL_COMMUNITY): Payer: Self-pay

## 2024-09-29 ENCOUNTER — Ambulatory Visit (HOSPITAL_COMMUNITY)
Admission: RE | Admit: 2024-09-29 | Discharge: 2024-09-29 | Disposition: A | Discharge status: Home / Self Care | Source: Ambulatory Visit | Attending: Internal Medicine | Admitting: Internal Medicine

## 2024-09-29 DIAGNOSIS — Z8249 Family history of ischemic heart disease and other diseases of the circulatory system: Secondary | ICD-10-CM | POA: Diagnosis not present

## 2024-09-29 DIAGNOSIS — R079 Chest pain, unspecified: Secondary | ICD-10-CM | POA: Insufficient documentation

## 2024-09-29 DIAGNOSIS — E7841 Elevated Lipoprotein(a): Secondary | ICD-10-CM | POA: Diagnosis not present

## 2024-09-29 MED ORDER — IOHEXOL 350 MG/ML SOLN
100.0000 mL | Freq: Once | INTRAVENOUS | Status: AC | PRN
Start: 2024-09-29 — End: 2024-09-29
  Administered 2024-09-29: 100 mL via INTRAVENOUS

## 2024-09-29 MED ORDER — NITROGLYCERIN 0.4 MG SL SUBL
0.8000 mg | SUBLINGUAL_TABLET | Freq: Once | SUBLINGUAL | Status: AC
  Administered 2024-09-29: 0.8 mg via SUBLINGUAL

## 2024-10-01 ENCOUNTER — Ambulatory Visit: Payer: Self-pay | Admitting: Internal Medicine

## 2024-10-09 ENCOUNTER — Other Ambulatory Visit: Payer: Self-pay | Admitting: Rheumatology

## 2024-10-09 NOTE — Telephone Encounter (Signed)
 Last Fill: 07/14/2024   Labs: 03/24/2024 alkaline phosphatase 33  Next Visit: 03/23/2025  Last Visit: 03/24/2024  DX: Other osteoporosis without current pathological fracture   Current Dose per office note on 03/24/2024: Fosamax  70 mg 1 tablet by mouth once weekly   Okay to refill Fosamax ?

## 2024-10-27 ENCOUNTER — Encounter: Payer: Self-pay | Admitting: Internal Medicine

## 2024-10-27 ENCOUNTER — Ambulatory Visit: Attending: Internal Medicine | Admitting: Internal Medicine

## 2024-10-27 VITALS — BP 98/70 | HR 74 | Ht 64.0 in | Wt 109.8 lb

## 2024-10-27 DIAGNOSIS — R079 Chest pain, unspecified: Secondary | ICD-10-CM | POA: Diagnosis not present

## 2024-10-27 DIAGNOSIS — E7841 Elevated Lipoprotein(a): Secondary | ICD-10-CM | POA: Diagnosis not present

## 2024-10-27 DIAGNOSIS — I2510 Atherosclerotic heart disease of native coronary artery without angina pectoris: Secondary | ICD-10-CM | POA: Diagnosis not present

## 2024-10-27 NOTE — Progress Notes (Signed)
 LIPID CLINIC CONSULT NOTE  Chief Complaint:  Elevated LP(a)  Primary Care Physician: Jodie Lavern CROME, MD  Primary Cardiologist:  None  HPI:  Christine Ford is a 63 y.o. female who is being seen today for the evaluation of elevated LP(a) at the request of Jodie Lavern CROME, MD. this is a pleasant 63 year old female kindly referred for evaluation and management of cardiovascular risk as well as elevated LP(a).  She reports a strong family history of heart disease including her father who died in his late 48s but had bypass surgery at age 15 and heart attacks prior to that.  There are also other family members with heart disease including his mother.  Ms. Lillibridge notes that she has always had somewhat elevated cholesterol despite eating a low-fat diet, exercising regularly and previously being an avid runner.  She asked to have testing of LP(a) which was found to be elevated at 145 nmol/L.  Recent lipid profile in March 2025 showed total cholesterol 193, triglycerides 93, HDL 65 and LDL 109.  She has not been on any lipid-lowering therapies.  Today she reports that last week she had an episode where she was shopping at [employer] and she had onset of squeezing substernal chest pressure.  She said it felt like food was getting stuck in her chest.  She debated as to whether or not to present to the hospital but she ate some yogurt which eventually provided some relief.  EKG personally reviewed today shows sinus rhythm with an incomplete right bundle branch block.  No clear EKG was available to compare.  Besides her family history and elevated LP(a) she has few traditional risk factors, including no history of hypertension, diabetes, tobacco use or other traditional risk factors.  10/27/2024  Ms. Defrancesco returns today for follow-up.  She reports her chest pain is generally resolved although she does get some intermittent discomfort which sounds like chest wall pain and might be related to stress.   She underwent CT coronary angiography which showed no coronary calcium or plaque and essentially no coronary artery disease.  Overall this is reassuring however her LP(a) is elevated.  Cholesterol is still above target in my opinion and I would advise reduction in LDL to goal less than 70.  She thinks that she can do this ideally more with diet and lifestyle modifications.  She says she is not very active but might consider starting to exercise more  PMHx:  Past Medical History:  Diagnosis Date   Anxiety    Arthritis    Family history of dementia, mother, age > 71 07/28/2017   Osteopenia    Temporomandibular joint disorder (TMJ)     Past Surgical History:  Procedure Laterality Date   BREAST SURGERY     dental implant  2022   WISDOM TOOTH EXTRACTION      FAMHx:  Family History  Problem Relation Age of Onset   Coronary artery disease Mother 39   Hypertension Mother    Osteoporosis Mother    COPD Mother    Alzheimer's disease Mother    Hypertension Father    Heart attack Father 34   Heart disease Father    Glaucoma Son    Healthy Son    Breast cancer Other    Lung cancer Other    Lung cancer Maternal Aunt    Heart disease Sister    Healthy Son    Lung cancer Cousin    Breast cancer Cousin     SOCHx:  reports that she has never smoked. She has been exposed to tobacco smoke. She has never used smokeless tobacco. She reports current alcohol use. She reports that she does not use drugs.  ALLERGIES:  No Known Allergies  ROS: Pertinent items noted in HPI and remainder of comprehensive ROS otherwise negative.  HOME MEDS: Current Outpatient Medications on File Prior to Visit  Medication Sig Dispense Refill   alendronate  (FOSAMAX ) 70 MG tablet TAKE 1 TABLET(70 MG) BY MOUTH 1 TIME A WEEK WITH A FULL GLASS OF WATER AND ON AN EMPTY STOMACH 12 tablet 0   buPROPion  (WELLBUTRIN  XL) 150 MG 24 hr tablet Take 1 tablet (150 mg total) by mouth daily. 90 tablet 3   Cholecalciferol  (VITAMIN D3) 1000 units CAPS      metoprolol  tartrate (LOPRESSOR ) 25 MG tablet Take 1 tablet (25 mg total) by mouth as directed. Take TWO HOURS prior to CT test 1 tablet 0   Multiple Vitamins-Minerals (MULTIVITAMIN,TX-MINERALS) tablet Take 1 tablet by mouth daily.     No current facility-administered medications on file prior to visit.    LABS/IMAGING: No results found for this or any previous visit (from the past 48 hours). No results found.  LIPID PANEL:    Component Value Date/Time   CHOL 193 03/24/2024 0832   TRIG 93.0 03/24/2024 0832   TRIG 44 11/24/2006 0857   HDL 65.10 03/24/2024 0832   CHOLHDL 3 03/24/2024 0832   VLDL 18.6 03/24/2024 0832   LDLCALC 109 (H) 03/24/2024 0832    WEIGHTS: Wt Readings from Last 3 Encounters:  10/27/24 109 lb 12.8 oz (49.8 kg)  09/04/24 111 lb 9.6 oz (50.6 kg)  03/24/24 113 lb 9.6 oz (51.5 kg)    VITALS: BP 98/70   Pulse 74   Ht 5' 4 (1.626 m)   Wt 109 lb 12.8 oz (49.8 kg)   SpO2 98%   BMI 18.85 kg/m   EXAM: Deferred  EKG: Deferred  ASSESSMENT: Substernal chest pain -normal coronary CT (09/2024) Elevated LP(a)-145 nmol/L Family history of premature onset coronary artery disease in her father and grandmother Anxiety about health CAC score of 0 (2020)  PLAN: 1.   Mrs. Suell had a normal coronary CT with no calcium or plaque detected.  She does have an elevated LP(a).  Based on that I think additional lipid-lowering is recommended.  She is hesitant to take a statin but I would advise then considering dietary and lifestyle modifications including increased exercise, decrease saturated fats, consider adding CholestOff and/or red yeast rice to her diet.  Will plan repeat lipids in 6 months.  She can follow-up with Rosaline Bane at [facility].  If she has not reached target that point I will consider low-dose rosuvastatin 5 mg daily.  Plan ultimately is to try and arrange for follow-up with her primary care provider.  Vinie KYM Maxcy, MD, [facility], FNLA, FACP  [place]  [facility]  Medical Director of the Advanced Lipid Disorders &  Cardiovascular Risk Reduction Clinic Diplomate of the American Board of Clinical Lipidology Attending Cardiologist  Direct Dial: [phone]  Fax: [phone]  Website:  [email or web address]  Vinie BROCKS Mischell [place] 10/27/2024, 9:14 AM

## 2024-10-27 NOTE — Patient Instructions (Addendum)
 Medication Instructions:  No changes-  Supplements to look into: Red Yeast Rice Cholestoff  Eat a diet low in saturated fats Get 150 min of exercise a week *If you need a refill on your cardiac medications before your next appointment, please call your pharmacy*  Lab Work: Fasting Lipid panel in 6 months If you have labs (blood work) drawn today and your tests are completely normal, you will receive your results only by: MyChart Message (if you have MyChart) OR A paper copy in the mail If you have any lab test that is abnormal or we need to change your treatment, we will call you to review the results.  Testing/Procedures: None ordered  Follow-Up: At [facility], you and your health needs are our priority.  As part of our continuing mission to provide you with exceptional heart care, our providers are all part of one team.  This team includes your primary Cardiologist (physician) and Advanced Practice Providers or APPs (Physician Assistants and Nurse Practitioners) who all work together to provide you with the care you need, when you need it.  Your next appointment:   6 month(s)  Provider:   Rosaline Bane, NP  We recommend signing up for the patient portal called MyChart.  Sign up information is provided on this After Visit Summary.  MyChart is used to connect with patients for Virtual Visits (Telemedicine).  Patients are able to view lab/test results, encounter notes, upcoming appointments, etc.  Non-urgent messages can be sent to your provider as well.   To learn more about what you can do with MyChart, go to [email or web address].

## 2024-10-30 ENCOUNTER — Encounter: Payer: Self-pay | Admitting: Internal Medicine

## 2024-11-09 DIAGNOSIS — H9193 Unspecified hearing loss, bilateral: Secondary | ICD-10-CM | POA: Diagnosis not present

## 2024-11-09 DIAGNOSIS — H608X3 Other otitis externa, bilateral: Secondary | ICD-10-CM | POA: Diagnosis not present

## 2024-11-09 DIAGNOSIS — H6063 Unspecified chronic otitis externa, bilateral: Secondary | ICD-10-CM | POA: Diagnosis not present

## 2024-11-20 NOTE — Telephone Encounter (Signed)
 Patient calling about referral for Dr Tobie that was to be placed by our office.   [phone]    Thank you

## 2024-11-20 NOTE — Telephone Encounter (Signed)
 I spoke with the patient and answered her questions. Electronically signed by: Jon FABIENE Sharps 11/20/2024 10:55 AM

## 2024-11-27 NOTE — Telephone Encounter (Signed)
 Patient is calling for Angie.  Phone is [phone]  She said she's been talking with Angie

## 2024-11-27 NOTE — Telephone Encounter (Signed)
 I spoke with the patient and gave her the information needed. Electronically signed by: Jon FABIENE Sharps 11/27/2024 4:37 PM

## 2024-11-30 ENCOUNTER — Telehealth: Payer: Self-pay | Admitting: *Deleted

## 2024-11-30 ENCOUNTER — Other Ambulatory Visit: Payer: Self-pay | Admitting: *Deleted

## 2024-11-30 DIAGNOSIS — Z5181 Encounter for therapeutic drug level monitoring: Secondary | ICD-10-CM

## 2024-11-30 DIAGNOSIS — E559 Vitamin D deficiency, unspecified: Secondary | ICD-10-CM

## 2024-11-30 DIAGNOSIS — M818 Other osteoporosis without current pathological fracture: Secondary | ICD-10-CM | POA: Diagnosis not present

## 2024-11-30 NOTE — Telephone Encounter (Signed)
 Patient contacted the office and asked when her next bone density scan is due. Patient advised her next bone density scan is due April 2026. Patient asked how long she has been on Fosamax . Patient advised since December 2024. Patient advised she is due for labs now and will come by today to update them.

## 2024-12-01 ENCOUNTER — Ambulatory Visit: Payer: Self-pay | Admitting: Rheumatology

## 2024-12-01 LAB — CBC WITH DIFFERENTIAL/PLATELET
Absolute Lymphocytes: 1680 {cells}/uL (ref 850–3900)
Absolute Monocytes: 450 {cells}/uL (ref 200–950)
Basophils Absolute: 50 {cells}/uL (ref 0–200)
Basophils Relative: 1 %
Eosinophils Absolute: 90 {cells}/uL (ref 15–500)
Eosinophils Relative: 1.8 %
HCT: 39.7 % (ref 35.9–46.0)
Hemoglobin: 13.4 g/dL (ref 11.7–15.5)
MCH: 32 pg (ref 27.0–33.0)
MCHC: 33.8 g/dL (ref 31.6–35.4)
MCV: 94.7 fL (ref 81.4–101.7)
MPV: 10.5 fL (ref 7.5–12.5)
Monocytes Relative: 9 %
Neutro Abs: 2730 {cells}/uL (ref 1500–7800)
Neutrophils Relative %: 54.6 %
Platelets: 242 Thousand/uL (ref 140–400)
RBC: 4.19 Million/uL (ref 3.80–5.10)
RDW: 12.5 % (ref 11.0–15.0)
Total Lymphocyte: 33.6 %
WBC: 5 Thousand/uL (ref 3.8–10.8)

## 2024-12-01 LAB — VITAMIN D 25 HYDROXY (VIT D DEFICIENCY, FRACTURES): Vit D, 25-Hydroxy: 67 ng/mL (ref 30–100)

## 2024-12-01 LAB — COMPREHENSIVE METABOLIC PANEL WITH GFR
AG Ratio: 1.6 (calc) (ref 1.0–2.5)
ALT: 14 U/L (ref 6–29)
AST: 26 U/L (ref 10–35)
Albumin: 4.5 g/dL (ref 3.6–5.1)
Alkaline phosphatase (APISO): 34 U/L — ABNORMAL LOW (ref 37–153)
BUN: 18 mg/dL (ref 7–25)
CO2: 23 mmol/L (ref 20–32)
Calcium: 9.7 mg/dL (ref 8.6–10.4)
Chloride: 102 mmol/L (ref 98–110)
Creat: 0.8 mg/dL (ref 0.50–1.05)
Globulin: 2.8 g/dL (ref 1.9–3.7)
Glucose, Bld: 74 mg/dL (ref 65–99)
Potassium: 4.1 mmol/L (ref 3.5–5.3)
Sodium: 142 mmol/L (ref 135–146)
Total Bilirubin: 0.7 mg/dL (ref 0.2–1.2)
Total Protein: 7.3 g/dL (ref 6.1–8.1)
eGFR: 83 mL/min/1.73m2 (ref >=60)

## 2024-12-01 NOTE — Progress Notes (Signed)
CBC, CMP and vitamin D are within normal limits.

## 2025-01-04 ENCOUNTER — Other Ambulatory Visit: Payer: Self-pay | Admitting: Rheumatology

## 2025-01-04 NOTE — Telephone Encounter (Signed)
 Last Fill: 10/09/2024  Labs: 11/30/2024 CBC, CMP and vitamin D  are within normal limits.   Next Visit: 03/23/2025  Last Visit: 03/24/2024  DX: Other osteoporosis without current pathological fracture   Current Dose per office note on 03/24/2024: Fosamax  70 mg 1 tablet by mouth once weekly   Okay to refill Fosamax ?

## 2025-01-09 ENCOUNTER — Other Ambulatory Visit: Payer: Self-pay | Admitting: Rheumatology

## 2025-01-24 ENCOUNTER — Telehealth: Payer: Self-pay | Admitting: Family Medicine

## 2025-01-24 NOTE — Telephone Encounter (Signed)
 LVM to r/s appt from 03/26/25

## 2025-03-12 ENCOUNTER — Institutional Professional Consult (permissible substitution) (INDEPENDENT_AMBULATORY_CARE_PROVIDER_SITE_OTHER): Admitting: Otolaryngology

## 2025-03-23 ENCOUNTER — Ambulatory Visit: Admitting: Rheumatology

## 2025-03-26 ENCOUNTER — Encounter: Admitting: Family Medicine

## 2025-04-16 ENCOUNTER — Encounter: Admitting: Family Medicine
